# Patient Record
Sex: Female | Born: 1950
Health system: Southern US, Community
[De-identification: ages and names within clinical notes are randomized; demographics above are authoritative.]

## PROBLEM LIST (undated history)

## (undated) DIAGNOSIS — I10 Essential (primary) hypertension: Secondary | ICD-10-CM

## (undated) DIAGNOSIS — E0590 Thyrotoxicosis, unspecified without thyrotoxic crisis or storm: Secondary | ICD-10-CM

## (undated) DIAGNOSIS — E785 Hyperlipidemia, unspecified: Secondary | ICD-10-CM

## (undated) DIAGNOSIS — M1990 Unspecified osteoarthritis, unspecified site: Secondary | ICD-10-CM

## (undated) HISTORY — DX: Essential (primary) hypertension: I10

## (undated) HISTORY — PX: POLYPECTOMY: SHX149

## (undated) HISTORY — DX: Hyperlipidemia, unspecified: E78.5

## (undated) HISTORY — PX: COLONOSCOPY: SHX174

---

## 1998-01-21 ENCOUNTER — Ambulatory Visit: Admission: RE | Admit: 1998-01-21 | Discharge: 1998-01-21 | Payer: Self-pay | Admitting: *Deleted

## 1998-03-02 ENCOUNTER — Encounter: Admission: RE | Admit: 1998-03-02 | Discharge: 1998-03-02 | Payer: Self-pay | Admitting: Hematology and Oncology

## 1998-03-03 ENCOUNTER — Ambulatory Visit: Admission: RE | Admit: 1998-03-03 | Discharge: 1998-03-03 | Payer: Self-pay | Admitting: *Deleted

## 1998-03-03 ENCOUNTER — Encounter: Admission: RE | Admit: 1998-03-03 | Discharge: 1998-03-03 | Payer: Self-pay | Admitting: Hematology and Oncology

## 1998-04-13 ENCOUNTER — Encounter: Admission: RE | Admit: 1998-04-13 | Discharge: 1998-04-13 | Payer: Self-pay | Admitting: Obstetrics & Gynecology

## 1998-04-13 ENCOUNTER — Other Ambulatory Visit: Admission: RE | Admit: 1998-04-13 | Discharge: 1998-04-13 | Payer: Self-pay | Admitting: Obstetrics

## 1999-01-31 ENCOUNTER — Emergency Department (HOSPITAL_COMMUNITY): Admission: EM | Admit: 1999-01-31 | Discharge: 1999-01-31 | Payer: Self-pay | Admitting: Emergency Medicine

## 2000-08-17 ENCOUNTER — Other Ambulatory Visit: Admission: RE | Admit: 2000-08-17 | Discharge: 2000-08-17 | Payer: Self-pay | Admitting: Gynecology

## 2002-01-03 ENCOUNTER — Other Ambulatory Visit: Admission: RE | Admit: 2002-01-03 | Discharge: 2002-01-03 | Payer: Self-pay | Admitting: Gynecology

## 2004-11-09 ENCOUNTER — Other Ambulatory Visit: Admission: RE | Admit: 2004-11-09 | Discharge: 2004-11-09 | Payer: Self-pay | Admitting: Gynecology

## 2004-12-01 ENCOUNTER — Ambulatory Visit: Payer: Self-pay

## 2009-07-26 ENCOUNTER — Ambulatory Visit: Payer: Self-pay | Admitting: Gastroenterology

## 2009-08-06 ENCOUNTER — Telehealth: Payer: Self-pay | Admitting: Gastroenterology

## 2009-08-09 ENCOUNTER — Encounter: Payer: Self-pay | Admitting: Gastroenterology

## 2009-08-09 ENCOUNTER — Ambulatory Visit: Payer: Self-pay | Admitting: Gastroenterology

## 2009-08-10 ENCOUNTER — Encounter: Payer: Self-pay | Admitting: Gastroenterology

## 2010-11-24 NOTE — Procedures (Signed)
Summary: Colonoscopy  Patient: [name] Note: All result statuses are Final unless otherwise noted.  Tests: (1) Colonoscopy (COL)   COL Colonoscopy           DONE     [facility]     [address]     [place], [place]  [address]           COLONOSCOPY PROCEDURE REPORT           PATIENT:  Erica Navarro, Erica Navarro  MR#:  [id number]     BIRTHDATE:  [birth year], 57 yrs. old  GENDER:  female           ENDOSCOPIST:  Vania Rea. Jarold Motto, MD, [facility]     Referred by:  Lesle Chris, M.D.           PROCEDURE DATE:  08/09/2009     PROCEDURE:  Colonoscopy with snare polypectomy     ASA CLASS:  Class II     INDICATIONS:  Routine Risk Screening           MEDICATIONS:   Fentanyl 50 mcg IV, Versed 6 mg IV           DESCRIPTION OF PROCEDURE:   After the risks benefits and     alternatives of the procedure were thoroughly explained, informed     consent was obtained.  Digital rectal exam was performed and     revealed no abnormalities.   The LB CF-[id number] endoscope     was introduced through the anus and advanced to the cecum, which     was identified by both the appendix and ileocecal valve, without     limitations.  The quality of the prep was excellent, using     MoviPrep.  The instrument was then slowly withdrawn as the colon     was fully examined.     <<PROCEDUREIMAGES>>           FINDINGS:  There were multiple polyps identified and removed. in     the rectum and sigmoid colon. They were diminutive. Polyps were     snared, then cauterized with monopolar cautery. Retrieval was     successful. snare polyp  This was otherwise a normal examination     of the colon.   Retroflexed views in the rectum revealed no     abnormalities.    The scope was then withdrawn from the patient     and the procedure completed.           COMPLICATIONS:  None           ENDOSCOPIC IMPRESSION:     1) Polyps, multiple in the rectum and sigmoid colon     2) Otherwise normal examination     probable  hyperplastic polyps.     RECOMMENDATIONS:     1) Follow up colonoscopy in 5 years           REPEAT EXAM:  No           ______________________________     Vania Rea. Jarold Motto, MD, [facility]           CC:           n.     eSIGNED:   Vania Rea. Rasool Rommel at 08/09/2009 09:52 AM           Areta Haber, [id number]  Note: An exclamation mark (!) indicates a result that was not dispersed into the flowsheet. Document Creation Date: 08/09/2009 9:52 AM _______________________________________________________________________  Marland Kitchen  1) Order result status: Final Collection or observation date-time: 08/09/2009 09:42 Requested date-time:  Receipt date-time:  Reported date-time:  Referring Physician:   Ordering Physician: Sheryn Bison [phone]) Specimen Source:  Source: Launa Grill Order Number: [id number] Lab site:   Appended Document: Colonoscopy     Procedures Next Due Date:    Colonoscopy: 07/2012

## 2010-11-24 NOTE — Progress Notes (Signed)
Summary: ? re procedure  Phone Note Call from Patient Call back at [phone]   Caller: Patient Call For: Jarold Motto Reason for Call: Talk to Nurse Summary of Call: Patient has question regarding procedure .... Initial call taken by: Tawni Levy,  August 06, 2009 10:06 AM  Follow-up for Phone Call        Patient asked if she could eat solids the day before her procedure. I explained that she needed to have only clear liquids that day, and that she could have clear liquids til 2 hrs before her procedure. Follow-up by: Clide Cliff RN,  August 06, 2009 12:34 PM

## 2010-11-24 NOTE — Letter (Signed)
Summary: Patient Notice- Polyp Results  [facility]  [address] [place], [place] [address]   Phone: [phone]  Fax: [phone]        August 10, 2009 MRN: [id number]    Erica Navarro [address] [place], [place]  [address]    Dear [name],  I am pleased to inform you that the colon polyp(s) removed during your recent colonoscopy was (were) found to be benign (no cancer detected) upon pathologic examination.  I recommend you have a repeat colonoscopy examination in 3 years to look for recurrent polyps, as having colon polyps increases your risk for having recurrent polyps or even colon cancer in the future. Your polyps were adenomatous polyps and I would suggest followup in 3 years instead of 5.  Should you develop new or worsening symptoms of abdominal pain, bowel habit changes or bleeding from the rectum or bowels, please schedule an evaluation with either your primary care physician or with me.  Additional information/recommendations:  __ No further action with gastroenterology is needed at this time. Please      follow-up with your primary care physician for your other healthcare      needs.  __ Please call [phone] to schedule a return visit to review your      situation.  __ Please keep your follow-up visit as already scheduled.  _xx_ Continue treatment plan as outlined the day of your exam.  Please call us if you are having persistent problems or have questions about your condition that have not been fully answered at this time.  Sincerely,  Mardella Layman MD [facility]  This letter has been electronically signed by your physician.

## 2010-11-24 NOTE — Miscellaneous (Signed)
Summary: [facility]  Clinical Lists Changes  Medications: Added new medication of MOVIPREP 100 GM  SOLR (PEG-KCL-NACL-NASULF-NA ASC-C) As per prep instructions. - Signed Rx of MOVIPREP 100 GM  SOLR (PEG-KCL-NACL-NASULF-NA ASC-C) As per prep instructions.;  #1 x 0;  Signed;  Entered by: Ezra Sites RN;  Authorized by: Mardella Layman MD [facility];  Method used: Electronically to [facility]. [phone]*, [address] [place], [place]  [address], Ph: [phone], Fax: [phone] Allergies: Added new allergy or adverse reaction of * LISINOPRIL Observations: Added new observation of NKA: F (07/26/2009 14:13)    Prescriptions: MOVIPREP 100 GM  SOLR (PEG-KCL-NACL-NASULF-NA ASC-C) As per prep instructions.  #1 x 0   Entered by:   Ezra Sites RN   Authorized by:   Mardella Layman MD [facility]   Signed by:   Ezra Sites RN on 07/26/2009   Method used:   Electronically to        [facility] [address] [phone]* (retail)       [address] [place], [place]  [address]       Ph: [phone]       Fax: [phone]   RxID:   [id number]

## 2012-08-05 ENCOUNTER — Encounter: Payer: Self-pay | Admitting: Gastroenterology

## 2012-09-28 ENCOUNTER — Other Ambulatory Visit: Payer: Self-pay | Admitting: Emergency Medicine

## 2012-09-28 NOTE — Telephone Encounter (Signed)
Please pull paper chart.  

## 2012-09-30 NOTE — Telephone Encounter (Signed)
Chart pulled to PA pool [id number]

## 2012-10-01 ENCOUNTER — Other Ambulatory Visit: Payer: Self-pay | Admitting: *Deleted

## 2012-10-01 MED ORDER — PRAVASTATIN SODIUM 40 MG PO TABS: 40.0000 mg | ORAL_TABLET | Freq: Every day | ORAL | Status: DC

## 2012-11-11 ENCOUNTER — Ambulatory Visit: Payer: Self-pay | Admitting: Family Medicine

## 2012-11-11 VITALS — BP 185/89 | HR 75 | Temp 98.2°F | Resp 17 | Ht 63.0 in | Wt 169.0 lb

## 2012-11-11 DIAGNOSIS — I10 Essential (primary) hypertension: Secondary | ICD-10-CM | POA: Insufficient documentation

## 2012-11-11 DIAGNOSIS — E7800 Pure hypercholesterolemia, unspecified: Secondary | ICD-10-CM | POA: Insufficient documentation

## 2012-11-11 LAB — COMPREHENSIVE METABOLIC PANEL
ALT: 10 U/L (ref 0–35)
AST: 17 U/L (ref 0–37)
Albumin: 4.4 g/dL (ref 3.5–5.2)
Alkaline Phosphatase: 101 U/L (ref 39–117)
BUN: 15 mg/dL (ref 6–23)
CO2: 27 mEq/L (ref 19–32)
Calcium: 10.4 mg/dL (ref 8.4–10.5)
Chloride: 103 mEq/L (ref 96–112)
Creat: 0.63 mg/dL (ref 0.50–1.10)
Glucose, Bld: 109 mg/dL — ABNORMAL HIGH (ref 70–99)
Potassium: 4.5 mEq/L (ref 3.5–5.3)
Sodium: 141 mEq/L (ref 135–145)
Total Bilirubin: 0.6 mg/dL (ref 0.3–1.2)
Total Protein: 7.5 g/dL (ref 6.0–8.3)

## 2012-11-11 MED ORDER — AMLODIPINE BESYLATE 10 MG PO TABS: 10.0000 mg | ORAL_TABLET | Freq: Every day | ORAL | Status: DC

## 2012-11-11 MED ORDER — PRAVASTATIN SODIUM 40 MG PO TABS: 40.0000 mg | ORAL_TABLET | Freq: Every day | ORAL | Status: DC

## 2012-11-11 NOTE — Progress Notes (Signed)
[facility] [address], [place] [address] [phone]  Date:  11/11/2012   Name:  Erica Navarro   DOB:  04/07/1951   MRN:  [id number]  PCP:  Sheryn Bison, MD    Chief Complaint: Medication Refill   History of Present Illness:  Erica Navarro is a 62 y.o. very pleasant female patient who presents with the following:  She has been out of her medications for one week- she takes medications for her BP and for her cholesterol.   She would check her BP when she was on her medication- most recent 130/ 70s.    She has not had any labs in some time- she has financial stressors at this time.    She has not noted any symptoms of HTN- no CP, no HA She has not had a flu shot this year and refused one today There is no problem list on file for this patient.   History reviewed. No pertinent past medical history.  History reviewed. No pertinent past surgical history.  History  Substance Use Topics  . Smoking status: Never Smoker   . Smokeless tobacco: Not on file  . Alcohol Use: No    History reviewed. No pertinent family history.  Allergies  Allergen Reactions  . Lisinopril     REACTION: hands and face swell    Medication list has been reviewed and updated.  Current Outpatient Prescriptions on File Prior to Visit  Medication Sig Dispense Refill  . amLODipine (NORVASC) 10 MG tablet Take 1 tablet (10 mg total) by mouth daily. Needs office visit  30 tablet  0  . pravastatin (PRAVACHOL) 40 MG tablet Take 1 tablet (40 mg total) by mouth daily. Needs office visit  30 tablet  0    Review of Systems:  As per HPI- otherwise negative.   Physical Examination: Filed Vitals:   11/11/12 0908  BP: 185/89  Pulse: 75  Temp: 98.2 F (36.8 C)  Resp: 17   Filed Vitals:   11/11/12 0908  Height: 5\' 3"  (1.6 m)  Weight: 169 lb (76.658 kg)   Body mass index is 29.94 kg/(m^2). Ideal Body Weight: Weight in (lb) to have BMI = 25: 140.8   GEN: WDWN, NAD,  Non-toxic, A & O x 3 HEENT: Atraumatic, Normocephalic. Neck supple. No masses, No LAD. Ears and Nose: No external deformity. CV: RRR, No M/G/R. No JVD. No thrill. No extra heart sounds. PULM: CTA B, no wheezes, crackles, rhonchi. No retractions. No resp. distress. No accessory muscle use. ABD: S, NT, ND EXTR: No c/c/e NEURO Normal gait.  PSYCH: Normally interactive. Conversant. Not depressed or anxious appearing.  Calm demeanor.    Assessment and Plan: 1. HTN (hypertension)  amLODipine (NORVASC) 10 MG tablet, Comprehensive metabolic panel  2. High cholesterol  pravastatin (PRAVACHOL) 40 MG tablet, Comprehensive metabolic panel   Restart her CHL and BP medications today.  Check CMP.  Will defer FLP to another day- will be more accurate when she is back on her CHL medication.  Plan to recheck in a few months, sooner if any concerns.    Abbe Amsterdam, MD

## 2012-11-11 NOTE — Patient Instructions (Addendum)
I will be in touch with your labs- let us know if your blood pressure does not respond to your medication.  Let's try to recheck in about 6 months and check your cholesterol then.

## 2013-02-24 ENCOUNTER — Encounter: Payer: Self-pay | Admitting: Gastroenterology

## 2013-11-10 ENCOUNTER — Telehealth: Payer: Self-pay | Admitting: Emergency Medicine

## 2013-11-10 ENCOUNTER — Ambulatory Visit: Payer: Self-pay | Admitting: Emergency Medicine

## 2013-11-10 VITALS — BP 130/80 | HR 71 | Temp 98.3°F | Resp 16 | Ht 63.0 in | Wt 175.0 lb

## 2013-11-10 DIAGNOSIS — E7800 Pure hypercholesterolemia, unspecified: Secondary | ICD-10-CM

## 2013-11-10 DIAGNOSIS — I10 Essential (primary) hypertension: Secondary | ICD-10-CM

## 2013-11-10 LAB — LIPID PANEL
Cholesterol: 224 mg/dL — ABNORMAL HIGH (ref 0–200)
HDL: 70 mg/dL (ref >39)
LDL Cholesterol: 141 mg/dL — ABNORMAL HIGH (ref 0–99)
Total CHOL/HDL Ratio: 3.2 Ratio
Triglycerides: 63 mg/dL (ref <150)
VLDL: 13 mg/dL (ref 0–40)

## 2013-11-10 LAB — POCT CBC
Granulocyte percent: 58.8 %G (ref 37–80)
HCT, POC: 42.4 % (ref 37.7–47.9)
Hemoglobin: 13.4 g/dL (ref 12.2–16.2)
Lymph, poc: 1.3 (ref 0.6–3.4)
MCH, POC: 28.3 pg (ref 27–31.2)
MCHC: 31.6 g/dL — AB (ref 31.8–35.4)
MCV: 89.5 fL (ref 80–97)
MID (cbc): 0.3 (ref 0–0.9)
MPV: 10.2 fL (ref 0–99.8)
POC Granulocyte: 2.2 (ref 2–6.9)
POC LYMPH PERCENT: 34.6 %L (ref 10–50)
POC MID %: 6.6 %M (ref 0–12)
Platelet Count, POC: 261 10*3/uL (ref 142–424)
RBC: 4.74 M/uL (ref 4.04–5.48)
RDW, POC: 14.8 %
WBC: 3.8 10*3/uL — AB (ref 4.6–10.2)

## 2013-11-10 LAB — COMPREHENSIVE METABOLIC PANEL
ALT: 13 U/L (ref 0–35)
AST: 21 U/L (ref 0–37)
Albumin: 4.3 g/dL (ref 3.5–5.2)
Alkaline Phosphatase: 110 U/L (ref 39–117)
BUN: 16 mg/dL (ref 6–23)
CO2: 26 mEq/L (ref 19–32)
Calcium: 10.5 mg/dL (ref 8.4–10.5)
Chloride: 102 mEq/L (ref 96–112)
Creat: 0.54 mg/dL (ref 0.50–1.10)
Glucose, Bld: 102 mg/dL — ABNORMAL HIGH (ref 70–99)
Potassium: 4 mEq/L (ref 3.5–5.3)
Sodium: 137 mEq/L (ref 135–145)
Total Bilirubin: 0.4 mg/dL (ref 0.3–1.2)
Total Protein: 7.7 g/dL (ref 6.0–8.3)

## 2013-11-10 MED ORDER — AMLODIPINE BESYLATE 10 MG PO TABS: 10.0000 mg | ORAL_TABLET | Freq: Every day | ORAL | Status: DC

## 2013-11-10 MED ORDER — PRAVASTATIN SODIUM 40 MG PO TABS: 40.0000 mg | ORAL_TABLET | Freq: Every day | ORAL | Status: DC

## 2013-11-10 NOTE — Telephone Encounter (Signed)
Please call patient n cholesterol is still high despite being on cholesterol medication. Please be sure she takes her medication every day she works on weight loss and watches her diet regarding cholesterol intake.

## 2013-11-10 NOTE — Progress Notes (Signed)
   Subjective:    Patient ID: Erica Navarro, female    DOB: 1951-10-19, 63 y.o.   MRN: [id number]  HPI  Patient comes into our office today with needs of blood work so she can get a refill on her Amlodipine and Pravastatin She looks well, she states that she feels well , she seems like a friendly lady She has been keeping check on her blood pressure last reading was last week in was 127/80 she has a blood pressure monitor at home.She hasn't ran out of medicine just yet. Was giving a years worth of refills when she saw Dr Lorelei Pont No Hx of past medical history No HX of surgeries Need to get a mammogram this year Didn't get flu shot this year was offered she denied No insurance but will work on getting one lost job back in 06-2012    Review of Systems     Objective:   Physical Exam patient is alert and cooperative she is in no distress. Her neck is supple. Her chest was clear. Heart exam reveals a regular rate with a click noted no murmurs were heard. Extremities are without edema.  Results for orders placed in visit on 11/10/13  POCT CBC      Result Value Range   WBC 3.8 (*) 4.6 - 10.2 K/uL   Lymph, poc 1.3  0.6 - 3.4   POC LYMPH PERCENT 34.6  10 - 50 %L   MID (cbc) 0.3  0 - 0.9   POC MID % 6.6  0 - 12 %M   POC Granulocyte 2.2  2 - 6.9   Granulocyte percent 58.8  37 - 80 %G   RBC 4.74  4.04 - 5.48 M/uL   Hemoglobin 13.4  12.2 - 16.2 g/dL   HCT, POC 42.4  37.7 - 47.9 %   MCV 89.5  80 - 97 fL   MCH, POC 28.3  27 - 31.2 pg   MCHC 31.6 (*) 31.8 - 35.4 g/dL   RDW, POC 14.8     Platelet Count, POC 261  142 - 424 K/uL   MPV 10.2  0 - 99.8 fL        Assessment & Plan:  I advised her to have a mammogram. I advised her that the sooner she is financially able she needs to have a good complete physical exam so she can get up to date on her preventative health care.

## 2013-11-12 NOTE — Telephone Encounter (Signed)
Pt is taking medication each day. Advised her to start an exercise regimen and watch her diet to reduce cholesterol intake. Pt will rtn in 3 months for a recheck.

## 2014-07-24 ENCOUNTER — Encounter: Payer: Self-pay | Admitting: Gastroenterology

## 2014-11-10 ENCOUNTER — Encounter: Payer: Self-pay | Admitting: Family Medicine

## 2014-11-10 ENCOUNTER — Ambulatory Visit (INDEPENDENT_AMBULATORY_CARE_PROVIDER_SITE_OTHER): Payer: Commercial Managed Care - HMO | Admitting: Internal Medicine

## 2014-11-10 VITALS — BP 108/76 | HR 88 | Temp 98.3°F | Resp 16 | Ht 64.0 in | Wt 177.2 lb

## 2014-11-10 DIAGNOSIS — I10 Essential (primary) hypertension: Secondary | ICD-10-CM

## 2014-11-10 DIAGNOSIS — E7800 Pure hypercholesterolemia, unspecified: Secondary | ICD-10-CM

## 2014-11-10 DIAGNOSIS — E785 Hyperlipidemia, unspecified: Secondary | ICD-10-CM

## 2014-11-10 DIAGNOSIS — Z7189 Other specified counseling: Secondary | ICD-10-CM

## 2014-11-10 LAB — COMPREHENSIVE METABOLIC PANEL
ALT: 12 U/L (ref 0–35)
AST: 17 U/L (ref 0–37)
Albumin: 4.5 g/dL (ref 3.5–5.2)
Alkaline Phosphatase: 111 U/L (ref 39–117)
BUN: 14 mg/dL (ref 6–23)
CO2: 28 mEq/L (ref 19–32)
Calcium: 10.8 mg/dL — ABNORMAL HIGH (ref 8.4–10.5)
Chloride: 102 mEq/L (ref 96–112)
Creat: 0.65 mg/dL (ref 0.50–1.10)
Glucose, Bld: 99 mg/dL (ref 70–99)
Potassium: 4.6 mEq/L (ref 3.5–5.3)
Sodium: 139 mEq/L (ref 135–145)
Total Bilirubin: 0.6 mg/dL (ref 0.2–1.2)
Total Protein: 8.2 g/dL (ref 6.0–8.3)

## 2014-11-10 LAB — POCT CBC
Granulocyte percent: 56.9 %G (ref 37–80)
HCT, POC: 42.8 % (ref 37.7–47.9)
Hemoglobin: 14 g/dL (ref 12.2–16.2)
Lymph, poc: 2 (ref 0.6–3.4)
MCH, POC: 28.3 pg (ref 27–31.2)
MCHC: 32.7 g/dL (ref 31.8–35.4)
MCV: 86.5 fL (ref 80–97)
MID (cbc): 0.3 (ref 0–0.9)
MPV: 8.7 fL (ref 0–99.8)
POC Granulocyte: 3.1 (ref 2–6.9)
POC LYMPH PERCENT: 37.5 %L (ref 10–50)
POC MID %: 5.6 %M (ref 0–12)
Platelet Count, POC: 282 10*3/uL (ref 142–424)
RBC: 4.95 M/uL (ref 4.04–5.48)
RDW, POC: 15.3 %
WBC: 5.4 10*3/uL (ref 4.6–10.2)

## 2014-11-10 LAB — LIPID PANEL
Cholesterol: 221 mg/dL — ABNORMAL HIGH (ref 0–200)
HDL: 63 mg/dL (ref >39)
LDL Cholesterol: 140 mg/dL — ABNORMAL HIGH (ref 0–99)
Total CHOL/HDL Ratio: 3.5 Ratio
Triglycerides: 89 mg/dL (ref <150)
VLDL: 18 mg/dL (ref 0–40)

## 2014-11-10 LAB — TSH: TSH: 1.358 u[IU]/mL (ref 0.350–4.500)

## 2014-11-10 LAB — POCT UA - MICROSCOPIC ONLY
Casts, Ur, LPF, POC: NEGATIVE
Crystals, Ur, HPF, POC: NEGATIVE
Epithelial cells, urine per micros: NEGATIVE
Mucus, UA: NEGATIVE
Yeast, UA: NEGATIVE

## 2014-11-10 MED ORDER — PRAVASTATIN SODIUM 40 MG PO TABS: 40.0000 mg | ORAL_TABLET | Freq: Every day | ORAL | Status: DC

## 2014-11-10 MED ORDER — AMLODIPINE BESYLATE 10 MG PO TABS: 10.0000 mg | ORAL_TABLET | Freq: Every day | ORAL | Status: DC

## 2014-11-10 NOTE — Progress Notes (Signed)
   Subjective:    Patient ID: Erica Navarro, female    DOB: Mar 17, 1951, 64 y.o.   MRN: [id number]  HPI Doing well. Has HTN and high lipids, tolerates meds no side affects. Also hx of dry skin with itching. Helped by moisturizers. Has seen derm.   Review of Systems     Objective:   Physical Exam  Constitutional: She is oriented to person, place, and time. She appears well-developed and well-nourished. No distress.  HENT:  Head: Normocephalic.  Right Ear: External ear normal.  Left Ear: External ear normal.  Eyes: Conjunctivae and EOM are normal. Pupils are equal, round, and reactive to light. No scleral icterus.  Neck: Normal range of motion. Neck supple.  Cardiovascular: Normal rate, regular rhythm and normal heart sounds.   Pulmonary/Chest: Effort normal and breath sounds normal.  Musculoskeletal: Normal range of motion.  Neurological: She is alert and oriented to person, place, and time. She exhibits normal muscle tone. Coordination normal.  Psychiatric: She has a normal mood and affect. Her behavior is normal. Judgment and thought content normal.  Vitals reviewed.         Assessment & Plan:  Meds refilled Appt 6 months Dr. Ellison Carwin

## 2014-11-10 NOTE — Patient Instructions (Signed)
Hypertension Hypertension, commonly called high blood pressure, is when the force of blood pumping through your arteries is too strong. Your arteries are the blood vessels that carry blood from your heart throughout your body. A blood pressure reading consists of a higher number over a lower number, such as 110/72. The higher number (systolic) is the pressure inside your arteries when your heart pumps. The lower number (diastolic) is the pressure inside your arteries when your heart relaxes. Ideally you want your blood pressure below 120/80. Hypertension forces your heart to work harder to pump blood. Your arteries may become narrow or stiff. Having hypertension puts you at risk for heart disease, stroke, and other problems.  RISK FACTORS Some risk factors for high blood pressure are controllable. Others are not.  Risk factors you cannot control include:   Race. You may be at higher risk if you are African American.  Age. Risk increases with age.  Gender. Men are at higher risk than women before age 45 years. After age 65, women are at higher risk than men. Risk factors you can control include:  Not getting enough exercise or physical activity.  Being overweight.  Getting too much fat, sugar, calories, or salt in your diet.  Drinking too much alcohol. SIGNS AND SYMPTOMS Hypertension does not usually cause signs or symptoms. Extremely high blood pressure (hypertensive crisis) may cause headache, anxiety, shortness of breath, and nosebleed. DIAGNOSIS  To check if you have hypertension, your health care provider will measure your blood pressure while you are seated, with your arm held at the level of your heart. It should be measured at least twice using the same arm. Certain conditions can cause a difference in blood pressure between your right and left arms. A blood pressure reading that is higher than normal on one occasion does not mean that you need treatment. If one blood pressure reading  is high, ask your health care provider about having it checked again. TREATMENT  Treating high blood pressure includes making lifestyle changes and possibly taking medicine. Living a healthy lifestyle can help lower high blood pressure. You may need to change some of your habits. Lifestyle changes may include:  Following the DASH diet. This diet is high in fruits, vegetables, and whole grains. It is low in salt, red meat, and added sugars.  Getting at least 2 hours of brisk physical activity every week.  Losing weight if necessary.  Not smoking.  Limiting alcoholic beverages.  Learning ways to reduce stress. If lifestyle changes are not enough to get your blood pressure under control, your health care provider may prescribe medicine. You may need to take more than one. Work closely with your health care provider to understand the risks and benefits. HOME CARE INSTRUCTIONS  Have your blood pressure rechecked as directed by your health care provider.   Take medicines only as directed by your health care provider. Follow the directions carefully. Blood pressure medicines must be taken as prescribed. The medicine does not work as well when you skip doses. Skipping doses also puts you at risk for problems.   Do not smoke.   Monitor your blood pressure at home as directed by your health care provider. SEEK MEDICAL CARE IF:   You think you are having a reaction to medicines taken.  You have recurrent headaches or feel dizzy.  You have swelling in your ankles.  You have trouble with your vision. SEEK IMMEDIATE MEDICAL CARE IF:  You develop a severe headache or confusion.    You have unusual weakness, numbness, or feel faint.  You have severe chest or abdominal pain.  You vomit repeatedly.  You have trouble breathing. MAKE SURE YOU:   Understand these instructions.  Will watch your condition.  Will get help right away if you are not doing well or get worse. Document  Released: 10/09/2005 Document Revised: 02/23/2014 Document Reviewed: 08/01/2013 [facility] Patient Information 2015 [employer], [place]. This information is not intended to replace advice given to you by your health care provider. Make sure you discuss any questions you have with your health care provider. Cholesterol Cholesterol is a white, waxy, fat-like substance needed by your body in small amounts. The liver makes all the cholesterol you need. Cholesterol is carried from the liver by the blood through the blood vessels. Deposits of cholesterol (plaque) may build up on blood vessel walls. These make the arteries narrower and stiffer. Cholesterol plaques increase the risk for heart attack and stroke.  You cannot feel your cholesterol level even if it is very high. The only way to know it is high is with a blood test. Once you know your cholesterol levels, you should keep a record of the test results. Work with your health care provider to keep your levels in the desired range.  WHAT DO THE RESULTS MEAN?  Total cholesterol is a rough measure of all the cholesterol in your blood.   LDL is the so-called bad cholesterol. This is the type that deposits cholesterol in the walls of the arteries. You want this level to be low.   HDL is the good cholesterol because it cleans the arteries and carries the LDL away. You want this level to be high.  Triglycerides are fat that the body can either burn for energy or store. High levels are closely linked to heart disease.  WHAT ARE THE DESIRED LEVELS OF CHOLESTEROL?  Total cholesterol below 200.   LDL below 100 for people at risk, below 70 for those at very high risk.   HDL above 50 is good, above 60 is best.   Triglycerides below 150.  HOW CAN I LOWER MY CHOLESTEROL?  Diet. Follow your diet programs as directed by your health care provider.   Choose fish or white meat chicken and [place], roasted or baked. Limit fatty cuts of red meat, fried foods,  and processed meats, such as sausage and lunch meats.   Eat lots of fresh fruits and vegetables.  Choose whole grains, beans, pasta, potatoes, and cereals.   Use only small amounts of olive, corn, or canola oils.   Avoid butter, mayonnaise, shortening, or palm kernel oils.  Avoid foods with trans fats.   Drink skim or nonfat milk and eat low-fat or nonfat yogurt and cheeses. Avoid whole milk, cream, ice cream, egg yolks, and full-fat cheeses.   Healthy desserts include angel food cake, ginger snaps, animal crackers, hard candy, popsicles, and low-fat or nonfat frozen yogurt. Avoid pastries, cakes, pies, and cookies.   Exercise. Follow your exercise programs as directed by your health care provider.   A regular program helps decrease LDL and raise HDL.   A regular program helps with weight control.   Do things that increase your activity level like gardening, walking, or taking the stairs. Ask your health care provider about how you can be more active in your daily life.   Medicine. Take medicine only as directed by your health care provider.   Medicine may be prescribed by your health care provider to help lower  cholesterol and decrease the risk for heart disease.   If you have several risk factors, you may need medicine even if your levels are normal. Document Released: 07/04/2001 Document Revised: 02/23/2014 Document Reviewed: 07/23/2013 [facility] Patient Information 2015 [place], [place]. This information is not intended to replace advice given to you by your health care provider. Make sure you discuss any questions you have with your health care provider.

## 2014-11-24 ENCOUNTER — Ambulatory Visit (INDEPENDENT_AMBULATORY_CARE_PROVIDER_SITE_OTHER): Payer: Commercial Managed Care - HMO | Admitting: Family Medicine

## 2014-11-24 DIAGNOSIS — E213 Hyperparathyroidism, unspecified: Secondary | ICD-10-CM

## 2014-11-24 NOTE — Patient Instructions (Signed)
Your calcium level was a little high at your last visit- this may indicate a problem or may be just a fluke.  We will recheck this today and I will run a parathyroid level to make sure there is no issue with your parathyroid gland.  I will be in touch with you as soon as your labs come in.   As we discussed your LDL cholesterol is a little bit high, but your overall ratio is ok.  Continue your cholesterol medication and we can recheck in one year

## 2014-11-24 NOTE — Progress Notes (Addendum)
[facility] [address] [place] [address] [phone]  Date:  11/24/2014   Name:  Erica Navarro   DOB:  1951/03/05   MRN:  [id number]  PCP:  Verl Blalock, MD    Chief Complaint: Follow-up   History of Present Illness:  Erica Navarro is a 64 y.o. very pleasant female patient who presents with the following:  She was here on 1/19 and seen by Dr. Elder Cyphers for a periodic recheck .  She was noted to have hyperlipidemia and elevated calcium level so she was asked to come in for recheck today.   She has never had this issue in the past, she is not taking any calcium supplementation She is on pravachol for her cholesterol- her ratio is not any worse than in the past is and is under 4.0.  Her LDL is high but OW cholesterol is ok,  She is not diabetic.  She is doing well with her current dose of pravachol  Patient Active Problem List   Diagnosis Date Noted  . HTN (hypertension) 11/11/2012  . High cholesterol 11/11/2012    Past Medical History  Diagnosis Date  . Hyperlipidemia   . Hypertension     History reviewed. No pertinent past surgical history.  History  Substance Use Topics  . Smoking status: Never Smoker   . Smokeless tobacco: Not on file  . Alcohol Use: No    History reviewed. No pertinent family history.  Allergies  Allergen Reactions  . Lisinopril     REACTION: hands and face swell    Medication list has been reviewed and updated.  Current Outpatient Prescriptions on File Prior to Visit  Medication Sig Dispense Refill  . amLODipine (NORVASC) 10 MG tablet Take 1 tablet (10 mg total) by mouth daily. 90 tablet 3  . pravastatin (PRAVACHOL) 40 MG tablet Take 1 tablet (40 mg total) by mouth daily. 90 tablet 3   No current facility-administered medications on file prior to visit.    Review of Systems:  As per HPI- otherwise negative.   Physical Examination: Filed Vitals:   11/24/14 0956  BP: 128/80  Pulse: 71  Temp: 98.9 F (37.2  C)  Resp: 18   Filed Vitals:   11/24/14 0956  Height: 5' 3.75" (1.619 m)  Weight: 178 lb 9.6 oz (81.012 kg)   Body mass index is 30.91 kg/(m^2). Ideal Body Weight: Weight in (lb) to have BMI = 25: 144.2  GEN: WDWN, NAD, Non-toxic, A & O x 3, overweight, looks well HEENT: Atraumatic, Normocephalic. Neck supple. No masses, No LAD. Ears and Nose: No external deformity. CV: RRR, No M/G/R. No JVD. No thrill. No extra heart sounds. PULM: CTA B, no wheezes, crackles, rhonchi. No retractions. No resp. distress. No accessory muscle use. EXTR: No c/c/e NEURO Normal gait.  PSYCH: Normally interactive. Conversant. Not depressed or anxious appearing.  Calm demeanor.    Assessment and Plan: Hypercalcemia - Plan: PTH, Intact and Calcium  Discussed her cholesterol.  She is a relatively low risk person and her level is stable.  Will continue current dose of pravachol, encouraged exercise and recheck in 1 year Will check her calcium and PTH levels- if still abnormal will pursue as needed   Signed Lamar Blinks, MD  Called 2/3- her PTH and calcium levels are up. Will refer to endocrinology for hyperparathyroidism.  She states understanding, is asked to call if she does not hear about this appt   Results for orders placed or  performed in visit on 11/24/14  PTH, Intact and Calcium  Result Value Ref Range   PTH 104 (H) 14 - 64 pg/mL   Calcium 10.7 (H) 8.4 - 10.5 mg/dL   Hypercalcemia - Plan: PTH, Intact and Calcium, Ambulatory referral to Endocrinology  Hyperparathyroidism - Plan: Ambulatory referral to Endocrinology

## 2014-11-25 LAB — PTH, INTACT AND CALCIUM
Calcium: 10.7 mg/dL — ABNORMAL HIGH (ref 8.4–10.5)
PTH: 104 pg/mL — ABNORMAL HIGH (ref 14–64)

## 2014-11-25 NOTE — Addendum Note (Signed)
Addended by: Lamar Blinks C on: 11/25/2014 03:11 PM   Modules accepted: Orders

## 2014-12-04 ENCOUNTER — Ambulatory Visit (INDEPENDENT_AMBULATORY_CARE_PROVIDER_SITE_OTHER): Payer: Commercial Managed Care - HMO | Admitting: Internal Medicine

## 2014-12-04 ENCOUNTER — Encounter: Payer: Self-pay | Admitting: Internal Medicine

## 2014-12-04 LAB — BASIC METABOLIC PANEL
BUN: 16 mg/dL (ref 6–23)
CO2: 31 mEq/L (ref 19–32)
Calcium: 10.9 mg/dL — ABNORMAL HIGH (ref 8.4–10.5)
Chloride: 105 mEq/L (ref 96–112)
Creatinine, Ser: 0.63 mg/dL (ref 0.40–1.20)
GFR: 121.86 mL/min (ref >60.00)
Glucose, Bld: 98 mg/dL (ref 70–99)
Potassium: 4.2 mEq/L (ref 3.5–5.1)
Sodium: 139 mEq/L (ref 135–145)

## 2014-12-04 LAB — PHOSPHORUS: Phosphorus: 2.7 mg/dL (ref 2.3–4.6)

## 2014-12-04 LAB — MAGNESIUM: Magnesium: 1.9 mg/dL (ref 1.5–2.5)

## 2014-12-04 LAB — VITAMIN D 25 HYDROXY (VIT D DEFICIENCY, FRACTURES): VITD: 37.83 ng/mL (ref 30.00–100.00)

## 2014-12-04 NOTE — Progress Notes (Addendum)
Patient ID: Erica Navarro, female   DOB: 05-24-1951, 64 y.o.   MRN: [id number]   HPI  Erica Navarro is a 64 y.o.-year-old female, referred by her PCP, Dr. Lorelei Pont, for evaluation for hypercalcemia/hyperparathyroidism.  Pt was dx with hypercalcemia in 10/2014. I reviewed pt's pertinent labs: Lab Results  Component Value Date   PTH 104* 11/24/2014   CALCIUM 10.7* 11/24/2014   CALCIUM 10.8* 11/10/2014   CALCIUM 10.5 11/10/2013   CALCIUM 10.4 11/11/2012   No DEXA scans recently - none available for review. She tells me she had one long ago...  No fractures or falls.   No h/o kidney stones.  No h/o CKD. Last BUN/Cr: Lab Results  Component Value Date   BUN 14 11/10/2014   CREATININE 0.65 11/10/2014   Pt is not on HCTZ.  No h/o vitamin D deficiency. No vit D level available for review.  Pt is not on calcium, takes vitamin D 1000 IU daily - started >5 years ago; she  does eat dairy and green, leafy, vegetables.  Pt does not have a FH of hypercalcemia, pituitary tumors, thyroid cancer, or osteoporosis.   I reviewed her chart and she also has a history of HTN, HL,   ROS: Constitutional: no weight gain/loss, no fatigue, + subjective hyperthermia (hot flushes), + nocturia Eyes: no blurry vision, no xerophthalmia ENT: no sore throat, no nodules palpated in throat, no dysphagia/odynophagia, no hoarseness Cardiovascular: no CP/SOB/palpitations/leg swelling Respiratory: no cough/SOB Gastrointestinal: no N/V/D/C, + acid reflux Musculoskeletal: +  muscle/+ joint aches Skin: no rashes, + easy bruising, + itching Neurological: no tremors/numbness/tingling/dizziness Psychiatric: no depression/anxiety  Past Medical History  Diagnosis Date  . Hyperlipidemia   . Hypertension    History reviewed. No pertinent past surgical history. History   Social History  . Marital Status: Single    Spouse Name: N/A  . Number of Children: 4   Occupational History  . janitor   Social History Main  Topics  . Smoking status: Never Smoker   . Smokeless tobacco: Not on file  . Alcohol Use: No  . Drug Use: No   Current Outpatient Prescriptions on File Prior to Visit  Medication Sig Dispense Refill  . amLODipine (NORVASC) 10 MG tablet Take 1 tablet (10 mg total) by mouth daily. 90 tablet 3  . pravastatin (PRAVACHOL) 40 MG tablet Take 1 tablet (40 mg total) by mouth daily. 90 tablet 3   No current facility-administered medications on file prior to visit.   Allergies  Allergen Reactions  . Lisinopril     REACTION: hands and face swell   FH: - see HPI + - HTN and heart ds in mother - HL in sister  PE: BP 136/84 mmHg  Pulse 77  Temp(Src) 97.5 F (36.4 C) (Oral)  Resp 16  Ht 5\' 3"  (1.6 m)  Wt 176 lb (79.833 kg)  BMI 31.18 kg/m2  SpO2 96% Wt Readings from Last 3 Encounters:  12/04/14 176 lb (79.833 kg)  11/24/14 178 lb 9.6 oz (81.012 kg)  11/10/14 177 lb 3.2 oz (80.377 kg)   Constitutional: overweight, in NAD. No kyphosis. Eyes: PERRLA, EOMI, no exophthalmos ENT: moist mucous membranes, no thyromegaly, no cervical lymphadenopathy Cardiovascular: RRR, No MRG Respiratory: CTA B Gastrointestinal: abdomen soft, NT, ND, BS+ Musculoskeletal: no deformities, strength intact in all 4 Skin: moist, warm, no rashes Neurological: no tremor with outstretched hands, DTR normal in all 4  Assessment: 1. Hypercalcemia/hyperparathyroidism  Plan: Patient has had several instances of elevated calcium, with the  highest level being at 10.8. An intact PTH level was also high, at 104 for a Ca of 10.7  It is unclear whether she has vitamin D deficiency >> we will need to check. No apparent complications from hypercalcemia: no h/o nephrolithiasis, no osteoporosis, no fractures. No abdominal pain, depression, bone pain. She has joint pain, chronic. - I discussed with the patient about the physiology of calcium and parathyroid hormone, and possible side effects from increased PTH, including  kidney stones, osteoporosis, abdominal pain, etc.  - We discussed that we need to check whether his hyperparathyroidism is primary (Familial hypercalcemic hypocalciuria or parathyroid adenoma) or secondary (to conditions like: vitamin D deficiency, calcium malabsorption, hypercalciuria, renal insufficiency, etc.). - we discussed about criteria for parathyroid surgery:  Increased calcium by more than 1 mg/dL above the upper limit of normal  Kidney ds.  Osteoporosis (or Vb fx) Age <16 years old New (2013): High UCa >400 mg/d and increased stone risk by biochemical stone risk analysis Presence of nephrolithiasis or nephrocalcinosis Pt's preference!  - she does not meet the criteria as of now, so we may need to check a DEXA scan to see if she has osteoporosis. I would add a 33% distal radius for evaluation of cortical bone.  - I will also check: calcium level intact PTH ([facility]) Magnesium Phosphorus vitamin D- 25 HO and 1,25 HO  24h urinary calcium/creatinine ratio - if vit D normal - pt given instructions for urine collection and the jug - If the tests indicate a parathyroid adenoma, I will refer her to surgery. We discussed possible consequences of hyperparathyroidism: ~1/3 pts will develop complications over 15 years (OP, nephrolithiasis). I will wait for the results of the above labs and will discuss with the plan with the patient.  - I will see the patient back in 6 months, I will discuss through my chart or by phone  - time spent with the patient: 1 hour, of which >50% was spent in obtaining information about her high calcium and PTH level, reviewing her previous labs and evaluations, counseling her about her condition (please see the discussed topics above), and developing a plan to further investigate it. She and her daughter had a number of questions which I addressed.  Component     Latest Ref Rng 12/04/2014  Sodium     135 - 145 mEq/L 139  Potassium     3.5 - 5.1 mEq/L 4.2   Chloride     96 - 112 mEq/L 105  CO2     19 - 32 mEq/L 31  Glucose     70 - 99 mg/dL 98  BUN     6 - 23 mg/dL 16  Creatinine     0.40 - 1.20 mg/dL 0.63  Calcium     8.4 - 10.5 mg/dL 10.9 (H)  GFR     >60.00 mL/min 121.86  Vitamin D 1, 25 (OH) Total     18 - 72 pg/mL 96 (H)  Vitamin D3 1, 25 (OH)      96  Vitamin D2 1, 25 (OH)      <8  PTH     15 - 65 pg/mL 82 (H)  Calcium Ionized     1.12 - 1.32 mmol/L 1.50 (H)  VITD     30.00 - 100.00 ng/mL 37.83  Phosphorus     2.3 - 4.6 mg/dL 2.7  Magnesium     1.5 - 2.5 mg/dL 1.9   Labs above consistent with primary  hyperparathyroidism.  Component     Latest Ref Rng 01/11/2015 01/11/2015         3:03 PM  3:07 PM  Calcium, Ur      4   Calcium, 24 hour urine     100 - 250 mg/day 112   Creatinine, Urine      50.4 50.5  Creatinine, 24H Ur     700 - 1800 mg/day 1412 1415   24h calcium not elevated. FeCa= 0.0045 (0.45%). This is lower than expected with primary hyperparathyroidism, however, I have low suspicion for familial hypocalciuric hypercalcemia due to previously normal calcium levels and also normal magnesium levels. I think she would still benefit from a referral to surgery and maybe localization studies. I will discuss with the patient.

## 2014-12-04 NOTE — Patient Instructions (Signed)
Please stop at the lab.  If the vitamin D is normal, we will need a 24h urine collection: Patient information (Up-to-Date): Collection of a 24-hour urine specimen  - You should collect every drop of urine during each 24-hour period. It does not matter how much or little urine is passed each time, as long as every drop is collected. - Begin the urine collection in the morning after you wake up, after you have emptied your bladder for the first time. - Urinate (empty the bladder) for the first time and flush it down the toilet. Note the exact time (eg, 6:15 AM). You will begin the urine collection at this time. - Collect every drop of urine during the day and night in an empty collection bottle. Store the bottle at room temperature or in the refrigerator. - If you need to have a bowel movement, any urine passed with the bowel movement should be collected. Try not to include feces with the urine collection. If feces does get mixed in, do not try to remove the feces from the urine collection bottle. - Finish by collecting the first urine passed the next morning, adding it to the collection bottle. This should be within ten minutes before or after the time of the first morning void on the first day (which was flushed). In this example, you would try to void between 6:05 and 6:25 on the second day. - If you need to urinate one hour before the final collection time, drink a full glass of water so that you can void again at the appropriate time. If you have to urinate 20 minutes before, try to hold the urine until the proper time. - Please note the exact time of the final collection, even if it is not the same time as when collection began on day 1. - The bottle(s) may be kept at room temperature for a day or two, but should be kept cool or refrigerated for longer periods of time.  Please come back for a follow-up appointment in 6 months  Hypercalcemia Hypercalcemia means the calcium in your blood is too  high. Calcium in our blood is important for the control of many things, such as:  Blood clotting.  Conducting of nerve impulses.  Muscle contraction.  Maintaining teeth and bone health.  Other body functions. In the bloodstream, calcium maintains a constant balance with another mineral, phosphate. Calcium is absorbed into the body through the small intestine. This is helped by vitamin D. Calcium levels are maintained mostly by vitamin D and a hormone (parathyroid hormone). But the kidneys also help. Hypercalcemia can happen when the concentration of calcium is too high for the kidneys to maintain balance. The body maintains a balance between the calcium we eat and the calcium already in our body. If calcium intake is increased or we cannot use calcium properly, there may be problems. Some common sources of calcium are:   Dairy products.  Nuts.  Eggs.  Whole grains.  Legumes.  Green leafy vegetables. CAUSES There are many causes of this condition, but some common ones are:  Hyperparathyroidism. This is an overactivity of the parathyroid gland.  Cancers of the breast, kidney, lung, head, and neck are common causes of calcium increases.  Medications that cause you to urinate more often (diuretics), nausea, vomiting, and diarrhea also increase the calcium in the blood.  Overuse of calcium-containing antacids. SYMPTOMS  Many patients with mild hypercalcemia have no symptoms. For those with symptoms, common problems include:  Loss  of appetite.  Constipation.  Increased thirst.  Heart rhythm changes.  Abnormal thinking.  Nausea.  Abdominal pain.  Kidney stones.  Mood swings.  Coma and death when severe.  Vomiting.  Increased urination.  High blood pressure.  Confusion. DIAGNOSIS   Your caregiver will do a medical history and perform a physical exam on you.  Calcium and parathyroid hormone (PTH) may be measured with a blood test. TREATMENT   The  treatment depends on the calcium level and what is causing the higher level. Hypercalcemia can be life threatening. Fast lowering of the calcium level may be necessary.  With normal kidney function, fluids can be given by vein to clear the excess calcium. Hemodialysis works well to reduce dangerous calcium levels if there is poor kidney function. This is a procedure in which a machine is used to filter out unwanted substances. The blood is then returned to the body.  Drugs, such as diuretics, can be given after adequate fluid intake is established. These medications help the kidneys get rid of extra calcium. Drugs that lessen (inhibit) bone loss are helpful in gaining long-term control. Phosphate pills help lower high calcium levels caused by a low supply of phosphate. Anti-inflammatory agents such as steroids are helpful with some cancers and toxic levels of vitamin D.  Treatment of the underlying cause of the hypercalcemia will also correct the imbalance. Hyperparathyroidism is usually treated by surgical removal of one or more of the parathyroid glands and any tissue, other than the glands themselves, that is producing too much hormone.  The hypercalcemia caused by cancer is difficult to treat without controlling the cancer. Symptoms can be improved with fluids and drug therapy as outlined above. PROGNOSIS   Surgery to remove the parathyroid glands is usually successful. This also depends on the amount of damage to the kidneys and whether or not it can be treated.  Mild hypercalcemia can be controlled with good fluid intake and the use of effective medications.  Hypercalcemia often develops as a late complication of cancer. The expected outlook is poor without effective anticancer therapy. PREVENTION   If you are at risk for developing hypercalcemia, be familiar with early symptoms. Report these to your caregiver.  Good fluid intake (up to four quarts of liquid a day if possible) is  helpful.  Try to control nausea and vomiting, and treat fevers to avoid dehydration.  Lowering the amount of calcium in your diet is not necessary. High blood calcium reduces absorption of calcium in the intestine.  Stay as active as possible. SEEK IMMEDIATE MEDICAL CARE IF:   You develop chest pain, sweating, or shortness of breath.  You get confused, feel faint or pass out.  You develop severe nausea and vomiting. MAKE SURE YOU:   Understand these instructions.  Will watch your condition.  Will get help right away if you are not doing well or get worse. Document Released: 12/23/2004 Document Revised: 02/23/2014 Document Reviewed: 10/04/2010 [facility] Patient Information 2015 [place], [place]. This information is not intended to replace advice given to you by your health care provider. Make sure you discuss any questions you have with your health care provider.

## 2014-12-05 LAB — CALCIUM, IONIZED: Calcium, Ion: 1.5 mmol/L — ABNORMAL HIGH (ref 1.12–1.32)

## 2014-12-05 LAB — PARATHYROID HORMONE, INTACT (NO CA): PTH: 82 pg/mL — ABNORMAL HIGH (ref 15–65)

## 2014-12-09 LAB — VITAMIN D 1,25 DIHYDROXY
Vitamin D 1, 25 (OH)2 Total: 96 pg/mL — ABNORMAL HIGH (ref 18–72)
Vitamin D2 1, 25 (OH)2: <8 pg/mL
Vitamin D3 1, 25 (OH)2: 96 pg/mL

## 2014-12-17 ENCOUNTER — Encounter: Payer: Self-pay | Admitting: Internal Medicine

## 2014-12-28 ENCOUNTER — Other Ambulatory Visit: Payer: Commercial Managed Care - HMO

## 2015-01-05 ENCOUNTER — Ambulatory Visit (INDEPENDENT_AMBULATORY_CARE_PROVIDER_SITE_OTHER): Payer: Commercial Managed Care - HMO

## 2015-01-05 ENCOUNTER — Ambulatory Visit (INDEPENDENT_AMBULATORY_CARE_PROVIDER_SITE_OTHER): Payer: Commercial Managed Care - HMO | Admitting: Family Medicine

## 2015-01-05 ENCOUNTER — Telehealth: Payer: Self-pay | Admitting: *Deleted

## 2015-01-05 VITALS — BP 144/80 | HR 77 | Temp 97.5°F | Resp 16 | Ht 63.75 in | Wt 177.0 lb

## 2015-01-05 DIAGNOSIS — M25561 Pain in right knee: Secondary | ICD-10-CM | POA: Diagnosis not present

## 2015-01-05 DIAGNOSIS — S8391XA Sprain of unspecified site of right knee, initial encounter: Secondary | ICD-10-CM | POA: Diagnosis not present

## 2015-01-05 MED ORDER — MELOXICAM 15 MG PO TABS: 15.0000 mg | ORAL_TABLET | Freq: Every day | ORAL | Status: DC

## 2015-01-05 NOTE — Telephone Encounter (Signed)
Called pt and lvm advising her to return for another container to do another 24 hr urine collection. There was a leak in the container and the lab was unable to get the correct amount to perform the test.

## 2015-01-05 NOTE — Progress Notes (Addendum)
Subjective:    Patient ID: Erica Navarro, female    DOB: 05-Mar-1951, 64 y.o.   MRN: [id number] This chart was scribed for Delman Cheadle, MD by Zola Button, Medical Scribe. This patient was seen in Room 10 and the patient's care was started at 11:24 AM.   Chief Complaint  Patient presents with  . Leg Injury    HPI HPI Comments: Erica Navarro is a 65 y.o. female who [facility] complaining of right knee pain with some swelling above her knee cap that started 5 days ago. The pain does go to her upper calf. She did trip while cleaning the house, but she is unsure if the pain is due to tripping or overuse. The pain is worse when going down stairs; she has been leading with her left leg due to the pain. Patient has used epsom salt soaks and got a neoprene knee brace last night. She denies heartburn symptoms and hx of knee problems or knee injuries. She is still taking Glucosamine.  Seeing Dr. Cruzita Lederer in workup for her hypercalcemia, concern for hyperparathyroidism. Patient is currently needing to collect a 24 hour urine collection for futher evaluation of primary vs. secondary hyperparathyroidism. Labs last month were otherwise normal with normal vitamin D, magnesium and phosphate. She is to follow up with Dr. Cruzita Lederer in 6 months.   Past Medical History  Diagnosis Date  . Hyperlipidemia   . Hypertension    Current Outpatient Prescriptions on File Prior to Visit  Medication Sig Dispense Refill  . amLODipine (NORVASC) 10 MG tablet Take 1 tablet (10 mg total) by mouth daily. 90 tablet 3  . aspirin 81 MG tablet Take 81 mg by mouth daily.    . Cholecalciferol 1000 UNITS capsule Take 1,000 Units by mouth daily.    [name] (GLUCOSAMINE MSM COMPLEX) TABS Take 1 tablet by mouth 2 (two) times daily.    . Omega-3 Fatty Acids (FISH OIL) 1000 MG CAPS Take 1,000 mg by mouth 2 (two) times daily.    . pravastatin (PRAVACHOL) 40 MG tablet Take 1 tablet (40  mg total) by mouth daily. 90 tablet 3   No current facility-administered medications on file prior to visit.   Allergies  Allergen Reactions  . Lisinopril     REACTION: hands and face swell    Review of Systems  Constitutional: Positive for activity change.  Cardiovascular: Positive for leg swelling. Negative for chest pain.  Gastrointestinal: Negative for abdominal pain.  Musculoskeletal: Positive for myalgias, joint swelling, arthralgias and gait problem.  Skin: Negative for rash and wound.       Objective:  BP 144/80 mmHg  Pulse 77  Temp(Src) 97.5 F (36.4 C) (Oral)  Resp 16  Ht 5' 3.75" (1.619 m)  Wt 177 lb (80.287 kg)  BMI 30.63 kg/m2  SpO2 97%  Physical Exam  Constitutional: She is oriented to person, place, and time. She appears well-developed and well-nourished. No distress.  HENT:  Head: Normocephalic and atraumatic.  Mouth/Throat: Oropharynx is clear and moist. No oropharyngeal exudate.  Eyes: Pupils are equal, round, and reactive to light.  Neck: Neck supple.  Cardiovascular: Normal rate.   Pulmonary/Chest: Effort normal.  Musculoskeletal: She exhibits tenderness. She exhibits no edema.  Moderate crepitus in left knee, none in the right. Right knee: Pain in posterior calf and suprapatellar area. No appreciable edema, but possible mild effusion suprapatellar. No tenderness along medial and lateral joint line. Full ROM. No pain with varus  or valgus stress. Pain at [facility] and LCL upon palpation. Positive McMurray's. Negative posterior and anterior drawer.   Neurological: She is alert and oriented to person, place, and time. No cranial nerve deficit.  Skin: Skin is warm and dry. No rash noted.  Psychiatric: She has a normal mood and affect. Her behavior is normal.  Nursing note and vitals reviewed.   [facility] (PRIMARY) x-ray report read by Dr. Brigitte Pulse: Right knee - Mild tricompartmental arthritis bilaterally with an isolated small bony os in right popliteal area, but  suspect benign and chronic. Small amount of soft tissue edema and joint effusion.  Dg Knee 1-2 Views Right  01/05/2015   CLINICAL DATA:  Acute right knee pain.  Initial encounter.  EXAM: RIGHT KNEE - 1-2 VIEW  COMPARISON:  None.  FINDINGS: There is no evidence of fracture, dislocation, or joint effusion. Joint spaces are intact. Minimal spurring of tibial spine is noted. Possible loose body is seen posteriorly in the joint. Soft tissues are unremarkable.  IMPRESSION: Minimal degenerative changes are noted. Possible loose body seen posteriorly in the joint. No fracture or dislocation is noted.   Electronically Signed   By: Marijo Conception, M.D.   On: 01/05/2015 12:43       Assessment & Plan:   Knee pain, acute, right - Plan: DG Knee 1-2 Views Right  Knee sprain and strain, right, initial encounter - RICE, try brace, if sxs cont would rec trial of intra-articular cortisone and/or referral to ortho since pt has poss loose bony body in the joint.   Try mobic and warned to not use any other nsaids. If pain is not controlled please call so we can call in tylenol #3 vs tramadol Meds ordered this encounter  Medications  . meloxicam (MOBIC) 15 MG tablet    Sig: Take 1 tablet (15 mg total) by mouth daily.    Dispense:  30 tablet    Refill:  1    I personally performed the services described in this documentation, which was scribed in my presence. The recorded information has been reviewed and considered, and addended by me as needed.  Delman Cheadle, MD MPH

## 2015-01-05 NOTE — Patient Instructions (Signed)
Do not use with any other otc pain medication other than tylenol/acetaminophen - so no aleve, ibuprofen, motrin, advil, etc.  If you need something additional for pain, please call and we can call you in some Tramadol or tylenol #3. Ok to continue with the topical creams,  Wear the knee brace while working or active - take it off when at home and relaxing. Ice the knee for 20 minutes three times a day for the next 3 days then ice every evening. Continue taking the meloxicam every morning until knee pain has resolved. If still having pain in 1 mo - come back to clinic so we can take the next steps but come sooner if any of your pain gets worse.  Knee Bracing Knee braces are supports to help stabilize and protect an injured or painful knee. They come in many different styles. They should support and protect the knee without increasing the chance of other injuries to yourself or others. It is important not to have a false sense of security when using a brace. Knee braces that help you to keep using your knee:  Do not restore normal knee stability under high stress forces.  May decrease some aspects of athletic performance. Some of the different types of knee braces are:  Prophylactic knee braces are designed to prevent or reduce the severity of knee injuries during sports that make injury to the knee more likely.  Rehabilitative knee braces are designed to allow protected motion of:  Injured knees.  Knees that have been treated with or without surgery. There is no evidence that the use of a supportive knee brace protects the graft following a successful anterior cruciate ligament (ACL) reconstruction. However, braces are sometimes used to:   Protect injured ligaments.  Control knee movement during the initial healing period. They may be used as part of the treatment program for the various injured ligaments or cartilage of the knee including the:  Anterior cruciate ligament.  Medial  collateral ligament.  Medial or lateral cartilage (meniscus).  Posterior cruciate ligament.  Lateral collateral ligament. Rehabilitative knee braces are most commonly used:  During crutch-assisted walking right after injury.  During crutch-assisted walking right after surgery to repair the cartilage and/or cruciate ligament injury.  For a short period of time, 2-8 weeks, after the injury or surgery. The value of a rehabilitative brace as opposed to a cast or splint includes the:  Ability to adjust the brace for swelling.  Ability to remove the brace for examinations, icing, or showering.  Ability to allow for movement in a controlled range of motion. Functional knee braces give support to knees that have already been injured. They are designed to provide stability for the injured knee and provide protection after repair. Functional knee braces may not affect performance much. Lower extremity muscle strengthening, flexibility, and improvement in technique are more important than bracing in treating ligamentous knee injuries. Functional braces are not a substitute for rehabilitation or surgical procedures. Unloader/off-loader braces are designed to provide pain relief in arthritic knees. Patients with wear and tear arthritis from growing old or from an old cartilage injury (osteoarthritis) of the knee, and bowlegged (varus) or knock-knee (valgus) deformities, often develop increased pain in the arthritic side due to increased loading. Unloader/off-loader braces are made to reduce uneven loading in such knees. There is reduction in bowing out movement in bowlegged knees when the correct unloader brace is used. Patients with advanced osteoarthritis or severe varus or valgus alignment problems would not  likely benefit from bracing. Patellofemoral braces help the kneecap to move smoothly and well centered over the end of the femur in the knee.  Most people who wear knee braces feel that they help.  However, there is a lack of scientific evidence that knee braces are helpful at the level needed for athletic participation to prevent injury. In spite of this, athletes report an increase in knee stability, pain relief, performance improvement, and confidence during athletics when using a brace.  Different knee problems require different knee braces:  Your caregiver may suggest one kind of knee brace after knee surgery.  A caregiver may choose another kind of knee brace for support instead of surgery for some types of torn ligaments.  You may also need one for pain in the front of your knee that is not getting better with strengthening and flexibility exercises. Get your caregiver's advice if you want to try a knee brace. The caregiver will advise you on where to get them and provide a prescription when it is needed to fashion and/or fit the brace. Knee braces are the least important part of preventing knee injuries or getting better following injury. Stretching, strengthening and technique improvement are far more important in caring for and preventing knee injuries. When strengthening your knee, increase your activities a little at a time so as not to develop injuries from overuse. Work out an exercise plan with your caregiver and/or physical therapist to get the best program for you. Do not let a knee brace become a crutch. Always remember, there are no braces which support the knee as well as your original ligaments and cartilage you were born with. Conditioning, proper warm-up, and stretching remain the most important parts of keeping your knees healthy. HOW TO USE A KNEE BRACE  During sports, knee braces should be used as directed by your caregiver.  Make sure that the hinges are where the knee bends.  Straps, tapes, or hook-and-loop tapes should be fastened around your leg as instructed.  You should check the placement of the brace during activities to make sure that it has not moved.  Poorly positioned braces can hurt rather than help you.  To work well, a knee brace should be worn during all activities that put you at risk of knee injury.  Warm up properly before beginning athletic activities. HOME CARE INSTRUCTIONS  Knee braces often get damaged during normal use. Replace worn-out braces for maximum benefit.  Clean regularly with soap and water.  Inspect your brace often for wear and tear.  Cover exposed metal to protect others from injury.  Durable materials may cost more, but last longer. SEEK IMMEDIATE MEDICAL CARE IF:   Your knee seems to be getting worse rather than better.  You have increasing pain or swelling in the knee.  You have problems caused by the knee brace.  You have increased swelling or inflammation (redness or soreness) in your knee.  Your knee becomes warm and more painful and you develop an unexplained temperature over 101F (38.3C). MAKE SURE YOU:   Understand these instructions.  Will watch your condition.  Will get help right away if you are not doing well or get worse. See your caregiver, physical therapist, or orthopedic surgeon for additional information. Document Released: 12/30/2003 Document Revised: 02/23/2014 Document Reviewed: 04/07/2009 [facility] Patient Information 2015 [place], [place]. This information is not intended to replace advice given to you by your health care provider. Make sure you discuss any questions you have with your  health care provider. Knee Sprain A knee sprain is a tear in one of the strong, fibrous tissues that connect the bones (ligaments) in your knee. The severity of the sprain depends on how much of the ligament is torn. The tear can be either partial or complete. CAUSES  Often, sprains are a result of a fall or injury. The force of the impact causes the fibers of your ligament to stretch too much. This excess tension causes the fibers of your ligament to tear. SIGNS AND SYMPTOMS  You may have  some loss of motion in your knee. Other symptoms include:  Bruising.  Pain in the knee area.  Tenderness of the knee to the touch.  Swelling. DIAGNOSIS  To diagnose a knee sprain, your health care provider will physically examine your knee. Your health care provider may also suggest an X-ray exam of your knee to make sure no bones are broken. TREATMENT  If your ligament is only partially torn, treatment usually involves keeping the knee in a fixed position (immobilization) or bracing your knee for activities that require movement for several weeks. To do this, your health care provider will apply a bandage, cast, or splint to keep your knee from moving and to support your knee during movement until it heals. For a partially torn ligament, the healing process usually takes 4-6 weeks. If your ligament is completely torn, depending on which ligament it is, you may need surgery to reconnect the ligament to the bone or reconstruct it. After surgery, a cast or splint may be applied and will need to stay on your knee for 4-6 weeks while your ligament heals. HOME CARE INSTRUCTIONS  Keep your injured knee elevated to decrease swelling.  To ease pain and swelling, apply ice to the injured area:  Put ice in a plastic bag.  Place a towel between your skin and the bag.  Leave the ice on for 20 minutes, 2-3 times a day.  Only take medicine for pain as directed by your health care provider.  Do not leave your knee unprotected until pain and stiffness go away (usually 4-6 weeks).  If you have a cast or splint, do not allow it to get wet. If you have been instructed not to remove it, cover it with a plastic bag when you shower or bathe. Do not swim.  Your health care provider may suggest exercises for you to do during your recovery to prevent or limit permanent weakness and stiffness. SEEK IMMEDIATE MEDICAL CARE IF:  Your cast or splint becomes damaged.  Your pain becomes worse.  You have  significant pain, swelling, or numbness below the cast or splint. MAKE SURE YOU:  Understand these instructions.  Will watch your condition.  Will get help right away if you are not doing well or get worse. Document Released: 10/09/2005 Document Revised: 07/30/2013 Document Reviewed: 05/21/2013 [facility] Patient Information 2015 [place], [place]. This information is not intended to replace advice given to you by your health care provider. Make sure you discuss any questions you have with your health care provider.

## 2015-01-11 ENCOUNTER — Other Ambulatory Visit: Payer: Self-pay

## 2015-01-11 ENCOUNTER — Other Ambulatory Visit: Payer: Commercial Managed Care - HMO

## 2015-01-12 LAB — CREATININE, URINE, 24 HOUR
Creatinine, 24H Ur: 1415 mg/d (ref 700–1800)
Creatinine, 24H Ur: 1412 mg/d (ref 700–1800)
Creatinine, Urine: 50.5 mg/dL
Creatinine, Urine: 50.4 mg/dL

## 2015-01-12 LAB — CALCIUM, URINE, 24 HOUR
Calcium, 24 hour urine: 112 mg/d (ref 100–250)
Calcium, Ur: 4 mg/dL

## 2015-01-25 ENCOUNTER — Encounter: Payer: Self-pay | Admitting: *Deleted

## 2015-02-01 ENCOUNTER — Telehealth: Payer: Self-pay | Admitting: Internal Medicine

## 2015-02-01 NOTE — Telephone Encounter (Signed)
Patient called stating she received a letter regarding her lab results. Does she need to speak with anyone pertaining to these results?   Thank you

## 2015-02-03 NOTE — Telephone Encounter (Signed)
Called pt a few times unable to reach. Will try again today.

## 2015-03-11 ENCOUNTER — Telehealth: Payer: Self-pay | Admitting: *Deleted

## 2015-03-11 NOTE — Telephone Encounter (Signed)
Called pt and left another voicemail advising her that we were calling again to answer any questions that she had concerning her lab results. Advised pt to return call so that we could answer those questions. sbc

## 2015-03-18 ENCOUNTER — Telehealth: Payer: Self-pay | Admitting: Internal Medicine

## 2015-03-18 NOTE — Telephone Encounter (Signed)
This is not an emergency >> we can also discuss at next visit.

## 2015-03-18 NOTE — Telephone Encounter (Signed)
Patient is calling about her lab results, she wants to know where she go from here.  Please advise

## 2015-03-18 NOTE — Telephone Encounter (Signed)
Returned pt's call. She received her letter. Pt said that she is feeling ok. She did not know what she should do right off hand. Would like to think about it, but would like to know if this is something that she needs to move quickly on. Please advise.

## 2015-03-18 NOTE — Telephone Encounter (Signed)
Called pt and advised her per Dr Gherghe's message below. Pt voiced understanding.  

## 2015-05-17 ENCOUNTER — Ambulatory Visit (INDEPENDENT_AMBULATORY_CARE_PROVIDER_SITE_OTHER): Payer: Commercial Managed Care - HMO | Admitting: Family Medicine

## 2015-05-17 ENCOUNTER — Encounter: Payer: Self-pay | Admitting: Family Medicine

## 2015-05-17 VITALS — BP 126/86 | HR 80 | Temp 98.4°F | Resp 16 | Ht 63.0 in | Wt 177.4 lb

## 2015-05-17 DIAGNOSIS — Z23 Encounter for immunization: Secondary | ICD-10-CM | POA: Diagnosis not present

## 2015-05-17 DIAGNOSIS — I10 Essential (primary) hypertension: Secondary | ICD-10-CM

## 2015-05-17 DIAGNOSIS — E7800 Pure hypercholesterolemia, unspecified: Secondary | ICD-10-CM

## 2015-05-17 NOTE — Patient Instructions (Signed)
Good to see you today- we gave you your first (of two) pneumonia vaccines today.   We can do your tetanus shot next time we see you if you have any sort of wound at all Please do contact Dr. Cruzita Lederer and schedule a follow-up for your high calcium level- it is important to make sure and follow-up this up! If you remember, come fasting to your next appointment and request a cholesterol panel Phone: [phone]  Please call and schedule a mammogram.  There are several options in town, one good choice is:  [facility] ?  Address: [address], [place], [place] [address]  [phone]

## 2015-05-17 NOTE — Progress Notes (Signed)
[facility] [address] [place] [address] [phone]  Date:  05/17/2015   Name:  Erica Navarro   DOB:  11-13-1950   MRN:  [id number]  PCP:  Verl Blalock, MD    Chief Complaint: Follow-up; Hypercalcemia; Hypertension; Hyperlipidemia; and right knee is "better"   History of Present Illness:  Erica Navarro is a 64 y.o. very pleasant female patient who presents with the following:  She did see endocrinology for her hyperparathyroidism - Dr. Cruzita Lederer.  She will follow-up and they are deciding about if she will have surgery to remove her parathyroids.  Follow-up appt next month with her She is not fasting today Notes that her knee is doing better- she used some meloxicam No recent mammogram- but she does plan to get one soon She does not recall a tetanus shot and has not yet had a pneumonia vaccine.    Patient Active Problem List   Diagnosis Date Noted  . Hypercalcemia 12/04/2014  . HTN (hypertension) 11/11/2012  . High cholesterol 11/11/2012    Past Medical History  Diagnosis Date  . Hyperlipidemia   . Hypertension     No past surgical history on file.  History  Substance Use Topics  . Smoking status: Never Smoker   . Smokeless tobacco: Not on file  . Alcohol Use: No    No family history on file.  Allergies  Allergen Reactions  . Lisinopril     REACTION: hands and face swell    Medication list has been reviewed and updated.  Current Outpatient Prescriptions on File Prior to Visit  Medication Sig Dispense Refill  . amLODipine (NORVASC) 10 MG tablet Take 1 tablet (10 mg total) by mouth daily. 90 tablet 3  . aspirin 81 MG tablet Take 81 mg by mouth daily.    . Cholecalciferol 1000 UNITS capsule Take 1,000 Units by mouth daily.    [name] (GLUCOSAMINE MSM COMPLEX) TABS Take 1 tablet by mouth 2 (two) times daily.    . meloxicam (MOBIC) 15 MG tablet Take 1 tablet (15 mg total) by mouth daily. 30 tablet 1  . Omega-3  Fatty Acids (FISH OIL) 1000 MG CAPS Take 1,000 mg by mouth 2 (two) times daily.    . pravastatin (PRAVACHOL) 40 MG tablet Take 1 tablet (40 mg total) by mouth daily. 90 tablet 3   No current facility-administered medications on file prior to visit.    Review of Systems:  As per HPI- otherwise negative.   Physical Examination: Filed Vitals:   05/17/15 0831  BP: 126/86  Pulse: 80  Temp: 98.4 F (36.9 C)  Resp: 16   Filed Vitals:   05/17/15 0831  Height: 5\' 3"  (1.6 m)  Weight: 177 lb 6.4 oz (80.468 kg)   Body mass index is 31.43 kg/(m^2). Ideal Body Weight: Weight in (lb) to have BMI = 25: 140.8  GEN: WDWN, NAD, Non-toxic, A & O x 3,overweight, looks well HEENT: Atraumatic, Normocephalic. Neck supple. No masses, No LAD.  Bilateral TM wnl, oropharynx normal.  PEERL,EOMI.   Ears and Nose: No external deformity. CV: RRR, No M/G/R. No JVD. No thrill. No extra heart sounds. PULM: CTA B, no wheezes, crackles, rhonchi. No retractions. No resp. distress. No accessory muscle use. EXTR: No c/c/e NEURO Normal gait.  PSYCH: Normally interactive. Conversant. Not depressed or anxious appearing.  Calm demeanor.    Assessment and Plan: Essential hypertension  Hypercalcemia  High cholesterol  Immunization due - Plan: Pneumococcal conjugate vaccine  13-valent IM  BP is controlled with current medication Not yet time to recheck labs, esp as she is not fasting Medicare will not cover tetanus unless there is some wound See Dr. Cruzita Lederer as planned next month Encouraged mammogram Follow-up 6 months   Signed Lamar Blinks, MD

## 2015-06-04 ENCOUNTER — Other Ambulatory Visit: Payer: Self-pay | Admitting: *Deleted

## 2015-06-04 ENCOUNTER — Encounter: Payer: Self-pay | Admitting: Internal Medicine

## 2015-06-04 ENCOUNTER — Ambulatory Visit (INDEPENDENT_AMBULATORY_CARE_PROVIDER_SITE_OTHER): Payer: Commercial Managed Care - HMO | Admitting: Internal Medicine

## 2015-06-04 ENCOUNTER — Other Ambulatory Visit (INDEPENDENT_AMBULATORY_CARE_PROVIDER_SITE_OTHER): Payer: Commercial Managed Care - HMO

## 2015-06-04 VITALS — BP 122/70 | HR 86 | Temp 98.3°F | Resp 12 | Wt 177.6 lb

## 2015-06-04 DIAGNOSIS — E210 Primary hyperparathyroidism: Secondary | ICD-10-CM | POA: Diagnosis not present

## 2015-06-04 LAB — VITAMIN D 25 HYDROXY (VIT D DEFICIENCY, FRACTURES): VITD: 23.45 ng/mL — ABNORMAL LOW (ref 30.00–100.00)

## 2015-06-04 NOTE — Progress Notes (Signed)
Patient ID: Erica Navarro, female   DOB: September 22, 1951, 64 y.o.   MRN: [id number]   HPI  Erica Navarro is a 64 y.o.-year-old female, returning for f/u for primary hyperparathyroidism. Last visit 6 mo ago. She is here with her daughter who offers part of the hx.  Reviewed and addended hx: Pt was dx with hypercalcemia in 10/2014. I reviewed pt's pertinent labs: Lab Results  Component Value Date   PTH 82* 12/04/2014   PTH 104* 11/24/2014   CALCIUM 10.9* 12/04/2014   CALCIUM 10.7* 11/24/2014   CALCIUM 10.8* 11/10/2014   CALCIUM 10.5 11/10/2013   CALCIUM 10.4 11/11/2012   No DEXA scans recently - none available for review. She had one long time ago...  No fractures or falls.   No h/o kidney stones.  No h/o CKD. Last BUN/Cr: Lab Results  Component Value Date   BUN 16 12/04/2014   CREATININE 0.63 12/04/2014   Pt is not on HCTZ.  No h/o vitamin D deficiency.  Pt is not on calcium, takes vitamin D 1000 IU daily - started >5 years ago; she  does eat dairy and green, leafy, vegetables.  Pt does not have a FH of hypercalcemia, pituitary tumors, thyroid cancer, or osteoporosis.   At last visit we performed further investigation for her hypercalcemia and the results were c/w primary hyperparathyroidism. The 24 h urine calcium was in the normal range, however, the rest of the labs were c/w the above dx:  Component     Latest Ref Rng 12/04/2014  Sodium     135 - 145 mEq/L 139  Potassium     3.5 - 5.1 mEq/L 4.2  Chloride     96 - 112 mEq/L 105  CO2     19 - 32 mEq/L 31  Glucose     70 - 99 mg/dL 98  BUN     6 - 23 mg/dL 16  Creatinine     0.40 - 1.20 mg/dL 0.63  Calcium     8.4 - 10.5 mg/dL 10.9 (H)  GFR     >60.00 mL/min 121.86  Vitamin D 1, 25 (OH) Total     18 - 72 pg/mL 96 (H)  Vitamin D3 1, 25 (OH)      96  Vitamin D2 1, 25 (OH)      <8  PTH     15 - 65 pg/mL 82 (H)  Calcium Ionized     1.12 - 1.32 mmol/L 1.50 (H)  VITD     30.00 - 100.00 ng/mL 37.83  Phosphorus     2.3 - 4.6 mg/dL 2.7  Magnesium     1.5 - 2.5 mg/dL 1.9   Component     Latest Ref Rng 01/11/2015 01/11/2015         3:03 PM  3:07 PM  Calcium, Ur      4   Calcium, 24 hour urine     100 - 250 mg/day 112   Creatinine, Urine      50.4 50.5  Creatinine, 24H Ur     700 - 1800 mg/day 1412 1415   24h calcium not elevated. FeCa= 0.0045 (0.45%). This is lower than expected with primary hyperparathyroidism, however, there is low suspicion for familial hypocalciuric hypercalcemia due to previously normal calcium levels and also normal magnesium levels.   I suggested a referral to surgery and maybe localization studies, but she did not make up her mind to proceed with this.  I reviewed her chart and she  also has a history of HTN, HL.  ROS: Constitutional: no weight gain/loss, no fatigue, no subjective hyperthermia/hypothermia Eyes: no blurry vision, no xerophthalmia ENT: no sore throat, no nodules palpated in throat, no dysphagia/odynophagia, no hoarseness Cardiovascular: no CP/SOB/palpitations/leg swelling Respiratory: no cough/SOB Gastrointestinal: no N/V/D/C Musculoskeletal: no muscle/joint aches Skin: no rashes Neurological: no tremors/numbness/tingling/dizziness  I reviewed pt's medications, allergies, PMH, social hx, family hx, and changes were documented in the history of present illness. Otherwise, unchanged from my initial visit note:  Past Medical History  Diagnosis Date  . Hyperlipidemia   . Hypertension    No past surgical history on file. History   Social History  . Marital Status: Single    Spouse Name: N/A  . Number of Children: 4   Occupational History  . janitor   Social History Main Topics  . Smoking status: Never Smoker   . Smokeless tobacco: Not on file  . Alcohol Use: No  . Drug Use: No   Current Outpatient Prescriptions on File Prior to Visit  Medication Sig Dispense Refill  . amLODipine (NORVASC) 10 MG tablet Take 1 tablet (10 mg total) by  mouth daily. 90 tablet 3  . aspirin 81 MG tablet Take 81 mg by mouth daily.    . Cholecalciferol 1000 UNITS capsule Take 1,000 Units by mouth daily.    [name] (GLUCOSAMINE MSM COMPLEX) TABS Take 1 tablet by mouth 2 (two) times daily.    . meloxicam (MOBIC) 15 MG tablet Take 1 tablet (15 mg total) by mouth daily. 30 tablet 1  . Omega-3 Fatty Acids (FISH OIL) 1000 MG CAPS Take 1,000 mg by mouth 2 (two) times daily.    . pravastatin (PRAVACHOL) 40 MG tablet Take 1 tablet (40 mg total) by mouth daily. 90 tablet 3   No current facility-administered medications on file prior to visit.   Allergies  Allergen Reactions  . Lisinopril     REACTION: hands and face swell   FH: - see HPI + - HTN and heart ds in mother - HL in sister  PE: BP 122/70 mmHg  Pulse 86  Temp(Src) 98.3 F (36.8 C) (Oral)  Resp 12  Wt 177 lb 9.6 oz (80.559 kg)  SpO2 96% Body mass index is 31.47 kg/(m^2).  Wt Readings from Last 3 Encounters:  06/04/15 177 lb 9.6 oz (80.559 kg)  05/17/15 177 lb 6.4 oz (80.468 kg)  01/05/15 177 lb (80.287 kg)   Constitutional: overweight, in NAD. No kyphosis. Eyes: PERRLA, EOMI, no exophthalmos ENT: moist mucous membranes, no thyromegaly, no cervical lymphadenopathy Cardiovascular: RRR, No MRG Respiratory: CTA B Gastrointestinal: abdomen soft, NT, ND, BS+ Musculoskeletal: no deformities, strength intact in all 4 Skin: moist, warm, no rashes Neurological: no tremor with outstretched hands, DTR normal in all 4  Assessment: 1. Primary hyperparathyroidism  Plan: Patient has had several instances of elevated calcium, with the highest level being at 10.8. An intact PTH level was also high, at 104 for a Ca of 10.7. No vitamin D deficiency. No apparent complications from hypercalcemia: no h/o nephrolithiasis, no osteoporosis, no fractures. No abdominal pain, depression, bone pain. She has joint pain, chronic. At last visit, we checked more labs and this pointed  towards a dx of Primary HPTH >> I suggested a referral to Sx but she did not decide yet. We reviewed the labs at this visit and I explained what the dx means. - I again discussed with the patient about the physiology of calcium and parathyroid hormone, and  possible side effects from increased PTH, including kidney stones, osteoporosis, abdominal pain, etc.  - we discussed about criteria for parathyroid surgery - she does not quite meet the main ones:  Increased calcium by more than 1 mg/dL above the upper limit of normal  Kidney ds.  Osteoporosis (or Vb fx) Age <22 years old New (2013): High UCa >400 mg/d and increased stone risk by biochemical stone risk analysis Presence of nephrolithiasis or nephrocalcinosis Pt's preference!  - we will need to check a DEXA scan to see if she has osteoporosis. I will add a 33% distal radius for evaluation of cortical bone >> ordered - today I will check: calcium level intact PTH ([facility]) vitamin D- 25 HO  - We discussed possible consequences of hyperparathyroidism: ~1/3 pts will develop complications over 15 years (OP, nephrolithiasis).   - after detailed discussion with pt and daughter, she would like to not have the surgery now, but only do it in the future if absolutely needed.  - I will see the patient back in 1 year but she will have calcium level checked in 10/2015 by PCP  Orders Placed This Encounter  Procedures  . DG Bone Density    Standing Status: Future     Number of Occurrences:      Standing Expiration Date: 08/03/2016    Order Specific Question:  Reason for Exam (SYMPTOM  OR DIAGNOSIS REQUIRED)    Answer:  hyperparathyroidism - please add 33% distal radius BMD    Order Specific Question:  Preferred imaging location?    Answer:  [facility]  . PTH, Intact and Calcium  . Vitamin D (25 hydroxy)   Component     Latest Ref Rng 06/04/2015 06/04/2015         8:47 AM  8:47 AM  Calcium     8.7 - 10.3 mg/dL  10.9 (H)  PTH     15 - 65  pg/mL  73 (H)  VITD     30.00 - 100.00 ng/mL 23.45 (L)    Vitamin D still low. Calcium and PTH not tremendously high, so we can continue to follow this expectantly, as desired by the patient. Vitamin D is low, I will advise her to increase the dose to 3000 units daily. DEXA scan pending.

## 2015-06-04 NOTE — Patient Instructions (Signed)
Please stop at the lab.  We will call you with the schedule for the bone density scan.  Please return in 1 year.

## 2015-06-05 LAB — PTH, INTACT AND CALCIUM
Calcium: 10.9 mg/dL — ABNORMAL HIGH (ref 8.7–10.3)
PTH: 73 pg/mL — ABNORMAL HIGH (ref 15–65)

## 2015-11-05 ENCOUNTER — Other Ambulatory Visit: Payer: Self-pay | Admitting: Internal Medicine

## 2015-11-15 ENCOUNTER — Ambulatory Visit: Payer: Commercial Managed Care - HMO | Admitting: Family Medicine

## 2015-11-17 ENCOUNTER — Encounter: Payer: Self-pay | Admitting: Family Medicine

## 2015-11-17 ENCOUNTER — Other Ambulatory Visit: Payer: Self-pay

## 2015-11-17 ENCOUNTER — Ambulatory Visit (INDEPENDENT_AMBULATORY_CARE_PROVIDER_SITE_OTHER): Payer: Commercial Managed Care - HMO | Admitting: Family Medicine

## 2015-11-17 VITALS — BP 132/82 | HR 75 | Temp 98.4°F | Resp 16 | Ht 63.0 in | Wt 179.2 lb

## 2015-11-17 DIAGNOSIS — Z1231 Encounter for screening mammogram for malignant neoplasm of breast: Secondary | ICD-10-CM

## 2015-11-17 DIAGNOSIS — N951 Menopausal and female climacteric states: Secondary | ICD-10-CM | POA: Diagnosis not present

## 2015-11-17 DIAGNOSIS — Z119 Encounter for screening for infectious and parasitic diseases, unspecified: Secondary | ICD-10-CM | POA: Diagnosis not present

## 2015-11-17 DIAGNOSIS — E210 Primary hyperparathyroidism: Secondary | ICD-10-CM

## 2015-11-17 DIAGNOSIS — E663 Overweight: Secondary | ICD-10-CM | POA: Diagnosis not present

## 2015-11-17 DIAGNOSIS — I10 Essential (primary) hypertension: Secondary | ICD-10-CM | POA: Diagnosis not present

## 2015-11-17 DIAGNOSIS — E785 Hyperlipidemia, unspecified: Secondary | ICD-10-CM

## 2015-11-17 DIAGNOSIS — Z1239 Encounter for other screening for malignant neoplasm of breast: Secondary | ICD-10-CM | POA: Diagnosis not present

## 2015-11-17 DIAGNOSIS — E2839 Other primary ovarian failure: Secondary | ICD-10-CM

## 2015-11-17 LAB — COMPREHENSIVE METABOLIC PANEL
ALT: 11 U/L (ref 6–29)
AST: 17 U/L (ref 10–35)
Albumin: 4.1 g/dL (ref 3.6–5.1)
Alkaline Phosphatase: 109 U/L (ref 33–130)
BUN: 10 mg/dL (ref 7–25)
CO2: 25 mmol/L (ref 20–31)
Calcium: 10.3 mg/dL (ref 8.6–10.4)
Chloride: 105 mmol/L (ref 98–110)
Creat: 0.55 mg/dL (ref 0.50–0.99)
Glucose, Bld: 86 mg/dL (ref 65–99)
Potassium: 4.1 mmol/L (ref 3.5–5.3)
Sodium: 138 mmol/L (ref 135–146)
Total Bilirubin: 0.6 mg/dL (ref 0.2–1.2)
Total Protein: 7.7 g/dL (ref 6.1–8.1)

## 2015-11-17 LAB — LIPID PANEL
Cholesterol: 234 mg/dL — ABNORMAL HIGH (ref 125–200)
HDL: 72 mg/dL (ref >=46)
LDL Cholesterol: 146 mg/dL — ABNORMAL HIGH (ref <130)
Total CHOL/HDL Ratio: 3.3 Ratio (ref <=5.0)
Triglycerides: 82 mg/dL (ref <150)
VLDL: 16 mg/dL (ref <30)

## 2015-11-17 LAB — HEPATITIS C ANTIBODY: HCV Ab: NEGATIVE

## 2015-11-17 LAB — CBC
HCT: 39.2 % (ref 36.0–46.0)
Hemoglobin: 13.1 g/dL (ref 12.0–15.0)
MCH: 28.3 pg (ref 26.0–34.0)
MCHC: 33.4 g/dL (ref 30.0–36.0)
MCV: 84.7 fL (ref 78.0–100.0)
MPV: 11.1 fL (ref 8.6–12.4)
Platelets: 309 10*3/uL (ref 150–400)
RBC: 4.63 MIL/uL (ref 3.87–5.11)
RDW: 14.6 % (ref 11.5–15.5)
WBC: 4.5 10*3/uL (ref 4.0–10.5)

## 2015-11-17 MED ORDER — AMLODIPINE BESYLATE 10 MG PO TABS: 10.0000 mg | ORAL_TABLET | Freq: Every day | ORAL | Status: DC

## 2015-11-17 MED ORDER — PRAVASTATIN SODIUM 40 MG PO TABS: 40.0000 mg | ORAL_TABLET | Freq: Every day | ORAL | Status: DC

## 2015-11-17 NOTE — Patient Instructions (Signed)
It was great to see you today- I will be in touch with your labs and we will set you up for a bone density and mammogram I will send your calcium level to Dr. Cruzita Lederer when it comes in Please get a shingles vaccine at your convenience This summer it will be time for your final pneumonia booster!  Take care   It would be my pleasure to continue to see you as a patient at my new office, starting the last week of February.  If you prefer to remain at [facility] one of my partners will be happy to see you here.   [facility] at [facility] [address] [place], [place] [address] Phone: [phone]

## 2015-11-17 NOTE — Progress Notes (Signed)
[facility] [address], [place] [address] [phone]  Date:  11/17/2015   Name:  Erica Navarro   DOB:  08-Apr-1951   MRN:  [id number]  PCP:  Verl Blalock, MD    Chief Complaint: Follow-up; Hypertension; and Pure hypercholesterolemia   History of Present Illness:  Erica Navarro is a 65 y.o. very pleasant female patient who presents with the following:  History of HTN, high cholesterol, primary hyperparathyridism. She is followed by Dr. Cruzita Lederer for this.  Needs a Ca check today- she has decided to defer parathyroidectomy as she does not have sx Here today for a follow-up visit Last cholesterol panel a year ago  She is feeling well.  She had some crackers so far today- nothing else.   Her family is doing well.   prevnar last summer- not yet time for pneumovax.  UTD on her colonoscopy She needs a mammogram and a bone density- NOT on a Wednesday She declines a flu shot today Never had shingles shot as of yet.    BP Readings from Last 3 Encounters:  11/17/15 132/82  06/04/15 122/70  05/17/15 126/86   Wt Readings from Last 3 Encounters:  11/17/15 179 lb 3.2 oz (81.285 kg)  06/04/15 177 lb 9.6 oz (80.559 kg)  05/17/15 177 lb 6.4 oz (80.468 kg)   She is maintaining her weight and not gaining.  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  She has 13 grandchildren and they all live nearby  Patient Active Problem List   Diagnosis Date Noted  . Primary hyperparathyroidism ([place]) 06/04/2015  . HTN (hypertension) 11/11/2012  . High cholesterol 11/11/2012    Past Medical History  Diagnosis Date  . Hyperlipidemia   . Hypertension     No past surgical history on file.  Social History  Substance Use Topics  . Smoking status: Never Smoker   . Smokeless tobacco: None  . Alcohol Use: No    No family history on file.  Allergies  Allergen Reactions  . Lisinopril     REACTION: hands and face swell    Medication list has been reviewed and  updated.  Current Outpatient Prescriptions on File Prior to Visit  Medication Sig Dispense Refill  . amLODipine (NORVASC) 10 MG tablet TAKE 1 TABLET BY MOUTH EVERY DAY 90 tablet 0  . aspirin 81 MG tablet Take 81 mg by mouth daily.    . Cholecalciferol 1000 UNITS capsule Take 1,000 Units by mouth daily.    [name] (GLUCOSAMINE MSM COMPLEX) TABS Take 1 tablet by mouth 2 (two) times daily.    . Omega-3 Fatty Acids (FISH OIL) 1000 MG CAPS Take 1,000 mg by mouth 2 (two) times daily.    . pravastatin (PRAVACHOL) 40 MG tablet TAKE 1 TABLET BY MOUTH DAILY 90 tablet 0  . meloxicam (MOBIC) 15 MG tablet Take 1 tablet (15 mg total) by mouth daily. (Patient not taking: Reported on 11/17/2015) 30 tablet 1   No current facility-administered medications on file prior to visit.    Review of Systems:  As per HPI- otherwise negative.   Physical Examination: Filed Vitals:   11/17/15 0806 11/17/15 0808  BP: 150/90 132/82  Pulse: 75   Temp: 98.4 F (36.9 C)   Resp: 16    Filed Vitals:   11/17/15 0806  Height: 5\' 3"  (1.6 m)  Weight: 179 lb 3.2 oz (81.285 kg)   Body mass index is 31.75 kg/(m^2). Ideal Body Weight: Weight  in (lb) to have BMI = 25: 140.8  GEN: WDWN, NAD, Non-toxic, A & O x 3, overweight, looks well HEENT: Atraumatic, Normocephalic. Neck supple. No masses, No LAD.  Bilateral TM wnl, oropharynx normal.  PEERL,EOMI.   Ears and Nose: No external deformity. CV: RRR, No M/G/R. No JVD. No thrill. No extra heart sounds. PULM: CTA B, no wheezes, crackles, rhonchi. No retractions. No resp. distress. No accessory muscle use. EXTR: No c/c/e NEURO Normal gait.  PSYCH: Normally interactive. Conversant. Not depressed or anxious appearing.  Calm demeanor.    Assessment and Plan: Essential hypertension - Plan: Comprehensive metabolic panel, amLODipine (NORVASC) 10 MG tablet  Hyperlipidemia - Plan: Lipid panel, pravastatin (PRAVACHOL) 40 MG tablet  Hyperparathyroidism,  primary ([place]) - Plan: CBC, DG Bone Density  Overweight  Screening for breast cancer - Plan: MM Digital Screening  Menopausal state - Plan: DG Bone Density  Screening examination for infectious disease - Plan: Hepatitis C antibody  Labs today- she is not totally fasting, had some crackers Referral for dexa and mammo Send calcium to DR. Gherghe Asked her to follow-up in 6 months  Overweight but stable, she is active  Signed Lamar Blinks, MD

## 2015-11-22 ENCOUNTER — Telehealth: Payer: Self-pay

## 2015-11-22 NOTE — Telephone Encounter (Signed)
The patient called to request that her bone density and mmg orders are sent to [facility].  They were originally sent to [place].  These orders have been faxed over to [facility] per patient request.

## 2015-12-03 LAB — HM MAMMOGRAPHY

## 2015-12-14 ENCOUNTER — Encounter: Payer: Self-pay | Admitting: Family Medicine

## 2015-12-15 ENCOUNTER — Telehealth: Payer: Self-pay | Admitting: Family Medicine

## 2015-12-15 NOTE — Telephone Encounter (Signed)
I received her dexa which does show osteoporosis email to her endocrinologist Dr. Cruzita Lederer Dear [name],  Yes, that would be great. I would first recheck her vitamin D to make sure this is normal before starting medicine. Would likely use Reclast or Prolia. She can have this done in our office if more convenient.  Please let me know,  Erica Navarro       Previous Messages     ----- Message -----   From: Erica Mclean, MD   Sent: 12/12/2015  8:33 AM    To: Philemon Kingdom, MD   [facility] Erica Navarro- I did a dexa scan for this pt- through [facility] so you cannot see it on Epic. She does have osteoporosis. No FRAX reported because her T score is under 2.5 at L spine (-3.6)  I know that you are treating her for hyperparathyroid. Should I go ahead and put her on a bisphosphonate?  Thanks!       Called pt to discuss- we did check her vitamin D already, it is on the high side.  Will have her follow-up with endocrine for infusion treatment as we do not do this at my office. She understands and would like to go ahead.   Sent email back to [place] asking her to arrange infusion treatment

## 2016-02-07 IMAGING — CR DG KNEE 1-2V*R*
2 series · 2 of 2 positions shown · non-contrast
Comparison: None.

CLINICAL DATA: Acute right knee pain.  Initial encounter.

EXAM:
RIGHT KNEE - 1-2 VIEW

[AP]
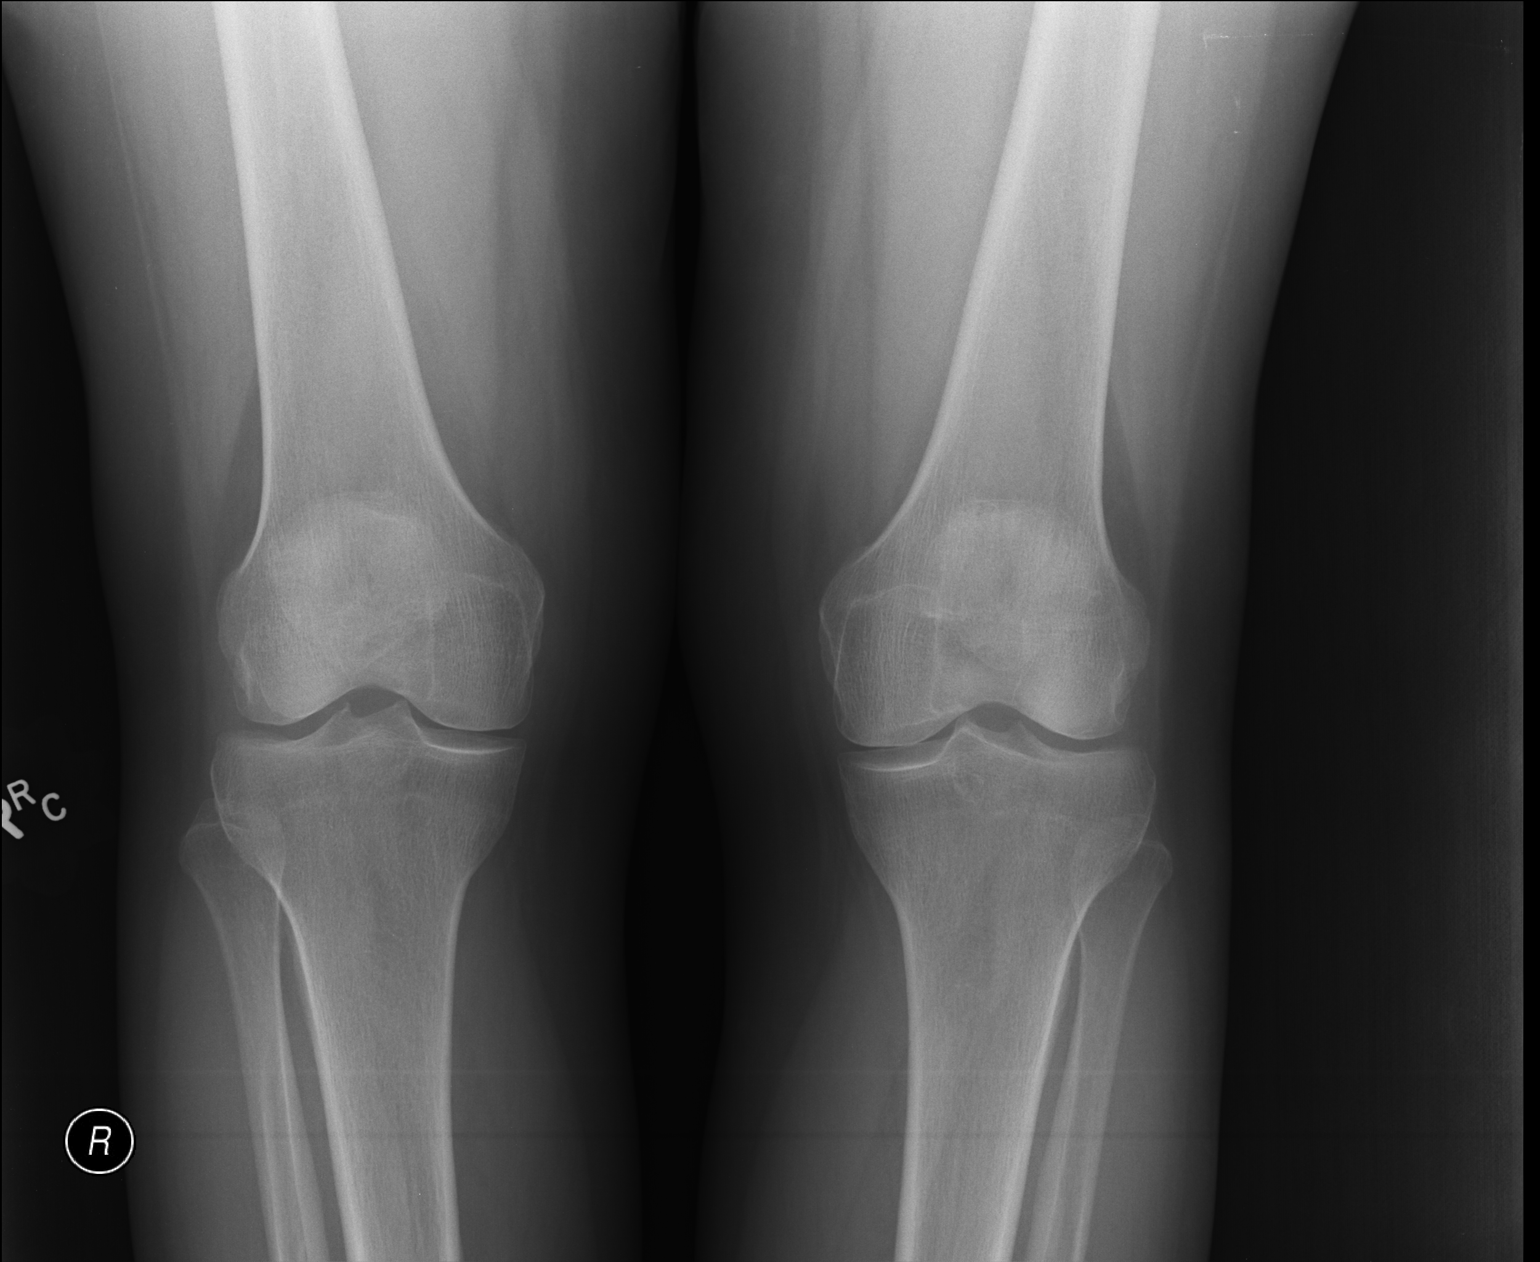

[lateral]
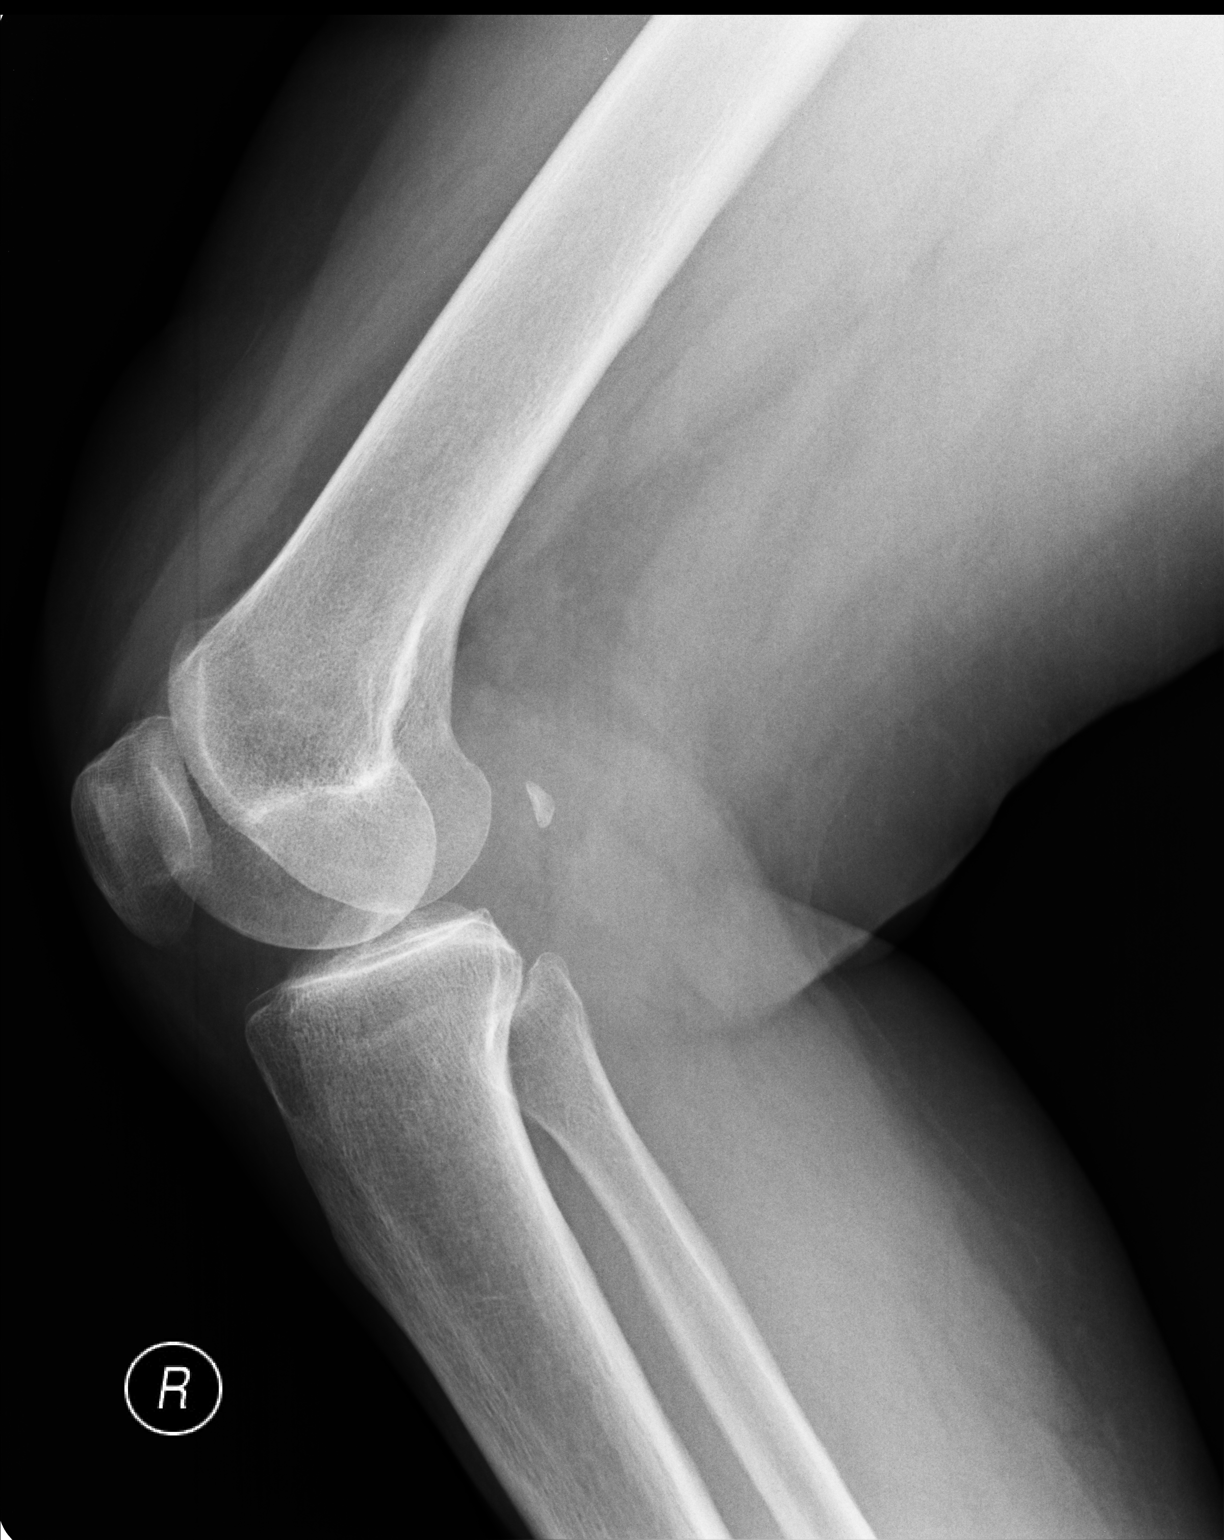

[2 of 2 positions shown; findings below may reference images not displayed]

FINDINGS: There is no evidence of fracture, dislocation, or joint effusion.
Joint spaces are intact. Minimal spurring of tibial spine is noted.
Possible loose body is seen posteriorly in the joint. Soft tissues
are unremarkable.
IMPRESSION: Minimal degenerative changes are noted. Possible loose body seen
posteriorly in the joint. No fracture or dislocation is noted.

## 2016-06-02 ENCOUNTER — Encounter: Payer: Self-pay | Admitting: Internal Medicine

## 2016-06-02 ENCOUNTER — Ambulatory Visit (INDEPENDENT_AMBULATORY_CARE_PROVIDER_SITE_OTHER): Payer: Commercial Managed Care - HMO | Admitting: Internal Medicine

## 2016-06-02 VITALS — BP 152/90 | HR 75 | Wt 179.0 lb

## 2016-06-02 DIAGNOSIS — M810 Age-related osteoporosis without current pathological fracture: Secondary | ICD-10-CM | POA: Diagnosis not present

## 2016-06-02 DIAGNOSIS — E559 Vitamin D deficiency, unspecified: Secondary | ICD-10-CM

## 2016-06-02 DIAGNOSIS — E210 Primary hyperparathyroidism: Secondary | ICD-10-CM

## 2016-06-02 LAB — VITAMIN D 25 HYDROXY (VIT D DEFICIENCY, FRACTURES): VITD: 36.55 ng/mL (ref 30.00–100.00)

## 2016-06-02 MED ORDER — ALENDRONATE SODIUM 70 MG PO TABS: 70.0000 mg | ORAL_TABLET | ORAL | 3 refills | Status: DC

## 2016-06-02 NOTE — Patient Instructions (Addendum)
Please stop at the lab.  Start Fosamax 70 mg weekly. Take it fasting, with a  Full glass of water. Do not lay down for 30 min afterwards.  Please come back for a follow-up appointment in one year.

## 2016-06-02 NOTE — Progress Notes (Signed)
Patient ID: Erica Navarro, female   DOB: 03-05-1951, 65 y.o.   MRN: [id number]   HPI  Erica Navarro is a 65 y.o.-year-old female, returning for f/u for primary hyperparathyroidism, vit D insufficiency, and OP. Last visit 1 year ago.  Reviewed and addended hx: Pt was dx with hypercalcemia in 10/2014. I reviewed pt's pertinent labs: Lab Results  Component Value Date   PTH 73 (H) 06/04/2015   PTH Comment 06/04/2015   PTH 82 (H) 12/04/2014   PTH 104 (H) 11/24/2014   CALCIUM 10.3 11/17/2015   CALCIUM 10.9 (H) 06/04/2015   CALCIUM 10.9 (H) 12/04/2014   CALCIUM 10.7 (H) 11/24/2014   CALCIUM 10.8 (H) 11/10/2014   CALCIUM 10.5 11/10/2013   CALCIUM 10.4 11/11/2012   I reviewed her most recent DEXA scan. This was consistent with osteoporosis:  12/03/2015 [facility])  Lumbar spine (L1-L4) Femoral neck (FN)  T-score - 3.6 RFN: - 2.1 LFN: - 2.4  She was on Fosamax before - cannot remember when or for how long.  No fractures or falls.   No h/o kidney stones.  No h/o CKD. Last BUN/Cr: Lab Results  Component Value Date   BUN 10 11/17/2015   CREATININE 0.55 11/17/2015   Pt is not on HCTZ.  + h/o vitamin D insufficiency: Lab Results  Component Value Date   VD25OH 23.45 (L) 06/04/2015   VD25OH 37.83 12/04/2014   Pt is not on calcium, she was taking vitamin D 1000 IU daily - We increased this to 3000 units daily at last visit   Pt does not have a FH of hypercalcemia, pituitary tumors, thyroid cancer, or osteoporosis.   At last visits we performed further investigation for her hypercalcemia and the results were c/w primary hyperparathyroidism. The 24 h urine calcium was in the normal range, however, the rest of the labs were c/w the above dx. I suggested a referral to surgery for possible parathyroidectomy, but she refused, preferring only follow-up for now.  Component     Latest Ref Rng 12/04/2014  Sodium     135 - 145 mEq/L 139  Potassium     3.5 - 5.1 mEq/L 4.2  Chloride     96 - 112  mEq/L 105  CO2     19 - 32 mEq/L 31  Glucose     70 - 99 mg/dL 98  BUN     6 - 23 mg/dL 16  Creatinine     0.40 - 1.20 mg/dL 0.63  Calcium     8.4 - 10.5 mg/dL 10.9 (H)  GFR     >60.00 mL/min 121.86  Vitamin D 1, 25 (OH) Total     18 - 72 pg/mL 96 (H)  Vitamin D3 1, 25 (OH)      96  Vitamin D2 1, 25 (OH)      <8  PTH     15 - 65 pg/mL 82 (H)  Calcium Ionized     1.12 - 1.32 mmol/L 1.50 (H)  VITD     30.00 - 100.00 ng/mL 37.83  Phosphorus     2.3 - 4.6 mg/dL 2.7  Magnesium     1.5 - 2.5 mg/dL 1.9   Component     Latest Ref Rng 01/11/2015 01/11/2015         3:03 PM  3:07 PM  Calcium, Ur      4   Calcium, 24 hour urine     100 - 250 mg/day 112   Creatinine, Urine  50.4 50.5  Creatinine, 24H Ur     700 - 1800 mg/day 1412 1415   24h calcium not elevated. FeCa= 0.0045 (0.45%). This is lower than expected with primary hyperparathyroidism, however, there is low suspicion for familial hypocalciuric hypercalcemia due to previously normal calcium levels and also normal magnesium levels.   She also has a history of HTN, HL.  ROS: Constitutional: no weight gain/loss, no fatigue, no subjective hyperthermia/hypothermia Eyes: no blurry vision, no xerophthalmia ENT: no sore throat, no nodules palpated in throat, no dysphagia/odynophagia, no hoarseness Cardiovascular: no CP/SOB/palpitations/leg swelling Respiratory: no cough/SOB Gastrointestinal: no N/V/D/C Musculoskeletal: no muscle/joint aches Skin: no rashes Neurological: no tremors/numbness/tingling/dizziness  I reviewed pt's medications, allergies, PMH, social hx, family hx, and changes were documented in the history of present illness. Otherwise, unchanged from my initial visit note:  Past Medical History:  Diagnosis Date  . Hyperlipidemia   . Hypertension    No past surgical history on file. History   Social History  . Marital Status: Single    Spouse Name: N/A  . Number of Children: 4   Occupational  History  . janitor   Social History Main Topics  . Smoking status: Never Smoker   . Smokeless tobacco: Not on file  . Alcohol Use: No  . Drug Use: No   Current Outpatient Prescriptions on File Prior to Visit  Medication Sig Dispense Refill  . amLODipine (NORVASC) 10 MG tablet Take 1 tablet (10 mg total) by mouth daily. 90 tablet 3  . aspirin 81 MG tablet Take 81 mg by mouth daily.    . Cholecalciferol 1000 UNITS capsule Take 1,000 Units by mouth daily.    [name] (GLUCOSAMINE MSM COMPLEX) TABS Take 1 tablet by mouth 2 (two) times daily.    . meloxicam (MOBIC) 15 MG tablet Take 1 tablet (15 mg total) by mouth daily. (Patient not taking: Reported on 11/17/2015) 30 tablet 1  . Omega-3 Fatty Acids (FISH OIL) 1000 MG CAPS Take 1,000 mg by mouth 2 (two) times daily.    . pravastatin (PRAVACHOL) 40 MG tablet Take 1 tablet (40 mg total) by mouth daily. 90 tablet 3   No current facility-administered medications on file prior to visit.    Allergies  Allergen Reactions  . Lisinopril     REACTION: hands and face swell   FH: - see HPI + - HTN and heart ds in mother - HL in sister  PE: BP (!) 152/90 (BP Location: Left Arm, Patient Position: Sitting)   Pulse 75   Wt 179 lb (81.2 kg)   SpO2 96%   BMI 31.71 kg/m  Body mass index is 31.71 kg/m.  Wt Readings from Last 3 Encounters:  06/02/16 179 lb (81.2 kg)  11/17/15 179 lb 3.2 oz (81.3 kg)  06/04/15 177 lb 9.6 oz (80.6 kg)   Constitutional: overweight, in NAD. No kyphosis. Eyes: PERRLA, EOMI, no exophthalmos ENT: moist mucous membranes, no thyromegaly, no cervical lymphadenopathy Cardiovascular: RRR, No MRG Respiratory: CTA B Gastrointestinal: abdomen soft, NT, ND, BS+ Musculoskeletal: no deformities, strength intact in all 4 Skin: moist, warm, no rashes Neurological: no tremor with outstretched hands, DTR normal in all 4  Assessment: 1. Primary hyperparathyroidism  2. Vitamin D insufficiency  3.  OP  Plan: Patient has had several instances of elevated calcium, with the highest level being at 10.9. An intact PTH level was also high, at 104 for a Ca of 10.7. She also has a history of vitamin D insufficiency.  -  No h/o nephrolithiasis, no fractures, but she has osteoporosis. No abdominal pain, depression, bone pain.  - At last visits, we checked more labs and this pointed towards a dx of Primary HPTH >> I suggested a referral to Sx but she refused, preferring only to continue monitoring her calcium and PTH for now.  - I again discussed with the patient about the physiology of calcium and parathyroid hormone, and possible side effects from increased PTH, including kidney stones, osteoporosis, abdominal pain, etc. We also discussed possible consequences of hyperparathyroidism: ~1/3 pts will develop complications over 15 years (OP, nephrolithiasis).   - we reviewed her most recent DEXA scan and I explained that has osteoporosis, which may get worse if her hyperparathyroid state persists. We also discussed about the possibility of adding bisphosphonates once a week. She agrees. - today I will check: calcium level intact PTH ([facility]) vitamin D- 25 HO  - I will see the patient back in 1 year   2. Vitamin D insufficiency - Patient had a slightly low vitamin D level at last visit and we increased her supplementation to 3000 units daily of over the counter vitamin D - We will check a new level today  3. OP - will start Fosamax 70 mg weekly - Discussed benefits and possible SEs: ONJ (no dental w/u planned) and atypical fractures - no GERD   Orders Placed This Encounter  Procedures  . PTH, intact and calcium    Please send to [facility], do not send to [facility]!  Marland Kitchen VITAMIN D 25 Hydroxy (Vit-D Deficiency, Fractures)   Component     Latest Ref Rng & Units 06/02/2016 06/02/2016         8:18 AM  8:18 AM  Calcium     8.7 - 10.3 mg/dL  10.6 (H)  PTH     15 - 65 pg/mL  43  VITD     30.00 - 100.00  ng/mL 36.55    Vitamin D is normal. Continue current supplementation. Her calcium is slightly high, with a normal, but unsuppressed PTH. Starting Fosamax will help improving the calcium level.  Philemon Kingdom, MD PhD [facility] Endocrinology

## 2016-06-03 LAB — PTH, INTACT AND CALCIUM
Calcium: 10.6 mg/dL — ABNORMAL HIGH (ref 8.7–10.3)
PTH: 43 pg/mL (ref 15–65)

## 2016-06-06 ENCOUNTER — Telehealth: Payer: Self-pay

## 2016-06-06 NOTE — Telephone Encounter (Signed)
Called and spoke with patient about lab results, advised to continue same dosage of vitamin D, advised patient to begin fosamax. Patient states she is starting it this Sunday.

## 2016-10-30 ENCOUNTER — Ambulatory Visit (INDEPENDENT_AMBULATORY_CARE_PROVIDER_SITE_OTHER): Payer: Medicare HMO | Admitting: Family Medicine

## 2016-10-30 VITALS — BP 126/84 | HR 68 | Temp 97.9°F | Ht 63.0 in | Wt 176.6 lb

## 2016-10-30 DIAGNOSIS — M818 Other osteoporosis without current pathological fracture: Secondary | ICD-10-CM

## 2016-10-30 DIAGNOSIS — E782 Mixed hyperlipidemia: Secondary | ICD-10-CM

## 2016-10-30 DIAGNOSIS — K439 Ventral hernia without obstruction or gangrene: Secondary | ICD-10-CM

## 2016-10-30 DIAGNOSIS — Z23 Encounter for immunization: Secondary | ICD-10-CM | POA: Diagnosis not present

## 2016-10-30 DIAGNOSIS — Z5181 Encounter for therapeutic drug level monitoring: Secondary | ICD-10-CM | POA: Diagnosis not present

## 2016-10-30 DIAGNOSIS — Z130 Encounter for screening for diseases of the blood and blood-forming organs and certain disorders involving the immune mechanism: Secondary | ICD-10-CM

## 2016-10-30 DIAGNOSIS — I10 Essential (primary) hypertension: Secondary | ICD-10-CM

## 2016-10-30 DIAGNOSIS — Z2911 Encounter for prophylactic immunotherapy for respiratory syncytial virus (RSV): Secondary | ICD-10-CM

## 2016-10-30 DIAGNOSIS — E210 Primary hyperparathyroidism: Secondary | ICD-10-CM

## 2016-10-30 DIAGNOSIS — Z9189 Other specified personal risk factors, not elsewhere classified: Secondary | ICD-10-CM

## 2016-10-30 DIAGNOSIS — Z8639 Personal history of other endocrine, nutritional and metabolic disease: Secondary | ICD-10-CM | POA: Diagnosis not present

## 2016-10-30 LAB — COMPREHENSIVE METABOLIC PANEL
ALT: 12 U/L (ref 0–35)
AST: 16 U/L (ref 0–37)
Albumin: 4.2 g/dL (ref 3.5–5.2)
Alkaline Phosphatase: 91 U/L (ref 39–117)
BUN: 14 mg/dL (ref 6–23)
CO2: 27 mEq/L (ref 19–32)
Calcium: 10.8 mg/dL — ABNORMAL HIGH (ref 8.4–10.5)
Chloride: 103 mEq/L (ref 96–112)
Creatinine, Ser: 0.56 mg/dL (ref 0.40–1.20)
GFR: 138.79 mL/min (ref >60.00)
Glucose, Bld: 85 mg/dL (ref 70–99)
Potassium: 3.9 mEq/L (ref 3.5–5.1)
Sodium: 138 mEq/L (ref 135–145)
Total Bilirubin: 0.5 mg/dL (ref 0.2–1.2)
Total Protein: 8.3 g/dL (ref 6.0–8.3)

## 2016-10-30 LAB — CBC
HCT: 39.6 % (ref 36.0–46.0)
Hemoglobin: 13.3 g/dL (ref 12.0–15.0)
MCHC: 33.5 g/dL (ref 30.0–36.0)
MCV: 85 fl (ref 78.0–100.0)
Platelets: 306 10*3/uL (ref 150.0–400.0)
RBC: 4.66 Mil/uL (ref 3.87–5.11)
RDW: 14.8 % (ref 11.5–15.5)
WBC: 6 10*3/uL (ref 4.0–10.5)

## 2016-10-30 LAB — LIPID PANEL
Cholesterol: 250 mg/dL — ABNORMAL HIGH (ref 0–200)
HDL: 66 mg/dL (ref >39.00)
LDL Cholesterol: 170 mg/dL — ABNORMAL HIGH (ref 0–99)
NonHDL: 184.38
Total CHOL/HDL Ratio: 4
Triglycerides: 71 mg/dL (ref 0.0–149.0)
VLDL: 14.2 mg/dL (ref 0.0–40.0)

## 2016-10-30 LAB — VITAMIN D 25 HYDROXY (VIT D DEFICIENCY, FRACTURES): VITD: 42.29 ng/mL (ref 30.00–100.00)

## 2016-10-30 LAB — TSH: TSH: 1.04 u[IU]/mL (ref 0.35–4.50)

## 2016-10-30 MED ORDER — PRAVASTATIN SODIUM 40 MG PO TABS: 40.0000 mg | ORAL_TABLET | Freq: Every day | ORAL | 3 refills | Status: DC

## 2016-10-30 MED ORDER — AMLODIPINE BESYLATE 10 MG PO TABS: 10.0000 mg | ORAL_TABLET | Freq: Every day | ORAL | 3 refills | Status: DC

## 2016-10-30 NOTE — Progress Notes (Signed)
[place] at [facility] [address], [place], [place] [address] [phone]  Date:  10/30/2016   Name:  Erica Navarro   DOB:  [birth year]   MRN:  [id number]  PCP:  Erica Blinks, MD    Chief Complaint: Follow-up (Pt here for 6 month follow up. Declined flu vaccine. )   History of Present Illness:  Erica Navarro is a 66 y.o. very pleasant female patient who presents with the following:  Here today for a 1 year follow-up visit for her HTN Last seen by myself about one year ago She also has history of hyperlipidemia, primary hyperparathyroidism and osteoporosis, low vitamin D She is a pt of Dr. Cruzita Navarro for endocrinology - so far she has not had a parathyroidectomy.   She has been feeling well.   She is due for a vitamin D check- she is on the OTC supplement She is taking fosamax for her osteoporosis- she has to lay it out so she won't forget to take it.    She had a few crackers so far today- will do labs for her today She is due for a TSH, vitamin D, cholesterol, CBC, CMP  She thinks her last tetanus was done at [facility]- we will call them and find out for her She has not yet had her zostvax- would like today  She did have her prenar 13 in 2016- would like to have pneumovax  She had used meloxicam for knee pain but no longer needs this    BP Readings from Last 3 Encounters:  10/30/16 126/84  06/02/16 (!) 152/90  11/17/15 132/82     Patient Active Problem List   Diagnosis Date Noted  . Osteoporosis 06/02/2016  . Vitamin D insufficiency 06/02/2016  . Primary hyperparathyroidism ([place]) 06/04/2015  . HTN (hypertension) 11/11/2012  . High cholesterol 11/11/2012    Past Medical History:  Diagnosis Date  . Hyperlipidemia   . Hypertension     No past surgical history on file.  Social History  Substance Use Topics  . Smoking status: Never Smoker  . Smokeless tobacco: Not on file  . Alcohol use No    No family history  on file.  Allergies  Allergen Reactions  . Lisinopril     REACTION: hands and face swell    Medication list has been reviewed and updated.  Current Outpatient Prescriptions on File Prior to Visit  Medication Sig Dispense Refill  . alendronate (FOSAMAX) 70 MG tablet Take 1 tablet (70 mg total) by mouth once a week. Take with a full glass of water on an empty stomach. 15 tablet 3  . amLODipine (NORVASC) 10 MG tablet Take 1 tablet (10 mg total) by mouth daily. 90 tablet 3  . aspirin 81 MG tablet Take 81 mg by mouth daily.    . Cholecalciferol 1000 UNITS capsule Take 1,000 Units by mouth daily.    Erica Navarro (GLUCOSAMINE MSM COMPLEX) TABS Take 1 tablet by mouth 2 (two) times daily.    . meloxicam (MOBIC) 15 MG tablet Take 1 tablet (15 mg total) by mouth daily. 30 tablet 1  . Omega-3 Fatty Acids (FISH OIL) 1000 MG CAPS Take 1,000 mg by mouth 2 (two) times daily.    . pravastatin (PRAVACHOL) 40 MG tablet Take 1 tablet (40 mg total) by mouth daily. 90 tablet 3   No current facility-administered medications on file prior to visit.     Review of Systems:  As per  HPI- otherwise negative.   Physical Examination: Vitals:   10/30/16 0903  BP: 126/84  Pulse: 68  Temp: 97.9 F (36.6 C)   Vitals:   10/30/16 0903  Weight: 176 lb 9.6 oz (80.1 kg)  Height: 5\' 3"  (1.6 m)   Body mass index is 31.28 kg/m. Ideal Body Weight: Weight in (lb) to have BMI = 25: 140.8  GEN: WDWN, NAD, Non-toxic, A & O x 3 HEENT: Atraumatic, Normocephalic. Neck supple. No masses, No LAD. Ears and Nose: No external deformity. CV: RRR, No M/G/R. No JVD. No thrill. No extra heart sounds. PULM: CTA B, no wheezes, crackles, rhonchi. No retractions. No resp. distress. No accessory muscle use. ABD: S, NT, ND, +BS. No rebound. No HSM.  She has a ventral hernia but this does not give her any pain  EXTR: No c/c/e NEURO Normal gait.  PSYCH: Normally interactive. Conversant. Not depressed or anxious  appearing.  Calm demeanor.    Assessment and Plan: Mixed hyperlipidemia - Plan: pravastatin (PRAVACHOL) 40 MG tablet, Lipid panel  Essential hypertension - Plan: amLODipine (NORVASC) 10 MG tablet  Immunization due - Plan: Pneumococcal polysaccharide vaccine 23-valent greater than or equal to 2yo subcutaneous/IM, Varicella-zoster vaccine subcutaneous  Hyperparathyroidism, primary (HCC)  Other osteoporosis without current pathological fracture - Plan: Comprehensive metabolic panel, TSH  Screening for deficiency anemia - Plan: CBC  Medication monitoring encounter - Plan: CBC  History of vitamin D deficiency - Plan: Vitamin D (25 hydroxy)  Ventral hernia without obstruction or gangrene  Sedentary lifestyle   Here today for a periodic recheck BP is controlled- refilled medication Lipids- check levels today, refilled med Screening for anemia as above She has a known history of vitamin D def- check level for her today Also maintained on fosamax for osteoporosis likely related to her primary hyperparathyroidism.  Check calcium level for her today. She is seeing Dr. Cruzita Navarro in about 6 months  She declines a flu shot Encouraged exercise Continue to monitor ventral hernia- would not encourage operative repair as long as asymptomatic   Signed Erica Blinks, MD

## 2016-10-30 NOTE — Patient Instructions (Signed)
It was a pleasure to see you today! You got your one time shingles vaccine and your pneumonia booster today- you are now up to date on both of these series. We will call [facility] and try to find out about your most recent tetanus vaccine Your blood pressure looks good- continue current medication I will check your cholesterol today as well as your thyroid, vitamin D, blood count

## 2016-10-30 NOTE — Progress Notes (Signed)
Pre visit review using our clinic review tool, if applicable. No additional management support is needed unless otherwise documented below in the visit note. 

## 2016-10-31 ENCOUNTER — Encounter: Payer: Self-pay | Admitting: Family Medicine

## 2016-11-26 ENCOUNTER — Emergency Department (HOSPITAL_COMMUNITY)
Admission: EM | Admit: 2016-11-26 | Discharge: 2016-11-26 | Disposition: A | Discharge status: Home / Self Care | Payer: Medicare HMO | Attending: Emergency Medicine | Admitting: Emergency Medicine

## 2016-11-26 ENCOUNTER — Encounter (HOSPITAL_COMMUNITY): Payer: Self-pay | Admitting: Emergency Medicine

## 2016-11-26 DIAGNOSIS — R55 Syncope and collapse: Secondary | ICD-10-CM | POA: Diagnosis not present

## 2016-11-26 DIAGNOSIS — R404 Transient alteration of awareness: Secondary | ICD-10-CM | POA: Diagnosis not present

## 2016-11-26 DIAGNOSIS — Z79899 Other long term (current) drug therapy: Secondary | ICD-10-CM | POA: Insufficient documentation

## 2016-11-26 DIAGNOSIS — Z7982 Long term (current) use of aspirin: Secondary | ICD-10-CM | POA: Insufficient documentation

## 2016-11-26 DIAGNOSIS — R531 Weakness: Secondary | ICD-10-CM | POA: Diagnosis not present

## 2016-11-26 DIAGNOSIS — I10 Essential (primary) hypertension: Secondary | ICD-10-CM | POA: Insufficient documentation

## 2016-11-26 LAB — URINALYSIS, ROUTINE W REFLEX MICROSCOPIC
Bacteria, UA: NONE SEEN
Bilirubin Urine: NEGATIVE
Glucose, UA: NEGATIVE mg/dL
Hgb urine dipstick: NEGATIVE
Ketones, ur: NEGATIVE mg/dL
Leukocytes, UA: NEGATIVE
Nitrite: NEGATIVE
Protein, ur: NEGATIVE mg/dL
Specific Gravity, Urine: 1.019 (ref 1.005–1.030)
pH: 6 (ref 5.0–8.0)

## 2016-11-26 LAB — CBC
HCT: 39.6 % (ref 36.0–46.0)
Hemoglobin: 13.1 g/dL (ref 12.0–15.0)
MCH: 27.9 pg (ref 26.0–34.0)
MCHC: 33.1 g/dL (ref 30.0–36.0)
MCV: 84.3 fL (ref 78.0–100.0)
Platelets: 284 10*3/uL (ref 150–400)
RBC: 4.7 MIL/uL (ref 3.87–5.11)
RDW: 14 % (ref 11.5–15.5)
WBC: 6.4 10*3/uL (ref 4.0–10.5)

## 2016-11-26 LAB — BASIC METABOLIC PANEL
Anion gap: 7 (ref 5–15)
BUN: 16 mg/dL (ref 6–20)
CO2: 26 mmol/L (ref 22–32)
Calcium: 10.3 mg/dL (ref 8.9–10.3)
Chloride: 106 mmol/L (ref 101–111)
Creatinine, Ser: 0.76 mg/dL (ref 0.44–1.00)
GFR calc Af Amer: >60 mL/min (ref >60)
GFR calc non Af Amer: >60 mL/min (ref >60)
Glucose, Bld: 155 mg/dL — ABNORMAL HIGH (ref 65–99)
Potassium: 3.3 mmol/L — ABNORMAL LOW (ref 3.5–5.1)
Sodium: 139 mmol/L (ref 135–145)

## 2016-11-26 LAB — CBG MONITORING, ED: Glucose-Capillary: 123 mg/dL — ABNORMAL HIGH (ref 65–99)

## 2016-11-26 MED ORDER — SODIUM CHLORIDE 0.9 % IV BOLUS (SEPSIS)
1000.0000 mL | Freq: Once | INTRAVENOUS | Status: AC
  Administered 2016-11-26: 1000 mL via INTRAVENOUS

## 2016-11-26 NOTE — ED Provider Notes (Signed)
[facility] DEPT Provider Note   CSN: [id number] Arrival date & time: 11/26/16  1124     History   Chief Complaint Chief Complaint  Patient presents with  . Near Syncope    HPI Ashira Barsoum is a 66 y.o. female.  HPI Pt felt well today.  She had walked out to take out the trash.   She went back into the kitchen and bent down, when she tried to stand up she felt lightheaded as if she was going to pass out.   She was not able to stand up.  She sat down in another room but her symptoms continued.  She wanted to go lie down but she felt like she might fall.  EMS was called.   Her symptoms have no resolved but the symptoms earlier lasted about 20 minutes.   Past Medical History:  Diagnosis Date  . Hyperlipidemia   . Hypertension     Patient Active Problem List   Diagnosis Date Noted  . Ventral hernia without obstruction or gangrene 10/30/2016  . Sedentary lifestyle 10/30/2016  . Osteoporosis 06/02/2016  . Vitamin D insufficiency 06/02/2016  . Primary hyperparathyroidism ([place]) 06/04/2015  . HTN (hypertension) 11/11/2012  . High cholesterol 11/11/2012    History reviewed. No pertinent surgical history.  OB History    No data available       Home Medications    Prior to Admission medications   Medication Sig Start Date End Date Taking? Authorizing Provider  alendronate (FOSAMAX) 70 MG tablet Take 1 tablet (70 mg total) by mouth once a week. Take with a full glass of water on an empty stomach. 06/02/16   Philemon Kingdom, MD  amLODipine (NORVASC) 10 MG tablet Take 1 tablet (10 mg total) by mouth daily. 10/30/16   Darreld Mclean, MD  aspirin 81 MG tablet Take 81 mg by mouth daily.    Historical Provider, MD  Cholecalciferol 1000 UNITS capsule Take 1,000 Units by mouth daily.    Historical Provider, MD  Glucos-MSM-C-Mn-Ginger-Willow (GLUCOSAMINE MSM COMPLEX) TABS Take 1 tablet by mouth 2 (two) times daily.    Historical Provider, MD  Omega-3 Fatty Acids (FISH OIL) 1000 MG  CAPS Take 1,000 mg by mouth 2 (two) times daily.    Historical Provider, MD  pravastatin (PRAVACHOL) 40 MG tablet Take 1 tablet (40 mg total) by mouth daily. 10/30/16   Darreld Mclean, MD    Family History No family history on file.  Social History Social History  Substance Use Topics  . Smoking status: Never Smoker  . Smokeless tobacco: Not on file  . Alcohol use No     Allergies   Lisinopril   Review of Systems Review of Systems  Constitutional: Positive for unexpected weight change. Negative for fever.  Respiratory: Negative for shortness of breath.   Cardiovascular: Negative for chest pain and palpitations.  Gastrointestinal: Negative for blood in stool.       No dark stools   Musculoskeletal:       She had some pain in her arm for the last couple of weeks after a shot but today it is not bothering her  Neurological: Positive for weakness. Negative for speech difficulty, numbness and headaches.     Physical Exam Updated Vital Signs BP 127/72   Pulse 76   Temp 98.1 F (36.7 C) (Oral)   Resp 18   Ht 5\' 3"  (1.6 m)   Wt 80.7 kg   SpO2 98%   BMI 31.53 kg/m  Physical Exam  Constitutional: She appears well-developed and well-nourished. No distress.  HENT:  Head: Normocephalic and atraumatic.  Right Ear: External ear normal.  Left Ear: External ear normal.  Eyes: Conjunctivae are normal. Right eye exhibits no discharge. Left eye exhibits no discharge. No scleral icterus.  Neck: Neck supple. No tracheal deviation present.  Cardiovascular: Normal rate, regular rhythm and intact distal pulses.   Pulmonary/Chest: Effort normal and breath sounds normal. No stridor. No respiratory distress. She has no wheezes. She has no rales.  Abdominal: Soft. Bowel sounds are normal. She exhibits no distension. There is no tenderness. There is no rebound and no guarding.  Musculoskeletal: She exhibits no edema or tenderness.  Neurological: She is alert. She has normal strength.  No cranial nerve deficit (no facial droop, extraocular movements intact, no slurred speech) or sensory deficit. She exhibits normal muscle tone. She displays no seizure activity. Coordination normal.  Skin: Skin is warm and dry. No rash noted.  Psychiatric: She has a normal mood and affect.  Nursing note and vitals reviewed.    ED Treatments / Results  Labs (all labs ordered are listed, but only abnormal results are displayed) Labs Reviewed  BASIC METABOLIC PANEL - Abnormal; Notable for the following:       Result Value   Potassium 3.3 (*)    Glucose, Bld 155 (*)    All other components within normal limits  URINALYSIS, ROUTINE W REFLEX MICROSCOPIC - Abnormal; Notable for the following:    Squamous Epithelial / LPF 0-5 (*)    All other components within normal limits  CBG MONITORING, ED - Abnormal; Notable for the following:    Glucose-Capillary 123 (*)    All other components within normal limits  CBC    EKG  EKG Interpretation  Date/Time:  Sunday November 26 2016 11:41:40 EST Ventricular Rate:  82 PR Interval:    QRS Duration: 90 QT Interval:  362 QTC Calculation: 423 R Axis:   76 Text Interpretation:  Sinus rhythm Right atrial enlargement No old tracing to compare Confirmed by Kailea Dannemiller  MD-J, Faiza Bansal [id number]) on 11/26/2016 12:41:23 PM      Procedures Procedures (including critical care time)  Medications Ordered in ED Medications  sodium chloride 0.9 % bolus 1,000 mL (0 mLs Intravenous Stopped 11/26/16 1411)     Initial Impression / Assessment and Plan / ED Course  I have reviewed the triage vital signs and the nursing notes.  Pertinent labs & imaging results that were available during my care of the patient were reviewed by me and considered in my medical decision making (see chart for details).   patient had a near syncopal episode at home today.  No cardiac history.  Exam is reassuring.  Labs and ed workup normal.  No recurrent episodes in the ED.  ?vasovagal  episode?  Stable for discharge.  Discussed follow up with pcp.  Warning signs and precautions discussed  Final Clinical Impressions(s) / ED Diagnoses   Final diagnoses:  Near syncope    New Prescriptions New Prescriptions   No medications on file     Dorie Rank, MD 11/26/16 1500

## 2016-11-26 NOTE — Discharge Instructions (Signed)
Follow-up with your primary care doctor later this week to be rechecked, return to the emergency room for any recurrent symptoms.

## 2016-11-26 NOTE — ED Triage Notes (Signed)
[facility] EMS patient comes from home for generalized weakness that started couple hours ago when she was baking got really weak and felt like she was going to pass out and had to sit down.  Patient hasnt had flu shot this year.  Patient had headache yestereday for little while then went away. CBG 105 vitals: 155/87, HR 94, 96% 12R

## 2016-11-29 ENCOUNTER — Ambulatory Visit (INDEPENDENT_AMBULATORY_CARE_PROVIDER_SITE_OTHER): Payer: Medicare HMO | Admitting: Family Medicine

## 2016-11-29 VITALS — BP 143/84 | HR 78 | Temp 98.5°F | Ht 63.0 in | Wt 176.2 lb

## 2016-11-29 DIAGNOSIS — Z09 Encounter for follow-up examination after completed treatment for conditions other than malignant neoplasm: Secondary | ICD-10-CM

## 2016-11-29 DIAGNOSIS — R55 Syncope and collapse: Secondary | ICD-10-CM

## 2016-11-29 DIAGNOSIS — E210 Primary hyperparathyroidism: Secondary | ICD-10-CM

## 2016-11-29 DIAGNOSIS — I10 Essential (primary) hypertension: Secondary | ICD-10-CM

## 2016-11-29 NOTE — Progress Notes (Signed)
Pre visit review using our clinic review tool, if applicable. No additional management support is needed unless otherwise documented below in the visit note. 

## 2016-11-29 NOTE — Patient Instructions (Signed)
It was very nice to see you today Please let me know if you have any further episodes like what happened last week, or if you have any other concerns at all  Take care!

## 2016-11-29 NOTE — Progress Notes (Signed)
[place] at [facility] [address], [place], [place] [address] [phone]  Date:  11/29/2016   Name:  Erica Navarro   DOB:  1951-09-02   MRN:  [id number]  PCP:  Lamar Blinks, MD    Chief Complaint: Hospitalization Follow-up (Seen in ER on 11/26/16 for near syncope. )   History of Present Illness:  Erica Navarro is a 66 y.o. very pleasant female patient who presents with the following:  Last seen here about a month ago for a periodic recheck and hyperlipidemia.  HPI from that visit-  Here today for a 1 year follow-up visit for her HTN Last seen by myself about one year ago She also has history of hyperlipidemia, primary hyperparathyroidism and osteoporosis, low vitamin D She is a pt of Dr. Cruzita Lederer for endocrinology - so far she has not had a parathyroidectomy.   She has been feeling well.   She is due for a vitamin D check- she is on the OTC supplement She is taking fosamax for her osteoporosis- she has to lay it out so she won't forget to take it.    Reviewed ED notes from 2/4- she had a pre-syncopal episode at home that sounds like a vagal episode.  Her ER eval was reassuring. Labs showed no anemia Mild hypokalemia at 3.3 (was 3.9 at our office OV) She has a history of hyperparathyroidism no operation as of yet.  Recent calcium was in normal range  EKG showed no acute findings, no ST change   She has not had any more of these episodes since she was in the ER She is now feeling "better," but not quite normal because she is worried about having another episode She has been eating normally, no diets or other changes She has noted some left ear pain- coming and going. No change in her hearing or vision No vomiting or diarrhea She has not noted any usual headache  No CP ,SOB or palpitations BP Readings from Last 3 Encounters:  11/29/16 (!) 143/84  11/26/16 123/72  10/30/16 126/84     Patient Active Problem List    Diagnosis Date Noted  . Ventral hernia without obstruction or gangrene 10/30/2016  . Sedentary lifestyle 10/30/2016  . Osteoporosis 06/02/2016  . Vitamin D insufficiency 06/02/2016  . Primary hyperparathyroidism ([place]) 06/04/2015  . HTN (hypertension) 11/11/2012  . High cholesterol 11/11/2012    Past Medical History:  Diagnosis Date  . Hyperlipidemia   . Hypertension     No past surgical history on file.  Social History  Substance Use Topics  . Smoking status: Never Smoker  . Smokeless tobacco: Not on file  . Alcohol use No    No family history on file.  Allergies  Allergen Reactions  . Lisinopril     REACTION: hands and face swell    Medication list has been reviewed and updated.  Current Outpatient Prescriptions on File Prior to Visit  Medication Sig Dispense Refill  . alendronate (FOSAMAX) 70 MG tablet Take 1 tablet (70 mg total) by mouth once a week. Take with a full glass of water on an empty stomach. 15 tablet 3  . amLODipine (NORVASC) 10 MG tablet Take 1 tablet (10 mg total) by mouth daily. 90 tablet 3  . aspirin 81 MG tablet Take 81 mg by mouth daily.    . Cholecalciferol 1000 UNITS capsule Take 1,000 Units by mouth daily.    [name] (GLUCOSAMINE MSM COMPLEX) TABS  Take 1 tablet by mouth 2 (two) times daily.    . Omega-3 Fatty Acids (FISH OIL) 1000 MG CAPS Take 1,000 mg by mouth 2 (two) times daily.    . pravastatin (PRAVACHOL) 40 MG tablet Take 1 tablet (40 mg total) by mouth daily. 90 tablet 3   No current facility-administered medications on file prior to visit.     Review of Systems:  As per HPI- otherwise negative.   Physical Examination: Vitals:   11/29/16 1019 11/29/16 1022  BP: (!) 149/90 (!) 143/84  Pulse: 78   Temp: 98.5 F (36.9 C)    Vitals:   11/29/16 1019  Weight: 176 lb 3.2 oz (79.9 kg)  Height: 5\' 3"  (1.6 m)   Body mass index is 31.21 kg/m. Ideal Body Weight: Weight in (lb) to have BMI = 25:  140.8  GEN: WDWN, NAD, Non-toxic, A & O x 3, overweight, looks well HEENT: Atraumatic, Normocephalic. Neck supple. No masses, No LAD.  Bilateral TM wnl, oropharynx normal.  PEERL,EOMI.   Ears and Nose: No external deformity. CV: RRR, No M/G/R. No JVD. No thrill. No extra heart sounds. PULM: CTA B, no wheezes, crackles, rhonchi. No retractions. No resp. distress. No accessory muscle use. EXTR: No c/c/e NEURO Normal gait.  Normal strength of all extremities, normal DTR of all extremities, normal romberg testing  PSYCH: Normally interactive. Conversant. Not depressed or anxious appearing.  Calm demeanor.    Assessment and Plan: Pre-syncope  Essential hypertension  Hyperparathyroidism, primary [facility])  Hospital discharge follow-up  Here today to follow-up a single pre-syncopal episode that occurred on 2/4. She is now feeling well again, but admits she is a bit nervous that her sx could return. She will plan to return to regular activities and will let me know if she has any recurrence of her symptoms   Signed Lamar Blinks, MD

## 2017-02-02 DIAGNOSIS — M159 Polyosteoarthritis, unspecified: Secondary | ICD-10-CM | POA: Diagnosis not present

## 2017-02-02 DIAGNOSIS — Z79899 Other long term (current) drug therapy: Secondary | ICD-10-CM | POA: Diagnosis not present

## 2017-02-02 DIAGNOSIS — E669 Obesity, unspecified: Secondary | ICD-10-CM | POA: Diagnosis not present

## 2017-02-02 DIAGNOSIS — Z6831 Body mass index (BMI) 31.0-31.9, adult: Secondary | ICD-10-CM | POA: Diagnosis not present

## 2017-02-02 DIAGNOSIS — M810 Age-related osteoporosis without current pathological fracture: Secondary | ICD-10-CM | POA: Diagnosis not present

## 2017-02-02 DIAGNOSIS — I10 Essential (primary) hypertension: Secondary | ICD-10-CM | POA: Diagnosis not present

## 2017-02-02 DIAGNOSIS — Z0000 Encounter for general adult medical examination without abnormal findings: Secondary | ICD-10-CM | POA: Diagnosis not present

## 2017-02-02 DIAGNOSIS — E785 Hyperlipidemia, unspecified: Secondary | ICD-10-CM | POA: Diagnosis not present

## 2017-02-02 DIAGNOSIS — Z7982 Long term (current) use of aspirin: Secondary | ICD-10-CM | POA: Diagnosis not present

## 2017-02-05 ENCOUNTER — Encounter: Payer: Self-pay | Admitting: Family Medicine

## 2017-02-05 DIAGNOSIS — Z1231 Encounter for screening mammogram for malignant neoplasm of breast: Secondary | ICD-10-CM | POA: Diagnosis not present

## 2017-03-20 ENCOUNTER — Telehealth: Payer: Self-pay

## 2017-03-20 NOTE — Telephone Encounter (Signed)
Patient is on the list for [employer] 2018 and may be a good candidate for an AWV. Please let me know if/when appt is scheduled.   

## 2017-03-21 NOTE — Telephone Encounter (Signed)
Left pt message asking to call Ebony Hail back directly at [phone] to schedule AWV. Thanks!  *can add on to 04/30/17 appt is pt is okay with that*

## 2017-03-30 NOTE — Telephone Encounter (Signed)
Scheduled 04/30/17 °

## 2017-04-26 NOTE — Progress Notes (Signed)
Subjective:   Erica Navarro is a 66 y.o. female who presents for an Initial Medicare Annual Wellness Visit.  The Patient was informed that the wellness visit is to identify future health risk and educate and initiate measures that can reduce risk for increased disease through the lifespan.   Describes health as fair, good or great? Good.   Review of Systems    No ROS.  Medicare Wellness Visit. Additional risk factors are reflected in the social history.  Cardiac Risk Factors include: advanced age (>65men, >45 women);hypertension;sedentary lifestyle Sleep patterns:  Naps everyday. Sleeps from [facility]. Wakes1-2x to urinate. Home Safety/Smoke Alarms: Feels safe in home. Smoke alarms in place.  Living environment; residence and Firearm Safety: Lives with disabled son. No stairs. No guns.  Seat Belt Safety/Bike Helmet: Wears seat belt.   Counseling:   Eye Exam- Pt declines.  Dental- Dental resource list provided.  Female:   Pap- Pt declines. Mammo-   Last 01/26/17:   BI-RADS Category 1-negative Dexa scan-   Last 12/03/15: osteoporosis    CCS- Last 08/09/09: multiple diminutive polyps removed. No pathology or recall on file.    Objective:    Today's Vitals   04/30/17 0814 04/30/17 0834  BP: (!) 150/80 (!) 142/84  Pulse: 68   SpO2: 98%   Weight: 183 lb 9.6 oz (83.3 kg)   Height: 5\' 3"  (1.6 m)    Body mass index is 32.52 kg/m.   Current Medications (verified) Outpatient Encounter Prescriptions as of 04/30/2017  Medication Sig  . alendronate (FOSAMAX) 70 MG tablet Take 1 tablet (70 mg total) by mouth once a week. Take with a full glass of water on an empty stomach.  Marland Kitchen amLODipine (NORVASC) 10 MG tablet Take 1 tablet (10 mg total) by mouth daily.  Marland Kitchen aspirin 81 MG tablet Take 81 mg by mouth daily.  . Cholecalciferol 1000 UNITS capsule Take 1,000 Units by mouth daily.  [name] (GLUCOSAMINE MSM COMPLEX) TABS Take 1 tablet by mouth 2 (two) times daily.  .  Omega-3 Fatty Acids (FISH OIL) 1000 MG CAPS Take 1,000 mg by mouth 2 (two) times daily.  . pravastatin (PRAVACHOL) 40 MG tablet Take 1 tablet (40 mg total) by mouth daily.   No facility-administered encounter medications on file as of 04/30/2017.     Allergies (verified) Lisinopril   History: Past Medical History:  Diagnosis Date  . Hyperlipidemia   . Hypertension    History reviewed. No pertinent surgical history. History reviewed. No pertinent family history. Social History   Occupational History  . Not on file.   Social History Main Topics  . Smoking status: Never Smoker  . Smokeless tobacco: Never Used  . Alcohol use No  . Drug use: No  . Sexual activity: No    Tobacco Counseling Counseling given: No   Activities of Daily Living In your present state of health, do you have any difficulty performing the following activities: 04/30/2017  Hearing? N  Vision? N  Difficulty concentrating or making decisions? N  Walking or climbing stairs? N  Dressing or bathing? N  Doing errands, shopping? N  Preparing Food and eating ? N  Using the Toilet? N  In the past six months, have you accidently leaked urine? N  Do you have problems with loss of bowel control? N  Managing your Medications? N  Managing your Finances? N  Housekeeping or managing your Housekeeping? N  Some recent data might be hidden    Immunizations and Health  Maintenance Immunization History  Administered Date(s) Administered  . Pneumococcal Conjugate-13 05/17/2015  . Pneumococcal Polysaccharide-23 10/30/2016  . Zoster 10/30/2016   Health Maintenance Due  Topic Date Due  . Samul Dada  08/23/1968    Patient Care Team: Copland, Gay Filler, MD as PCP - General (Family Medicine) Philemon Kingdom, MD as Consulting Physician (Internal Medicine)  Indicate any recent Medical Services you may have received from other than [facility] providers in the past year (date may be approximate).     Assessment:    This is a routine wellness examination for Erica Navarro. Physical assessment deferred to PCP.   Hearing/Vision screen  Visual Acuity Screening   Right eye Left eye Both eyes  Without correction: 20/20 20/25 20/25   With correction:     Hearing Screening Comments: Able to hear conversational tones w/o difficulty. No issues reported.  Passed whisper test.   Dietary issues and exercise activities discussed: Current Exercise Habits: The patient does not participate in regular exercise at present, Exercise limited by: None identified Diet (meal preparation, eat out, water intake, caffeinated beverages, dairy products, fruits and vegetables): well balanced Breakfast: Steak biscuit and tea. Lunch: Baked spaghetti and salad and bread. Pound cake slice. Dinner: left overs.      Goals      Patient Stated   . Weight (lb) < 175 lb (79.4 kg) (pt-stated)          Begin walking at 3-4x/ week.        Depression Screen PHQ 2/9 Scores 04/30/2017 11/29/2016 11/17/2015 05/17/2015  PHQ - 2 Score 0 0 0 0    Fall Risk Fall Risk  04/30/2017 11/29/2016 11/17/2015 05/17/2015  Falls in the past year? Yes No No No  Number falls in past yr: 1 - - -  Injury with Fall? No - - -  Follow up Education provided;Falls prevention discussed - - -    Cognitive Function: MMSE - Mini Mental State Exam 04/30/2017  Orientation to time 5  Orientation to Place 5  Registration 3  Attention/ Calculation 5  Recall 3  Language- name 2 objects 2  Language- repeat 1  Language- follow 3 step command 3  Language- read & follow direction 1  Write a sentence 1  Copy design 1  Total score 30        Screening Tests Health Maintenance  Topic Date Due  . TETANUS/TDAP  08/23/1968  . INFLUENZA VACCINE  05/23/2017  . MAMMOGRAM  02/06/2019  . COLONOSCOPY  08/10/2019  . DEXA SCAN  Completed  . Hepatitis C Screening  Completed  . PNA vac Low Risk Adult  Completed      Plan:   Follow up with PCP today as scheduled.  Continue to  eat heart healthy diet (full of fruits, vegetables, whole grains, lean protein, water--limit salt, fat, and sugar intake) and increase physical activity as tolerated.  Continue doing brain stimulating activities (puzzles, reading, adult coloring books, staying active) to keep memory sharp.     I have personally reviewed and noted the following in the patient's chart:   . Medical and social history . Use of alcohol, tobacco or illicit drugs  . Current medications and supplements . Functional ability and status . Nutritional status . Physical activity . Advanced directives . List of other physicians . Hospitalizations, surgeries, and ER visits in previous 12 months . Vitals . Screenings to include cognitive, depression, and falls . Referrals and appointments  In addition, I have reviewed and discussed with patient certain  preventive protocols, quality metrics, and best practice recommendations. A written personalized care plan for preventive services as well as general preventive health recommendations were provided to patient.     Shela Nevin, [place]   04/30/2017

## 2017-04-29 NOTE — Progress Notes (Signed)
[place] at [facility] [address] [place], [place] [address] [phone]  Date:  04/30/2017   Name:  Erica Navarro   DOB:  [birth year]   MRN:  [id number]  PCP:  Erica Mclean, MD    Chief Complaint: Medicare Wellness (with RN)   History of Present Illness:  Erica Navarro is a 66 y.o. very pleasant female patient who presents with the following:  History of HTN, high cholesterol, osteoporosis, vitamin D insuf and primary hyperparathyroidism.   She is due to see endocrinology for her annual visit soon Erica Navarro) Last tetanus: she is not quite sure, we think done at [facility].  Will fax a request for immunization records today  She needs some refills today She just ate a few crackers so far today  However she is UTD on labs so will not draw blood today  She will start checking her BP at home   BP Readings from Last 3 Encounters:  04/30/17 (!) 142/84  11/29/16 (!) 143/84  11/26/16 123/72    Patient Active Problem List   Diagnosis Date Noted  . Ventral hernia without obstruction or gangrene 10/30/2016  . Sedentary lifestyle 10/30/2016  . Osteoporosis 06/02/2016  . Vitamin D insufficiency 06/02/2016  . Primary hyperparathyroidism ([place]) 06/04/2015  . HTN (hypertension) 11/11/2012  . High cholesterol 11/11/2012    Past Medical History:  Diagnosis Date  . Hyperlipidemia   . Hypertension     History reviewed. No pertinent surgical history.  Social History  Substance Use Topics  . Smoking status: Never Smoker  . Smokeless tobacco: Never Used  . Alcohol use No    History reviewed. No pertinent family history.  Allergies  Allergen Reactions  . Lisinopril     REACTION: hands and face swell    Medication list has been reviewed and updated.  Current Outpatient Prescriptions on File Prior to Visit  Medication Sig Dispense Refill  . alendronate (FOSAMAX) 70 MG tablet Take 1 tablet (70 mg total) by mouth once a  week. Take with a full glass of water on an empty stomach. 15 tablet 3  . amLODipine (NORVASC) 10 MG tablet Take 1 tablet (10 mg total) by mouth daily. 90 tablet 3  . aspirin 81 MG tablet Take 81 mg by mouth daily.    . Cholecalciferol 1000 UNITS capsule Take 1,000 Units by mouth daily.    Erica Navarro (GLUCOSAMINE MSM COMPLEX) TABS Take 1 tablet by mouth 2 (two) times daily.    . Omega-3 Fatty Acids (FISH OIL) 1000 MG CAPS Take 1,000 mg by mouth 2 (two) times daily.    . pravastatin (PRAVACHOL) 40 MG tablet Take 1 tablet (40 mg total) by mouth daily. 90 tablet 3   No current facility-administered medications on file prior to visit.     Review of Systems:  As per HPI- otherwise negative.   Physical Examination: Vitals:   04/30/17 0814 04/30/17 0834  BP: (!) 150/80 (!) 142/84  Pulse: 68    Vitals:   04/30/17 0814  Weight: 183 lb 9.6 oz (83.3 kg)  Height: 5\' 3"  (1.6 m)   Body mass index is 32.52 kg/m. Ideal Body Weight: Weight in (lb) to have BMI = 25: 140.8  GEN: WDWN, NAD, Non-toxic, A & O x 3, overweight, otherwise looks well HEENT: Atraumatic, Normocephalic. Neck supple. No masses, No LAD. Ears and Nose: No external deformity. CV: RRR, No M/G/R. No JVD. No thrill. No extra heart  sounds. PULM: CTA B, no wheezes, crackles, rhonchi. No retractions. No resp. distress. No accessory muscle use. ABD: S, NT, ND, +BS. No rebound. No HSM. EXTR: No c/c/e NEURO Normal gait.  PSYCH: Normally interactive. Conversant. Not depressed or anxious appearing.  Calm demeanor.    Assessment and Plan: Essential hypertension  Hyperparathyroidism, primary ([place])  Mixed hyperlipidemia  History of vitamin D deficiency  Medication monitoring encounter  Encounter for Medicare annual wellness exam  Other osteoporosis without current pathological fracture - Plan: alendronate (FOSAMAX) 70 MG tablet  Here today for her MWE exam and also a follow-up with me Her BP is a bit  high- she will start monitoring at home with a goal of less than 130/85.  Refilled her fosamax today She will see me in the early spring for a visit and repeat labs  Signed Erica Blinks, MD  I have reviewed MWE note by Ms. Britt and agree with her documentation

## 2017-04-30 ENCOUNTER — Encounter: Payer: Self-pay | Admitting: Family Medicine

## 2017-04-30 ENCOUNTER — Ambulatory Visit (INDEPENDENT_AMBULATORY_CARE_PROVIDER_SITE_OTHER): Payer: Medicare HMO | Admitting: Family Medicine

## 2017-04-30 VITALS — BP 142/84 | HR 68 | Ht 63.0 in | Wt 183.6 lb

## 2017-04-30 DIAGNOSIS — Z0000 Encounter for general adult medical examination without abnormal findings: Secondary | ICD-10-CM

## 2017-04-30 DIAGNOSIS — E782 Mixed hyperlipidemia: Secondary | ICD-10-CM | POA: Diagnosis not present

## 2017-04-30 DIAGNOSIS — Z8639 Personal history of other endocrine, nutritional and metabolic disease: Secondary | ICD-10-CM

## 2017-04-30 DIAGNOSIS — I10 Essential (primary) hypertension: Secondary | ICD-10-CM | POA: Diagnosis not present

## 2017-04-30 DIAGNOSIS — E210 Primary hyperparathyroidism: Secondary | ICD-10-CM

## 2017-04-30 DIAGNOSIS — Z5181 Encounter for therapeutic drug level monitoring: Secondary | ICD-10-CM | POA: Diagnosis not present

## 2017-04-30 DIAGNOSIS — M818 Other osteoporosis without current pathological fracture: Secondary | ICD-10-CM | POA: Diagnosis not present

## 2017-04-30 MED ORDER — ALENDRONATE SODIUM 70 MG PO TABS: 70.0000 mg | ORAL_TABLET | ORAL | 3 refills | Status: DC

## 2017-04-30 NOTE — Patient Instructions (Addendum)
  It was a pleasure to see you today! Take care, we refilled your fosamax today.  Please see me in January/ February for fasting labs/ recheck   I am going to ask [facility] for your past shot records.  We will try to find out if you had a tetanus shot recently  Ms. Erica Navarro , Thank you for taking time to come for your Medicare Wellness Visit. I appreciate your ongoing commitment to your health goals. Please review the following plan we discussed and let me know if I can assist you in the future.   These are the goals we discussed: Goals      Patient Stated   . Weight (lb) < 175 lb (79.4 kg) (pt-stated)          Begin walking at 3-4x/ week.         This is a list of the screening recommended for you and due dates:  Health Maintenance  Topic Date Due  . Tetanus Vaccine  08/23/1968  . Flu Shot  05/23/2017  . Mammogram  02/06/2019  . Colon Cancer Screening  08/10/2019  . DEXA scan (bone density measurement)  Completed  .  Hepatitis C: One time screening is recommended by Center for Disease Control  (CDC) for  adults born from [birth year] through 1967.   Completed  . Pneumonia vaccines  Completed   Continue to eat heart healthy diet (full of fruits, vegetables, whole grains, lean protein, water--limit salt, fat, and sugar intake) and increase physical activity as tolerated.  Continue doing brain stimulating activities (puzzles, reading, adult coloring books, staying active) to keep memory sharp.

## 2017-06-01 ENCOUNTER — Ambulatory Visit (INDEPENDENT_AMBULATORY_CARE_PROVIDER_SITE_OTHER): Payer: Medicare HMO | Admitting: Internal Medicine

## 2017-06-01 ENCOUNTER — Encounter: Payer: Self-pay | Admitting: Internal Medicine

## 2017-06-01 VITALS — BP 126/88 | HR 80 | Wt 186.0 lb

## 2017-06-01 DIAGNOSIS — E210 Primary hyperparathyroidism: Secondary | ICD-10-CM | POA: Diagnosis not present

## 2017-06-01 DIAGNOSIS — E559 Vitamin D deficiency, unspecified: Secondary | ICD-10-CM

## 2017-06-01 DIAGNOSIS — M818 Other osteoporosis without current pathological fracture: Secondary | ICD-10-CM | POA: Diagnosis not present

## 2017-06-01 LAB — BASIC METABOLIC PANEL WITH GFR
BUN: 15 mg/dL (ref 7–25)
CO2: 27 mmol/L (ref 20–32)
Calcium: 10.7 mg/dL — ABNORMAL HIGH (ref 8.6–10.4)
Chloride: 105 mmol/L (ref 98–110)
Creat: 0.6 mg/dL (ref 0.50–0.99)
GFR, Est African American: >89 mL/min (ref >=60)
GFR, Est Non African American: >89 mL/min (ref >=60)
Glucose, Bld: 80 mg/dL (ref 65–99)
Potassium: 4.6 mmol/L (ref 3.5–5.3)
Sodium: 139 mmol/L (ref 135–146)

## 2017-06-01 LAB — VITAMIN D 25 HYDROXY (VIT D DEFICIENCY, FRACTURES): VITD: 47.02 ng/mL (ref 30.00–100.00)

## 2017-06-01 NOTE — Patient Instructions (Signed)
Please continue Fosamax 70 mg daily.   Continue vitamin D 3000 units daily.  Please stop at the lab.  Please return in 1 year.

## 2017-06-01 NOTE — Progress Notes (Signed)
Patient ID: Erica Navarro, female   DOB: [birth year], 66 y.o.   MRN: [id number]   HPI  Erica Navarro is a 66 y.o.-year-old female, returning for f/u for primary hyperparathyroidism, vit D insufficiency, and OP. Last visit 1 year ago.  Reviewed and addended history: Pt was dx with hypercalcemia in 10/2014. I reviewed pt's pertinent labs: Lab Results  Component Value Date   PTH 43 06/02/2016   PTH Comment 06/02/2016   PTH 73 (H) 06/04/2015   PTH Comment 06/04/2015   PTH 82 (H) 12/04/2014   PTH 104 (H) 11/24/2014   CALCIUM 10.3 11/26/2016   CALCIUM 10.8 (H) 10/30/2016   CALCIUM 10.6 (H) 06/02/2016   CALCIUM 10.3 11/17/2015   CALCIUM 10.9 (H) 06/04/2015   CALCIUM 10.9 (H) 12/04/2014   CALCIUM 10.7 (H) 11/24/2014   CALCIUM 10.8 (H) 11/10/2014   CALCIUM 10.5 11/10/2013   CALCIUM 10.4 11/11/2012   I reviewed Her most recent DEXA scan. She does have osteoporosis:  12/03/2015 [facility])  Lumbar spine (L1-L4) Femoral neck (FN)  T-score - 3.6 RFN: - 2.1 LFN: - 2.4  She was on Fosamax before, And we restarted this a year ago. She is currently taking 70 mg weekly. No side effects.  No fractures or falls.  No kidney stones  No CKD. Last BUN/Cr: Lab Results  Component Value Date   BUN 16 11/26/2016   CREATININE 0.76 11/26/2016   Pt is not on HCTZ.  + h/o vitamin D insufficiency: Lab Results  Component Value Date   VD25OH 42.29 10/30/2016   VD25OH 36.55 06/02/2016   VD25OH 23.45 (L) 06/04/2015   VD25OH 37.83 12/04/2014   Patient is currently taking 3000 units of vitamin D daily, dose increased in 2016  Pt does not have a FH of hypercalcemia, pituitary tumors, thyroid cancer, or osteoporosis.   Patient has mild primary hyperparathyroidism. Her 24-hour urine calcium level was normal, however, the rest of the labs were consistent with the above diagnosis. I did suggest a referral to surgery for possible parathyroidectomy, but she refused, preferring only follow-up.  Reviewed  pertinent initial labs:  Component     Latest Ref Rng 12/04/2014  BUN     6 - 23 mg/dL 16  Creatinine     0.40 - 1.20 mg/dL 0.63  Calcium     8.4 - 10.5 mg/dL 10.9 (H)  GFR     >60.00 mL/min 121.86  Vitamin D 1, 25 (OH) Total     18 - 72 pg/mL 96 (H)  Vitamin D3 1, 25 (OH)      96  Vitamin D2 1, 25 (OH)      <8  PTH     15 - 65 pg/mL 82 (H)  Calcium Ionized     1.12 - 1.32 mmol/L 1.50 (H)  VITD     30.00 - 100.00 ng/mL 37.83  Phosphorus     2.3 - 4.6 mg/dL 2.7  Magnesium     1.5 - 2.5 mg/dL 1.9   Component     Latest Ref Rng 01/11/2015 01/11/2015         3:03 PM  3:07 PM  Calcium, Ur      4   Calcium, 24 hour urine     100 - 250 mg/day 112   Creatinine, Urine      50.4 50.5  Creatinine, 24H Ur     700 - 1800 mg/day 1412 1415   24h calcium not elevated. FeCa= 0.0045 (0.45%). This is lower than expected with  primary hyperparathyroidism, however, there is low suspicion for familial hypocalciuric hypercalcemia due to previously normal calcium levels and also normal magnesium levels.   She also has a history of HTN, HL.  ROS: Constitutional: + weight gain (icecream every day)/no weight loss, no fatigue, + hot flushes, no subjective hypothermia Eyes: no blurry vision, no xerophthalmia ENT: no sore throat, no nodules palpated in throat, no dysphagia, no odynophagia, no hoarseness Cardiovascular: no CP/no SOB/no palpitations/no leg swelling Respiratory: no cough/no SOB/no wheezing Gastrointestinal: no N/no V/no D/no C/no acid reflux Musculoskeletal: no muscle aches/no joint aches Skin: no rashes, no hair loss Neurological: no tremors/no numbness/no tingling/no dizziness  I reviewed pt's medications, allergies, PMH, social hx, family hx, and changes were documented in the history of present illness. Otherwise, unchanged from my initial visit note.  Past Medical History:  Diagnosis Date  . Hyperlipidemia   . Hypertension    No past surgical history on file. History    Social History  . Marital Status: Single    Spouse Name: N/A  . Number of Children: 4   Occupational History  . janitor   Social History Main Topics  . Smoking status: Never Smoker   . Smokeless tobacco: Not on file  . Alcohol Use: No  . Drug Use: No   Current Outpatient Prescriptions on File Prior to Visit  Medication Sig Dispense Refill  . alendronate (FOSAMAX) 70 MG tablet Take 1 tablet (70 mg total) by mouth once a week. Take with a full glass of water on an empty stomach. 15 tablet 3  . amLODipine (NORVASC) 10 MG tablet Take 1 tablet (10 mg total) by mouth daily. 90 tablet 3  . aspirin 81 MG tablet Take 81 mg by mouth daily.    . Cholecalciferol 1000 UNITS capsule Take 1,000 Units by mouth daily.    [name] (GLUCOSAMINE MSM COMPLEX) TABS Take 1 tablet by mouth 2 (two) times daily.    . Omega-3 Fatty Acids (FISH OIL) 1000 MG CAPS Take 1,000 mg by mouth 2 (two) times daily.    . pravastatin (PRAVACHOL) 40 MG tablet Take 1 tablet (40 mg total) by mouth daily. 90 tablet 3   No current facility-administered medications on file prior to visit.    Allergies  Allergen Reactions  . Lisinopril     REACTION: hands and face swell   FH: - see HPI + - HTN and heart ds in mother - HL in sister  PE: BP 126/88   Pulse 80   Wt 186 lb (84.4 kg)   SpO2 97%   BMI 32.95 kg/m  Body mass index is 32.95 kg/m.  Wt Readings from Last 3 Encounters:  06/01/17 186 lb (84.4 kg)  04/30/17 183 lb 9.6 oz (83.3 kg)  11/29/16 176 lb 3.2 oz (79.9 kg)   Constitutional: overweight, in NAD Eyes: PERRLA, EOMI, no exophthalmos ENT: moist mucous membranes, no thyromegaly, no cervical lymphadenopathy Cardiovascular: RRR, No MRG Respiratory: CTA B Gastrointestinal: abdomen soft, NT, ND, BS+ Musculoskeletal: no deformities, strength intact in all 4 Skin: moist, warm, no rashes Neurological: no tremor with outstretched hands, DTR normal in all 4  Assessment: 1. Primary  hyperparathyroidism  2. Vitamin D insufficiency  3. OP  Plan: Patient has had Several instances of elevated calcium, with the highest level being 10.9. An intact PTH level was also high, at 104 for a calcium of 10.7. However, her PTH has decreased since then, with a normal level, 43 at last visit. Her latest calcium  level was normal, at 10.3 - She has a history of vitamin D insufficiency, corrected at last check with over-the-counter vitamin D - She has no history of nephrolithiasis, no fractures, but she does have osteoporosis. - We again discussed that she has mild primary hyperparathyroidism. She is refusing surgery, preferring only to continue monitoring her calcium and PTH for now. - She is aware of possible consequences of hyperparathyroidism: Nephrolithiasis and worsening of osteoporosis - We reviewed together the reports of her most recent DEXA  scan, from 11/2015, which showed osteoporosis. We started a bisphosphonate once a week since then. We discussed that the Fosamax will also help decreasing her calcium level. - Today we will check: Calcium + GFR Intact PTH ([facility]) 25 hydroxy vitamin D  - I will see the patient back in 1 year and she will need a new DEXA scan then (or after 11/2016)  2. Vitamin D insufficiency - Patient had a slightly low vitamin D level, after which we increased her vitamin D supplementation to 3000 units daily. She continues this dose. Subsequent vitamin D levels normalized. - We will check annual level today  3. OP - We started Fosamax 70 mg weekly at last visit. No side effects. No dental work coming up. No GERD. - She is aware of possible side effects: ONJ and atypical fractures - no GERD   Component     Latest Ref Rng & Units 06/01/2017  Sodium     135 - 146 mmol/L 139  Potassium     3.5 - 5.3 mmol/L 4.6  Chloride     98 - 110 mmol/L 105  CO2     20 - 32 mmol/L 27  Glucose     65 - 99 mg/dL 80  BUN     7 - 25 mg/dL 15  Creatinine      0.50 - 0.99 mg/dL 0.60  Calcium     8.6 - 10.4 mg/dL 10.7 (H)  GFR, Est Non African American     >=60 mL/min >89  GFR, Est African American     >=60 mL/min >89  PTH, Intact     15 - 65 pg/mL 46  VITD     30.00 - 100.00 ng/mL 47.02   Slightly high Ca with normal vit D, nonsuppressed PTH and normal GFR. We can continue to follow her for now per her preference.  Philemon Kingdom, MD PhD [facility]

## 2017-06-02 LAB — PARATHYROID HORMONE, INTACT (NO CA): PTH: 46 pg/mL (ref 15–65)

## 2017-06-04 ENCOUNTER — Telehealth: Payer: Self-pay

## 2017-06-04 NOTE — Telephone Encounter (Signed)
-----   Message from Philemon Kingdom, MD sent at 06/04/2017  1:20 PM EDT ----- Almyra Free, can you please call pt: Slightly high Ca with normal vit D, normal PTH and normal GFR. We can continue to follow her for now per her preference.

## 2017-06-04 NOTE — Telephone Encounter (Signed)
Called patient and gave lab results. Patient had no questions or concerns.  

## 2017-09-14 ENCOUNTER — Encounter: Payer: Self-pay | Admitting: Family Medicine

## 2017-09-14 ENCOUNTER — Ambulatory Visit (INDEPENDENT_AMBULATORY_CARE_PROVIDER_SITE_OTHER): Payer: Medicare HMO | Admitting: Family Medicine

## 2017-09-14 VITALS — BP 120/90 | Temp 98.7°F | Ht 63.0 in | Wt 184.0 lb

## 2017-09-14 DIAGNOSIS — H6502 Acute serous otitis media, left ear: Secondary | ICD-10-CM | POA: Diagnosis not present

## 2017-09-14 DIAGNOSIS — J0140 Acute pansinusitis, unspecified: Secondary | ICD-10-CM

## 2017-09-14 DIAGNOSIS — H6692 Otitis media, unspecified, left ear: Secondary | ICD-10-CM | POA: Insufficient documentation

## 2017-09-14 MED ORDER — AMOXICILLIN-POT CLAVULANATE 875-125 MG PO TABS: 1.0000 | ORAL_TABLET | Freq: Two times a day (BID) | ORAL | 0 refills | Status: DC

## 2017-09-14 MED ORDER — FLUTICASONE PROPIONATE 50 MCG/ACT NA SUSP: 2.0000 | Freq: Every day | NASAL | 6 refills | Status: DC

## 2017-09-14 MED ORDER — LORATADINE 10 MG PO TABS: 10.0000 mg | ORAL_TABLET | Freq: Every day | ORAL | 11 refills | Status: DC

## 2017-09-14 NOTE — Progress Notes (Signed)
Patient ID: Erica Navarro, female    DOB: [birth year]  Age: 66 y.o. MRN: [id number]    Subjective:  Subjective  HPI Erica Navarro presents for cold symptoms and L ear drainage and pain since Tuesday.  Pt tried coricidan , and lemon juice and green tea with little relief.  + sinus pressure and L ear pressure.    Review of Systems  Constitutional: Negative for chills and fever.  HENT: Positive for congestion, ear discharge, ear pain, postnasal drip, rhinorrhea and sinus pressure. Negative for sore throat.   Respiratory: Positive for cough. Negative for chest tightness, shortness of breath and wheezing.   Cardiovascular: Negative for chest pain, palpitations and leg swelling.  Allergic/Immunologic: Negative for environmental allergies.    History Past Medical History:  Diagnosis Date  . Hyperlipidemia   . Hypertension     She has no past surgical history on file.   Her family history is not on file.She reports that  has never smoked. she has never used smokeless tobacco. She reports that she does not drink alcohol or use drugs.  Current Outpatient Medications on File Prior to Visit  Medication Sig Dispense Refill  . alendronate (FOSAMAX) 70 MG tablet Take 1 tablet (70 mg total) by mouth once a week. Take with a full glass of water on an empty stomach. 15 tablet 3  . amLODipine (NORVASC) 10 MG tablet Take 1 tablet (10 mg total) by mouth daily. 90 tablet 3  . aspirin 81 MG tablet Take 81 mg by mouth daily.    . Cholecalciferol 1000 UNITS capsule Take 1,000 Units by mouth daily.    [name] (GLUCOSAMINE MSM COMPLEX) TABS Take 1 tablet by mouth 2 (two) times daily.    . Omega-3 Fatty Acids (FISH OIL) 1000 MG CAPS Take 1,000 mg by mouth 2 (two) times daily.    . pravastatin (PRAVACHOL) 40 MG tablet Take 1 tablet (40 mg total) by mouth daily. 90 tablet 3   No current facility-administered medications on file prior to visit.      Objective:  Objective  Physical Exam   Constitutional: She is oriented to person, place, and time. She appears well-developed and well-nourished.  HENT:  Right Ear: Tympanic membrane, external ear and ear canal normal.  Left Ear: There is swelling. Tympanic membrane is injected and erythematous.  Nose: Right sinus exhibits maxillary sinus tenderness and frontal sinus tenderness. Left sinus exhibits maxillary sinus tenderness and frontal sinus tenderness.  Mouth/Throat: Posterior oropharyngeal erythema present.  + PND + errythema  Eyes: Conjunctivae are normal. Right eye exhibits no discharge. Left eye exhibits no discharge.  Cardiovascular: Normal rate, regular rhythm and normal heart sounds.  No murmur heard. Pulmonary/Chest: Effort normal and breath sounds normal. No respiratory distress. She has no wheezes. She has no rales. She exhibits no tenderness.  Musculoskeletal: She exhibits no edema.  Lymphadenopathy:    She has cervical adenopathy.  Neurological: She is alert and oriented to person, place, and time.  Nursing note and vitals reviewed.  BP 120/90   Temp 98.7 F (37.1 C) (Oral)   Ht 5\' 3"  (1.6 m)   Wt 184 lb (83.5 kg)   BMI 32.59 kg/m  Wt Readings from Last 3 Encounters:  09/14/17 184 lb (83.5 kg)  06/01/17 186 lb (84.4 kg)  04/30/17 183 lb 9.6 oz (83.3 kg)     Lab Results  Component Value Date   WBC 6.4 11/26/2016   HGB 13.1 11/26/2016   HCT 39.6 11/26/2016  PLT 284 11/26/2016   GLUCOSE 80 06/01/2017   CHOL 250 (H) 10/30/2016   TRIG 71.0 10/30/2016   HDL 66.00 10/30/2016   LDLCALC 170 (H) 10/30/2016   ALT 12 10/30/2016   AST 16 10/30/2016   NA 139 06/01/2017   K 4.6 06/01/2017   CL 105 06/01/2017   CREATININE 0.60 06/01/2017   BUN 15 06/01/2017   CO2 27 06/01/2017   TSH 1.04 10/30/2016    No results found.   Assessment & Plan:  Plan  I am having Erica Navarro start on amoxicillin-clavulanate, fluticasone, and loratadine. I am also having her maintain her Fish Oil, aspirin, GLUCOSAMINE  MSM COMPLEX, Cholecalciferol, pravastatin, amLODipine, and alendronate.  Meds ordered this encounter  Medications  . amoxicillin-clavulanate (AUGMENTIN) 875-125 MG tablet    Sig: Take 1 tablet by mouth 2 (two) times daily.    Dispense:  20 tablet    Refill:  0  . fluticasone (FLONASE) 50 MCG/ACT nasal spray    Sig: Place 2 sprays into both nostrils daily.    Dispense:  16 g    Refill:  6  . loratadine (CLARITIN) 10 MG tablet    Sig: Take 1 tablet (10 mg total) by mouth daily.    Dispense:  30 tablet    Refill:  11    Problem List Items Addressed This Visit      Unprioritized   Left otitis media    abx per orders F/u pcp if no improvement      Relevant Medications   amoxicillin-clavulanate (AUGMENTIN) 875-125 MG tablet    Other Visit Diagnoses    Acute pansinusitis, recurrence not specified    -  Primary   Relevant Medications   amoxicillin-clavulanate (AUGMENTIN) 875-125 MG tablet   fluticasone (FLONASE) 50 MCG/ACT nasal spray   loratadine (CLARITIN) 10 MG tablet      Follow-up: Return if symptoms worsen or fail to improve. ((

## 2017-09-14 NOTE — Assessment & Plan Note (Signed)
abx per orders F/u pcp if no improvement

## 2017-09-14 NOTE — Patient Instructions (Signed)

## 2017-10-27 NOTE — Progress Notes (Addendum)
[facility] [address] [place], [place] [address] [phone]  Date:  10/31/2017   Name:  Erica Navarro   DOB:  Mar 16, 1951   MRN:  [id number]  PCP:  Darreld Mclean, MD    Chief Complaint: Follow-up (Pt here for f/u visit. )   History of Present Illness:  Erica Navarro is a 67 y.o. very pleasant female patient who presents with the following:  Last seen by myself in July-  Here today for her MWE exam and also a follow-up with me Her BP is a bit high- she will start monitoring at home with a goal of less than 130/85.  Refilled her fosamax today She will see me in the early spring for a visit and repeat labs  Need to repeat her calcium today as it was slightly elevated in August  She did have a head cold over Thanksgiving and was seen at the [place] office She had some crackers so far today, nothing else   She does have mild primary hyperparathyroidism, managed by Dr. Cruzita Lederer. For the time being they are following and she has not had a parathyyroidectomy  BP Readings from Last 3 Encounters:  10/31/17 138/82  09/14/17 120/90  06/01/17 126/88   She is using amlodipine 10 mg for her BP and pravachol for her lipids  She is not sure of the date of her last tetanus - however no recent wound so we will defer due to medicare non- payment She notes that her BP has been generally under good control She is feeling well, her family is well No CP or SOB No GI complaints, no skin complaints   Patient Active Problem List   Diagnosis Date Noted  . Left otitis media 09/14/2017  . Ventral hernia without obstruction or gangrene 10/30/2016  . Sedentary lifestyle 10/30/2016  . Osteoporosis 06/02/2016  . Vitamin D insufficiency 06/02/2016  . Primary hyperparathyroidism ([place]) 06/04/2015  . HTN (hypertension) 11/11/2012  . High cholesterol 11/11/2012    Past Medical History:  Diagnosis Date  . Hyperlipidemia   . Hypertension      No past surgical history on file.  Social History   Tobacco Use  . Smoking status: Never Smoker  . Smokeless tobacco: Never Used  Substance Use Topics  . Alcohol use: No  . Drug use: No    No family history on file.  Allergies  Allergen Reactions  . Lisinopril     REACTION: hands and face swell    Medication list has been reviewed and updated.  Current Outpatient Medications on File Prior to Visit  Medication Sig Dispense Refill  . alendronate (FOSAMAX) 70 MG tablet Take 1 tablet (70 mg total) by mouth once a week. Take with a full glass of water on an empty stomach. 15 tablet 3  . amLODipine (NORVASC) 10 MG tablet Take 1 tablet (10 mg total) by mouth daily. 90 tablet 3  . aspirin 81 MG tablet Take 81 mg by mouth daily.    . Cholecalciferol 1000 UNITS capsule Take 1,000 Units by mouth daily.    . fluticasone (FLONASE) 50 MCG/ACT nasal spray Place 2 sprays into both nostrils daily. (Patient taking differently: Place 2 sprays into both nostrils as needed. ) 16 g 6  . Glucos-MSM-C-Mn-Ginger-Willow (GLUCOSAMINE MSM COMPLEX) TABS Take 1 tablet by mouth 2 (two) times daily.    . Omega-3 Fatty Acids (FISH OIL) 1000 MG CAPS Take 1,000 mg by  mouth 2 (two) times daily.    . pravastatin (PRAVACHOL) 40 MG tablet Take 1 tablet (40 mg total) by mouth daily. 90 tablet 3   No current facility-administered medications on file prior to visit.     Review of Systems:  As per HPI- otherwise negative.   Physical Examination: Vitals:   10/31/17 0858  BP: 138/82  Pulse: 74  Temp: 98.1 F (36.7 C)  SpO2: 97%   Vitals:   10/31/17 0858  Weight: 181 lb 3.2 oz (82.2 kg)  Height: 5\' 3"  (1.6 m)   Body mass index is 32.1 kg/m. Ideal Body Weight: Weight in (lb) to have BMI = 25: 140.8  GEN: WDWN, NAD, Non-toxic, A & O x 3 HEENT: Atraumatic, Normocephalic. Neck supple. No masses, No LAD.  Bilateral TM wnl, oropharynx normal.  PEERL,EOMI.   Ears and Nose: No external deformity. CV:  RRR, No M/G/R. No JVD. No thrill. No extra heart sounds. PULM: CTA B, no wheezes, crackles, rhonchi. No retractions. No resp. distress. No accessory muscle use. ABD: S, NT, ND. No rebound. No HSM. Ventral hernia  EXTR: No c/c/e NEURO Normal gait.  PSYCH: Normally interactive. Conversant. Not depressed or anxious appearing.  Calm demeanor.  Looks well and younger than age.  Overweight   Assessment and Plan: Essential hypertension - Plan: CBC  Primary hyperparathyroidism ([place]) - Plan: Comprehensive metabolic panel, Vitamin D (25 hydroxy)  Mixed hyperlipidemia - Plan: Lipid panel  History of vitamin D deficiency - Plan: Vitamin D (25 hydroxy)  Medication monitoring encounter - Plan: Comprehensive metabolic panel, CBC  Elevated glucose level - Plan: Hemoglobin A1c  Here today for a follow-up visit Labs pending as above BP under ok control today She sees Gherghe for her hyperparathyroidism will check on her vit D and calcium for her today Noted elevated glucose on chart- will screen for pre-diabetes with an A1c today   Signed Lamar Blinks, MD  Received her labs    Metabolic profile is normal Cholesterol is overall ok Vitamin D level is normal A1c does not show any diabetes or pre-diabetes Overall good news!  Take care and please see me in about 6 months  Results for orders placed or performed in visit on 10/31/17  Comprehensive metabolic panel  Result Value Ref Range   Sodium 138 135 - 145 mEq/L   Potassium 4.6 3.5 - 5.1 mEq/L   Chloride 103 96 - 112 mEq/L   CO2 29 19 - 32 mEq/L   Glucose, Bld 95 70 - 99 mg/dL   BUN 15 6 - 23 mg/dL   Creatinine, Ser 0.57 0.40 - 1.20 mg/dL   Total Bilirubin 0.6 0.2 - 1.2 mg/dL   Alkaline Phosphatase 70 39 - 117 U/L   AST 16 0 - 37 U/L   ALT 12 0 - 35 U/L   Total Protein 7.8 6.0 - 8.3 g/dL   Albumin 4.3 3.5 - 5.2 g/dL   Calcium 10.5 8.4 - 10.5 mg/dL   GFR 135.57 >60.00 mL/min  CBC  Result Value Ref Range   WBC 5.0 4.0 - 10.5  K/uL   RBC 4.43 3.87 - 5.11 Mil/uL   Platelets 306.0 150.0 - 400.0 K/uL   Hemoglobin 12.7 12.0 - 15.0 g/dL   HCT 38.5 36.0 - 46.0 %   MCV 87.0 78.0 - 100.0 fl   MCHC 33.1 30.0 - 36.0 g/dL   RDW 14.6 11.5 - 15.5 %  Lipid panel  Result Value Ref Range   Cholesterol 214 (H)  0 - 200 mg/dL   Triglycerides 55.0 0.0 - 149.0 mg/dL   HDL 58.60 >39.00 mg/dL   VLDL 11.0 0.0 - 40.0 mg/dL   LDL Cholesterol 144 (H) 0 - 99 mg/dL   Total CHOL/HDL Ratio 4    NonHDL 155.28   Vitamin D (25 hydroxy)  Result Value Ref Range   VITD 48.93 30.00 - 100.00 ng/mL  Hemoglobin A1c  Result Value Ref Range   Hgb A1c MFr Bld 5.6 4.6 - 6.5 %   Letter to pt

## 2017-10-31 ENCOUNTER — Encounter: Payer: Self-pay | Admitting: Family Medicine

## 2017-10-31 ENCOUNTER — Ambulatory Visit (INDEPENDENT_AMBULATORY_CARE_PROVIDER_SITE_OTHER): Payer: Medicare HMO | Admitting: Family Medicine

## 2017-10-31 VITALS — BP 132/82 | HR 74 | Temp 98.1°F | Ht 63.0 in | Wt 181.2 lb

## 2017-10-31 DIAGNOSIS — E782 Mixed hyperlipidemia: Secondary | ICD-10-CM | POA: Diagnosis not present

## 2017-10-31 DIAGNOSIS — Z8639 Personal history of other endocrine, nutritional and metabolic disease: Secondary | ICD-10-CM | POA: Diagnosis not present

## 2017-10-31 DIAGNOSIS — E210 Primary hyperparathyroidism: Secondary | ICD-10-CM

## 2017-10-31 DIAGNOSIS — I10 Essential (primary) hypertension: Secondary | ICD-10-CM

## 2017-10-31 DIAGNOSIS — Z5181 Encounter for therapeutic drug level monitoring: Secondary | ICD-10-CM | POA: Diagnosis not present

## 2017-10-31 DIAGNOSIS — R7309 Other abnormal glucose: Secondary | ICD-10-CM | POA: Diagnosis not present

## 2017-10-31 LAB — LIPID PANEL
Cholesterol: 214 mg/dL — ABNORMAL HIGH (ref 0–200)
HDL: 58.6 mg/dL (ref >39.00)
LDL Cholesterol: 144 mg/dL — ABNORMAL HIGH (ref 0–99)
NonHDL: 155.28
Total CHOL/HDL Ratio: 4
Triglycerides: 55 mg/dL (ref 0.0–149.0)
VLDL: 11 mg/dL (ref 0.0–40.0)

## 2017-10-31 LAB — COMPREHENSIVE METABOLIC PANEL
ALT: 12 U/L (ref 0–35)
AST: 16 U/L (ref 0–37)
Albumin: 4.3 g/dL (ref 3.5–5.2)
Alkaline Phosphatase: 70 U/L (ref 39–117)
BUN: 15 mg/dL (ref 6–23)
CO2: 29 mEq/L (ref 19–32)
Calcium: 10.5 mg/dL (ref 8.4–10.5)
Chloride: 103 mEq/L (ref 96–112)
Creatinine, Ser: 0.57 mg/dL (ref 0.40–1.20)
GFR: 135.57 mL/min (ref >60.00)
Glucose, Bld: 95 mg/dL (ref 70–99)
Potassium: 4.6 mEq/L (ref 3.5–5.1)
Sodium: 138 mEq/L (ref 135–145)
Total Bilirubin: 0.6 mg/dL (ref 0.2–1.2)
Total Protein: 7.8 g/dL (ref 6.0–8.3)

## 2017-10-31 LAB — HEMOGLOBIN A1C: Hgb A1c MFr Bld: 5.6 % (ref 4.6–6.5)

## 2017-10-31 LAB — CBC
HCT: 38.5 % (ref 36.0–46.0)
Hemoglobin: 12.7 g/dL (ref 12.0–15.0)
MCHC: 33.1 g/dL (ref 30.0–36.0)
MCV: 87 fl (ref 78.0–100.0)
Platelets: 306 10*3/uL (ref 150.0–400.0)
RBC: 4.43 Mil/uL (ref 3.87–5.11)
RDW: 14.6 % (ref 11.5–15.5)
WBC: 5 10*3/uL (ref 4.0–10.5)

## 2017-10-31 LAB — VITAMIN D 25 HYDROXY (VIT D DEFICIENCY, FRACTURES): VITD: 48.93 ng/mL (ref 30.00–100.00)

## 2017-10-31 NOTE — Patient Instructions (Addendum)
It was a pleasure to see you as always!  I will be in touch with your labs asap  Your BP looks good today- continue current regimen  Let's plan to visit in about 6 months

## 2018-01-21 ENCOUNTER — Telehealth: Payer: Self-pay | Admitting: Family Medicine

## 2018-01-21 DIAGNOSIS — I10 Essential (primary) hypertension: Secondary | ICD-10-CM

## 2018-01-21 DIAGNOSIS — E782 Mixed hyperlipidemia: Secondary | ICD-10-CM

## 2018-01-21 DIAGNOSIS — M818 Other osteoporosis without current pathological fracture: Secondary | ICD-10-CM

## 2018-01-21 MED ORDER — AMLODIPINE BESYLATE 10 MG PO TABS: 10.0000 mg | ORAL_TABLET | Freq: Every day | ORAL | 3 refills | Status: DC

## 2018-01-21 MED ORDER — PRAVASTATIN SODIUM 40 MG PO TABS: 40.0000 mg | ORAL_TABLET | Freq: Every day | ORAL | 3 refills | Status: DC

## 2018-01-21 NOTE — Telephone Encounter (Signed)
Copied from [place]. Topic: Referral - Request >> Jan 21, 2018 10:13 AM Neva Seat wrote:  Referral needed for a bone density test.

## 2018-01-21 NOTE — Telephone Encounter (Signed)
Copied from [place] [id number]. Topic: Quick Communication - Rx Refill/Question >> Jan 21, 2018 10:10 AM Neva Seat wrote: amLODipine (NORVASC) 10 MG tablet pravastatin (PRAVACHOL) 40 MG tablet  Pt almost out - needing refills  [id number] [address], [place] - [address] [place] [address] Phone: [phone] Fax: [phone]

## 2018-01-26 ENCOUNTER — Other Ambulatory Visit: Payer: Self-pay | Admitting: Family Medicine

## 2018-01-26 DIAGNOSIS — I10 Essential (primary) hypertension: Secondary | ICD-10-CM

## 2018-01-28 ENCOUNTER — Other Ambulatory Visit: Payer: Self-pay | Admitting: *Deleted

## 2018-01-28 DIAGNOSIS — E782 Mixed hyperlipidemia: Secondary | ICD-10-CM

## 2018-01-28 DIAGNOSIS — I10 Essential (primary) hypertension: Secondary | ICD-10-CM

## 2018-01-28 MED ORDER — PRAVASTATIN SODIUM 40 MG PO TABS: 40.0000 mg | ORAL_TABLET | Freq: Every day | ORAL | 3 refills | Status: DC

## 2018-01-28 MED ORDER — AMLODIPINE BESYLATE 10 MG PO TABS: 10.0000 mg | ORAL_TABLET | Freq: Every day | ORAL | 3 refills | Status: DC

## 2018-02-06 DIAGNOSIS — Z1231 Encounter for screening mammogram for malignant neoplasm of breast: Secondary | ICD-10-CM | POA: Diagnosis not present

## 2018-04-27 NOTE — Progress Notes (Deleted)
[place] at [facility] [address] [place], [place] [address] [phone]  Date:  05/01/2018   Name:  Erica Navarro   DOB:  1951/01/21   MRN:  [id number]  PCP:  Darreld Mclean, MD    Chief Complaint: No chief complaint on file.   History of Present Illness:  Erica Navarro is a 67 y.o. very pleasant female patient who presents with the following:  Periodic follow-up visit today History of HTN, hyperlipidemia, osteoporosis, primary hyperparathyroidism  I last saw her in January as follows: Need to repeat her calcium today as it was slightly elevated in August  She did have a head cold over Thanksgiving and was seen at the [place] office She had some crackers so far today, nothing else  She does have mild primary hyperparathyroidism, managed by Dr. Cruzita Lederer. For the time being they are following and she has not had a parathyyroidectomy She is using amlodipine 10 mg for her BP and pravachol for her lipids She is not sure of the date of her last tetanus - however no recent wound so we will defer due to medicare non- payment She notes that her BP has been generally under good control  Most recent lab notes: Metabolic profile is normal Cholesterol is overall ok Vitamin D level is normal A1c does not show any diabetes or pre-diabetes Overall good news!  Take care and please see me in about 6 months    Seeing endocrinology next month- Gherghe Tetanus:  Would she like to do labs today?  Dexa:  2.5 years ago.  She is on fosamax   Patient Active Problem List   Diagnosis Date Noted  . Ventral hernia without obstruction or gangrene 10/30/2016  . Sedentary lifestyle 10/30/2016  . Osteoporosis 06/02/2016  . Vitamin D insufficiency 06/02/2016  . Primary hyperparathyroidism ([place]) 06/04/2015  . HTN (hypertension) 11/11/2012  . High cholesterol 11/11/2012    Past Medical History:  Diagnosis Date  . Hyperlipidemia   .  Hypertension     No past surgical history on file.  Social History   Tobacco Use  . Smoking status: Never Smoker  . Smokeless tobacco: Never Used  Substance Use Topics  . Alcohol use: No  . Drug use: No    No family history on file.  Allergies  Allergen Reactions  . Lisinopril     REACTION: hands and face swell    Medication list has been reviewed and updated.  Current Outpatient Medications on File Prior to Visit  Medication Sig Dispense Refill  . alendronate (FOSAMAX) 70 MG tablet Take 1 tablet (70 mg total) by mouth once a week. Take with a full glass of water on an empty stomach. 15 tablet 3  . amLODipine (NORVASC) 10 MG tablet Take 1 tablet (10 mg total) by mouth daily. 90 tablet 3  . aspirin 81 MG tablet Take 81 mg by mouth daily.    . Cholecalciferol 1000 UNITS capsule Take 1,000 Units by mouth daily.    . fluticasone (FLONASE) 50 MCG/ACT nasal spray Place 2 sprays into both nostrils daily. (Patient taking differently: Place 2 sprays into both nostrils as needed. ) 16 g 6  . Glucos-MSM-C-Mn-Ginger-Willow (GLUCOSAMINE MSM COMPLEX) TABS Take 1 tablet by mouth 2 (two) times daily.    . Omega-3 Fatty Acids (FISH OIL) 1000 MG CAPS Take 1,000 mg by mouth 2 (two) times daily.    . pravastatin (PRAVACHOL) 40 MG tablet Take 1  tablet (40 mg total) by mouth daily. 90 tablet 3   No current facility-administered medications on file prior to visit.     Review of Systems:  As per HPI- otherwise negative.   Physical Examination: There were no vitals filed for this visit. There were no vitals filed for this visit. There is no height or weight on file to calculate BMI. Ideal Body Weight:    GEN: WDWN, NAD, Non-toxic, A & O x 3 HEENT: Atraumatic, Normocephalic. Neck supple. No masses, No LAD. Ears and Nose: No external deformity. CV: RRR, No M/G/R. No JVD. No thrill. No extra heart sounds. PULM: CTA B, no wheezes, crackles, rhonchi. No retractions. No resp. distress. No  accessory muscle use. ABD: S, NT, ND, +BS. No rebound. No HSM. EXTR: No c/c/e NEURO Normal gait.  PSYCH: Normally interactive. Conversant. Not depressed or anxious appearing.  Calm demeanor.    Assessment and Plan: ***  Signed Lamar Blinks, MD

## 2018-05-01 ENCOUNTER — Ambulatory Visit: Payer: Medicare HMO | Admitting: *Deleted

## 2018-05-01 ENCOUNTER — Ambulatory Visit: Payer: Medicare HMO | Admitting: Family Medicine

## 2018-05-10 ENCOUNTER — Encounter: Payer: Self-pay | Admitting: Family Medicine

## 2018-05-28 DIAGNOSIS — I10 Essential (primary) hypertension: Secondary | ICD-10-CM | POA: Diagnosis not present

## 2018-05-28 DIAGNOSIS — Z6833 Body mass index (BMI) 33.0-33.9, adult: Secondary | ICD-10-CM | POA: Diagnosis not present

## 2018-05-28 DIAGNOSIS — M810 Age-related osteoporosis without current pathological fracture: Secondary | ICD-10-CM | POA: Diagnosis not present

## 2018-05-28 DIAGNOSIS — Z7983 Long term (current) use of bisphosphonates: Secondary | ICD-10-CM | POA: Diagnosis not present

## 2018-05-28 DIAGNOSIS — E785 Hyperlipidemia, unspecified: Secondary | ICD-10-CM | POA: Diagnosis not present

## 2018-05-28 DIAGNOSIS — Z888 Allergy status to other drugs, medicaments and biological substances status: Secondary | ICD-10-CM | POA: Diagnosis not present

## 2018-05-28 DIAGNOSIS — E669 Obesity, unspecified: Secondary | ICD-10-CM | POA: Diagnosis not present

## 2018-05-28 DIAGNOSIS — Z7982 Long term (current) use of aspirin: Secondary | ICD-10-CM | POA: Diagnosis not present

## 2018-05-28 NOTE — Progress Notes (Signed)
[place] at [facility] [address], [place], [place] [address] [phone]  Date:  05/30/2018   Name:  Erica Navarro   DOB:  1951-09-02   MRN:  [id number]  PCP:  Darreld Mclean, MD    Chief Complaint: Hypertension (6 month follow up, management of vitamin supplements) and Fatigue (not feeling 100%, body aches, denies chest pain)   History of Present Illness:  Erica Navarro is a 67 y.o. very pleasant female patient who presents with the following:  6 month follow-up today History of HTN, hyperlipidemia and hyperparathyroidism I last saw her in January of this year:  BP under ok control today She sees Gherghe for her hyperparathyroidism will check on her vit D and calcium for her today Noted elevated glucose on chart- will screen for pre-diabetes with an A1c today  Endocrinology appt later on this month Full labs done in January- her A1c was normal  Lab Results  Component Value Date   HGBA1C 5.6 10/31/2017   amlodipine pravachol Fosamax  dexa 2/17- now due, will order for her  Pt notes that she seems to have a bit of a "cold" over the last 5 days, which she describes as generally feeling a bit weak  She awoke one morning and noted that her left arm hurt.  This was present off and on for a couple of days and then resolved, no longer present  No chest pain She feels "weak" off an on but really cannot describe this further to me today No cough or fever, no ST No SOB Her chest does not feel tight or heavy She feels stuffy in her sinuses No nausea or vomiting No diarrhea She is eating ok but is trying to eat healthy   Pt never had any heart issues that we can recall Never a smoker  No urinary sx noted  Patient Active Problem List   Diagnosis Date Noted  . Ventral hernia without obstruction or gangrene 10/30/2016  . Sedentary lifestyle 10/30/2016  . Osteoporosis 06/02/2016  . Vitamin D insufficiency 06/02/2016  .  Primary hyperparathyroidism ([place]) 06/04/2015  . HTN (hypertension) 11/11/2012  . High cholesterol 11/11/2012    Past Medical History:  Diagnosis Date  . Hyperlipidemia   . Hypertension     No past surgical history on file.  Social History   Tobacco Use  . Smoking status: Never Smoker  . Smokeless tobacco: Never Used  Substance Use Topics  . Alcohol use: No  . Drug use: No    No family history on file.  Allergies  Allergen Reactions  . Lisinopril     REACTION: hands and face swell    Medication list has been reviewed and updated.  Current Outpatient Medications on File Prior to Visit  Medication Sig Dispense Refill  . alendronate (FOSAMAX) 70 MG tablet Take 1 tablet (70 mg total) by mouth once a week. Take with a full glass of water on an empty stomach. 15 tablet 3  . amLODipine (NORVASC) 10 MG tablet Take 1 tablet (10 mg total) by mouth daily. 90 tablet 3  . aspirin 81 MG tablet Take 81 mg by mouth daily.    . Cholecalciferol 1000 UNITS capsule Take 1,000 Units by mouth daily.    . fluticasone (FLONASE) 50 MCG/ACT nasal spray Place 2 sprays into both nostrils daily. (Patient taking differently: Place 2 sprays into both nostrils as needed. ) 16 g 6  . Glucos-MSM-C-Mn-Ginger-Willow (GLUCOSAMINE MSM  COMPLEX) TABS Take 1 tablet by mouth 2 (two) times daily.    . Omega-3 Fatty Acids (FISH OIL) 1000 MG CAPS Take 1,000 mg by mouth 2 (two) times daily.    . pravastatin (PRAVACHOL) 40 MG tablet Take 1 tablet (40 mg total) by mouth daily. 90 tablet 3   No current facility-administered medications on file prior to visit.     Review of Systems:  As per HPI- otherwise negative. No fever or chills  BP Readings from Last 3 Encounters:  05/30/18 138/80  10/31/17 132/82  09/14/17 120/90     Physical Examination: Vitals:   05/30/18 1120  BP: 138/80  Pulse: 77  Resp: 16  Temp: 98 F (36.7 C)  SpO2: 98%   Vitals:   05/30/18 1120  Weight: 184 lb (83.5 kg)  Height:  5\' 3"  (1.6 m)   Body mass index is 32.59 kg/m. Ideal Body Weight: Weight in (lb) to have BMI = 25: 140.8  GEN: WDWN, NAD, Non-toxic, A & O x 3, looks well, overweight  HEENT: Atraumatic, Normocephalic. Neck supple. No masses, No LAD.  Bilateral TM wnl, oropharynx normal.  PEERL,EOMI.   Ears and Nose: No external deformity. CV: RRR, No M/G/R. No JVD. No thrill. No extra heart sounds. PULM: CTA B, no wheezes, crackles, rhonchi. No retractions. No resp. distress. No accessory muscle use. ABD: S, NT, ND, +BS. No rebound. No HSM.  Small burn on belly from a recent cooking steam accident  EXTR: No c/c/e NEURO Normal gait.  PSYCH: Normally interactive. Conversant. Not depressed or anxious appearing.  Calm demeanor.   EKG: SR with RA enlargement.  Compared with 11/2016 no acute or concerning changes noted   Assessment and Plan: Essential hypertension  Mixed hyperlipidemia  Other osteoporosis without current pathological fracture - Plan: DG Bone Density  Feeling weak - Plan: CBC, Comprehensive metabolic panel, EKG 30-SPQZ, POCT urinalysis dipstick  Primary hyperparathyroidism (HCC) - Plan: Comprehensive metabolic panel, Vitamin D (25 hydroxy)  History of vitamin D deficiency - Plan: Comprehensive metabolic panel  Here today for a routine visit but also feeling not quite right Urine is benign, EKG normal Obtain other labs as above and will be in touch with her Encouraged her to rest, but to seek help if not feeling back to normal soon.     Signed Lamar Blinks, MD  Received her labs- her calcium is up a bit, not to a dangerous or generally symptomatic level Results for orders placed or performed in visit on 05/30/18  CBC  Result Value Ref Range   WBC 6.1 4.0 - 10.5 K/uL   RBC 4.57 3.87 - 5.11 Mil/uL   Platelets 297.0 150.0 - 400.0 K/uL   Hemoglobin 13.0 12.0 - 15.0 g/dL   HCT 39.4 36.0 - 46.0 %   MCV 86.2 78.0 - 100.0 fl   MCHC 33.0 30.0 - 36.0 g/dL   RDW 14.6 11.5 - 15.5 %   Comprehensive metabolic panel  Result Value Ref Range   Sodium 136 135 - 145 mEq/L   Potassium 4.3 3.5 - 5.1 mEq/L   Chloride 100 96 - 112 mEq/L   CO2 28 19 - 32 mEq/L   Glucose, Bld 119 (H) 70 - 99 mg/dL   BUN 16 6 - 23 mg/dL   Creatinine, Ser 0.59 0.40 - 1.20 mg/dL   Total Bilirubin 0.6 0.2 - 1.2 mg/dL   Alkaline Phosphatase 80 39 - 117 U/L   AST 16 0 - 37 U/L   ALT  12 0 - 35 U/L   Total Protein 8.0 6.0 - 8.3 g/dL   Albumin 4.3 3.5 - 5.2 g/dL   Calcium 11.3 (H) 8.4 - 10.5 mg/dL   GFR 130.06 >60.00 mL/min  Vitamin D (25 hydroxy)  Result Value Ref Range   VITD 50.51 30.00 - 100.00 ng/mL  POCT urinalysis dipstick  Result Value Ref Range   Color, UA yellow yellow   Clarity, UA clear clear   Glucose, UA negative negative mg/dL   Bilirubin, UA negative negative   Ketones, POC UA negative negative mg/dL   Spec Grav, UA 1.015 1.010 - 1.025   Blood, UA negative negative   pH, UA 6.0 5.0 - 8.0   Protein Ur, POC negative negative mg/dL   Urobilinogen, UA 0.2 0.2 or 1.0 E.U./dL   Nitrite, UA Negative Negative   Leukocytes, UA Negative Negative   Letter to pt

## 2018-05-30 ENCOUNTER — Encounter: Payer: Self-pay | Admitting: Family Medicine

## 2018-05-30 ENCOUNTER — Ambulatory Visit (INDEPENDENT_AMBULATORY_CARE_PROVIDER_SITE_OTHER): Payer: Medicare HMO | Admitting: Family Medicine

## 2018-05-30 VITALS — BP 138/80 | HR 77 | Temp 98.0°F | Resp 16 | Ht 63.0 in | Wt 184.0 lb

## 2018-05-30 DIAGNOSIS — E210 Primary hyperparathyroidism: Secondary | ICD-10-CM

## 2018-05-30 DIAGNOSIS — M818 Other osteoporosis without current pathological fracture: Secondary | ICD-10-CM

## 2018-05-30 DIAGNOSIS — Z8639 Personal history of other endocrine, nutritional and metabolic disease: Secondary | ICD-10-CM | POA: Diagnosis not present

## 2018-05-30 DIAGNOSIS — R531 Weakness: Secondary | ICD-10-CM

## 2018-05-30 DIAGNOSIS — I10 Essential (primary) hypertension: Secondary | ICD-10-CM

## 2018-05-30 DIAGNOSIS — E782 Mixed hyperlipidemia: Secondary | ICD-10-CM | POA: Diagnosis not present

## 2018-05-30 LAB — CBC
HCT: 39.4 % (ref 36.0–46.0)
Hemoglobin: 13 g/dL (ref 12.0–15.0)
MCHC: 33 g/dL (ref 30.0–36.0)
MCV: 86.2 fl (ref 78.0–100.0)
Platelets: 297 10*3/uL (ref 150.0–400.0)
RBC: 4.57 Mil/uL (ref 3.87–5.11)
RDW: 14.6 % (ref 11.5–15.5)
WBC: 6.1 10*3/uL (ref 4.0–10.5)

## 2018-05-30 LAB — POCT URINALYSIS DIP (MANUAL ENTRY)
Bilirubin, UA: NEGATIVE
Blood, UA: NEGATIVE
Glucose, UA: NEGATIVE mg/dL
Ketones, POC UA: NEGATIVE mg/dL
Leukocytes, UA: NEGATIVE
Nitrite, UA: NEGATIVE
Protein Ur, POC: NEGATIVE mg/dL
Spec Grav, UA: 1.015 (ref 1.010–1.025)
Urobilinogen, UA: 0.2 E.U./dL
pH, UA: 6 (ref 5.0–8.0)

## 2018-05-30 LAB — COMPREHENSIVE METABOLIC PANEL
ALT: 12 U/L (ref 0–35)
AST: 16 U/L (ref 0–37)
Albumin: 4.3 g/dL (ref 3.5–5.2)
Alkaline Phosphatase: 80 U/L (ref 39–117)
BUN: 16 mg/dL (ref 6–23)
CO2: 28 mEq/L (ref 19–32)
Calcium: 11.3 mg/dL — ABNORMAL HIGH (ref 8.4–10.5)
Chloride: 100 mEq/L (ref 96–112)
Creatinine, Ser: 0.59 mg/dL (ref 0.40–1.20)
GFR: 130.06 mL/min (ref >60.00)
Glucose, Bld: 119 mg/dL — ABNORMAL HIGH (ref 70–99)
Potassium: 4.3 mEq/L (ref 3.5–5.1)
Sodium: 136 mEq/L (ref 135–145)
Total Bilirubin: 0.6 mg/dL (ref 0.2–1.2)
Total Protein: 8 g/dL (ref 6.0–8.3)

## 2018-05-30 LAB — VITAMIN D 25 HYDROXY (VIT D DEFICIENCY, FRACTURES): VITD: 50.51 ng/mL (ref 30.00–100.00)

## 2018-05-30 NOTE — Patient Instructions (Signed)
It was good to see you today Your EKG is reassuring We will be in touch with your labs asap I will also arrange for a bone density scan for you If you have any worsening of your "cold" symptoms or if not feeling back to normal soon please alert me!

## 2018-06-11 ENCOUNTER — Ambulatory Visit (INDEPENDENT_AMBULATORY_CARE_PROVIDER_SITE_OTHER): Payer: Medicare HMO | Admitting: Internal Medicine

## 2018-06-11 VITALS — BP 138/82 | HR 71 | Ht 63.0 in | Wt 184.0 lb

## 2018-06-11 DIAGNOSIS — E210 Primary hyperparathyroidism: Secondary | ICD-10-CM

## 2018-06-11 DIAGNOSIS — E559 Vitamin D deficiency, unspecified: Secondary | ICD-10-CM | POA: Diagnosis not present

## 2018-06-11 DIAGNOSIS — M818 Other osteoporosis without current pathological fracture: Secondary | ICD-10-CM | POA: Diagnosis not present

## 2018-06-11 NOTE — Progress Notes (Addendum)
Patient ID: Erica Navarro, female   DOB: 1951-09-08, 67 y.o.   MRN: [id number]   HPI  Erica Navarro is a 67 y.o.-year-old female, returning for f/u for primary hyperparathyroidism, vit D insufficiency, and OP. Last visit 1 year ago.  Reviewed and addended history: Pt was dx with hypercalcemia in 10/2014.  I reviewed patient's pertinent labs: Lab Results  Component Value Date   PTH 46 06/01/2017   PTH 43 06/02/2016   PTH Comment 06/02/2016   PTH 73 (H) 06/04/2015   PTH Comment 06/04/2015   PTH 82 (H) 12/04/2014   PTH 104 (H) 11/24/2014   CALCIUM 11.3 (H) 05/30/2018   CALCIUM 10.5 10/31/2017   CALCIUM 10.7 (H) 06/01/2017   CALCIUM 10.3 11/26/2016   CALCIUM 10.8 (H) 10/30/2016   CALCIUM 10.6 (H) 06/02/2016   CALCIUM 10.3 11/17/2015   CALCIUM 10.9 (H) 06/04/2015   CALCIUM 10.9 (H) 12/04/2014   CALCIUM 10.7 (H) 11/24/2014   She has osteoporosis on her most recent DXA scan:  12/03/2015 [facility])  Lumbar spine (L1-L4) Femoral neck (FN)  T-score - 3.6 RFN: - 2.1 LFN: - 2.4   She was on Fosamax before and we restarted this in 2017, after the above results returned.  She is currently taking 70 mg weekly.  No hip, thigh, jaw pain.  No fractures or falls.  No kidney stones.  No CKD. Last BUN/Cr: Lab Results  Component Value Date   BUN 16 05/30/2018   CREATININE 0.59 05/30/2018   She is not on HCTZ.  She has a history of vitamin D insufficiency, however, this normalized: Lab Results  Component Value Date   VD25OH 50.51 05/30/2018   VD25OH 48.93 10/31/2017   VD25OH 47.02 06/01/2017   VD25OH 42.29 10/30/2016   VD25OH 36.55 06/02/2016   VD25OH 23.45 (L) 06/04/2015   VD25OH 37.83 12/04/2014   Her vitamin D dose is 3000 units daily, dose increased in 2016.  Pt does not have a FH of hypercalcemia, pituitary tumors, thyroid cancer, or osteoporosis.   Patient has mild primary hyperparathyroidism. Her 24-hour urine calcium level was normal, however, the rest of the labs were  consistent with the above diagnosis.  I did suggest a referral to surgery for possible parathyroidectomy but she refused, preferring only follow-up.  Reviewed pertinent initial labs:  Component     Latest Ref Rng 12/04/2014  BUN     6 - 23 mg/dL 16  Creatinine     0.40 - 1.20 mg/dL 0.63  Calcium     8.4 - 10.5 mg/dL 10.9 (H)  GFR     >60.00 mL/min 121.86  Vitamin D 1, 25 (OH) Total     18 - 72 pg/mL 96 (H)  Vitamin D3 1, 25 (OH)      96  Vitamin D2 1, 25 (OH)      <8  PTH     15 - 65 pg/mL 82 (H)  Calcium Ionized     1.12 - 1.32 mmol/L 1.50 (H)  VITD     30.00 - 100.00 ng/mL 37.83  Phosphorus     2.3 - 4.6 mg/dL 2.7  Magnesium     1.5 - 2.5 mg/dL 1.9   Component     Latest Ref Rng 01/11/2015 01/11/2015         3:03 PM  3:07 PM  Calcium, Ur      4   Calcium, 24 hour urine     100 - 250 mg/day 112   Creatinine, Urine  50.4 50.5  Creatinine, 24H Ur     700 - 1800 mg/day 1412 1415   Her 24-hour urine calcium was not elevated. FeCa= 0.0045 (0.45%). This is lower than expected with primary hyperparathyroidism, however, there is low suspicion for familial hypocalciuric hypercalcemia due to previously normal calcium levels and also normal magnesium levels.  She also has a history of HTN, HL.  ROS: Constitutional: no weight gain/no weight loss, no fatigue, no subjective hyperthermia, no subjective hypothermia Eyes: no blurry vision, no xerophthalmia ENT: no sore throat, no nodules palpated in throat, no dysphagia, no odynophagia, no hoarseness Cardiovascular: no CP/no SOB/no palpitations/no leg swelling Respiratory: no cough/no SOB/no wheezing Gastrointestinal: no N/no V/no D/no C/no acid reflux Musculoskeletal: no muscle aches/no joint aches Skin: no rashes, no hair loss Neurological: no tremors/no numbness/no tingling/no dizziness  I reviewed pt's medications, allergies, PMH, social hx, family hx, and changes were documented in the history of present illness.  Otherwise, unchanged from my initial visit note.   Past Medical History:  Diagnosis Date  . Hyperlipidemia   . Hypertension    No past surgical history on file. History   Social History  . Marital Status: Single    Spouse Name: N/A  . Number of Children: 4   Occupational History  . janitor   Social History Main Topics  . Smoking status: Never Smoker   . Smokeless tobacco: Not on file  . Alcohol Use: No  . Drug Use: No   Current Outpatient Medications on File Prior to Visit  Medication Sig Dispense Refill  . alendronate (FOSAMAX) 70 MG tablet Take 1 tablet (70 mg total) by mouth once a week. Take with a full glass of water on an empty stomach. 15 tablet 3  . amLODipine (NORVASC) 10 MG tablet Take 1 tablet (10 mg total) by mouth daily. 90 tablet 3  . aspirin 81 MG tablet Take 81 mg by mouth daily.    . Cholecalciferol 1000 UNITS capsule Take 1,000 Units by mouth daily.    . fluticasone (FLONASE) 50 MCG/ACT nasal spray Place 2 sprays into both nostrils daily. (Patient taking differently: Place 2 sprays into both nostrils as needed. ) 16 g 6  . Glucos-MSM-C-Mn-Ginger-Willow (GLUCOSAMINE MSM COMPLEX) TABS Take 1 tablet by mouth 2 (two) times daily.    . Omega-3 Fatty Acids (FISH OIL) 1000 MG CAPS Take 1,000 mg by mouth 2 (two) times daily.    . pravastatin (PRAVACHOL) 40 MG tablet Take 1 tablet (40 mg total) by mouth daily. 90 tablet 3   No current facility-administered medications on file prior to visit.    Allergies  Allergen Reactions  . Lisinopril     REACTION: hands and face swell   FH: - see HPI + - HTN and heart ds in mother - HL in sister  PE: BP 138/82   Pulse 71   Ht 5\' 3"  (1.6 m)   Wt 184 lb (83.5 kg)   SpO2 97%   BMI 32.59 kg/m  Body mass index is 32.59 kg/m.  Wt Readings from Last 3 Encounters:  06/11/18 184 lb (83.5 kg)  05/30/18 184 lb (83.5 kg)  10/31/17 181 lb 3.2 oz (82.2 kg)   Constitutional: overweight, in NAD Eyes: PERRLA, EOMI, no  exophthalmos ENT: moist mucous membranes, no gum inflammation or ulcers, no thyromegaly, no cervical lymphadenopathy Cardiovascular: RRR, No MRG Respiratory: CTA B Gastrointestinal: abdomen soft, NT, ND, BS+ Musculoskeletal: no deformities, strength intact in all 4 Skin: moist, warm, no rashes Neurological:  no tremor with outstretched hands, DTR normal in all 4  Assessment: 1. Primary hyperparathyroidism  2. Vitamin D insufficiency  3. OP  Plan: Patient has had several instances of elevated calcium, with the highest being 11.3  An intact PTH level was also high, at 104, for calcium of 10.7.  However, her PTH has decreased since then, with normal levels, 43 and 46, respectively, at last 2 visits. - She has a history of vitamin D deficiency, but her level was normal at last visit.  She continues over-the-counter vitamin D supplement. - She has no history of nephrolithiasis, fractures, but she does have osteoporosis - She has mild primary hyperparathyroidism.  She is refusing surgery again today, preferring only to continue monitoring her calcium and PTH for now.  She is aware of possible consequences of uncontrolled hyperparathyroidism, to include nephrolithiasis and worsening of her osteoporosis. - Reviewed together the reports of her more recent DEXA scan from 11/2015, which showed osteoporosis.  At that time, we started Fosamax.  We again discussed that Fosamax will also help decreasing her calcium level.  We will order new DEXA scan today. - Today we will check: Calcium  Intact PTH ([facility]) - I will see the patient back in 1 year.  2. Vitamin D insufficiency - She had a slightly low vitamin D level, after which we increase her vitamin D supplementation to 3000 units daily.  She continues on this dose.  Subsequent vitamin D level normalized. - Recent level was reviewed, from earlier this month, and this was excellent  3. OP - We started Fosamax 70 mg weekly in 2017.  She now is 2  years into the treatment.  No side effects.  No dental work in progress or coming up.  No GERD. - She is aware of possible side effects, ONJ and atypical fractures - She is due for another DXA scan >> needs to be at [facility], on the same machine >> will order  Component     Latest Ref Rng & Units 06/11/2018  Calcium     8.7 - 10.3 mg/dL 11.1 (H)  PTH, Intact     15 - 65 pg/mL 65   Calcium still high, and PTH of the upper limit of normal, unsuppressed.  06/17/2018 ([facility])  Lumbar spine (L1-L4) Femoral neck (FN)  T-score - 3.1 RFN: - 1.9 LFN: - 2.0   12/03/2015 ([facility])  Lumbar spine (L1-L4) Femoral neck (FN)  T-score - 3.6 RFN: - 2.1 LFN: - 2.4   All scores have improved, which is excellent.  Philemon Kingdom, MD PhD [facility] Endocrinology

## 2018-06-11 NOTE — Patient Instructions (Addendum)
Please continue Fosamax 70 mg weekly.   Continue vitamin D 3000 units daily.  Please stop at the lab.  Please return in 1 year.

## 2018-06-12 ENCOUNTER — Encounter: Payer: Self-pay | Admitting: Internal Medicine

## 2018-06-12 LAB — PTH, INTACT AND CALCIUM
Calcium: 11.1 mg/dL — ABNORMAL HIGH (ref 8.7–10.3)
PTH: 65 pg/mL (ref 15–65)

## 2018-06-14 ENCOUNTER — Other Ambulatory Visit: Payer: Self-pay | Admitting: Internal Medicine

## 2018-06-14 DIAGNOSIS — E210 Primary hyperparathyroidism: Secondary | ICD-10-CM

## 2018-06-17 DIAGNOSIS — M810 Age-related osteoporosis without current pathological fracture: Secondary | ICD-10-CM | POA: Diagnosis not present

## 2018-06-17 DIAGNOSIS — M8589 Other specified disorders of bone density and structure, multiple sites: Secondary | ICD-10-CM | POA: Diagnosis not present

## 2018-06-18 ENCOUNTER — Telehealth: Payer: Self-pay

## 2018-06-18 NOTE — Telephone Encounter (Signed)
-----   Message from Philemon Kingdom, MD sent at 06/18/2018  4:10 PM EDT ----- Erica Navarro, can you please call pt:  I received the results of her most recent bone density test from [facility], checked on 06/17/2018.  Great news: All the scores have improved.  If she needs details, I documented the results as an addendum to the last office visit note.  Please advise her to continue Fosamax for now. Ty, C

## 2018-06-18 NOTE — Telephone Encounter (Signed)
Pt is aware and has no questions or concerns at this time  

## 2018-06-25 ENCOUNTER — Telehealth: Payer: Self-pay | Admitting: *Deleted

## 2018-06-25 NOTE — Telephone Encounter (Signed)
Received Bone Density results from [facility]; forwarded to provider/SLS 09/03

## 2018-06-27 ENCOUNTER — Other Ambulatory Visit: Payer: Self-pay | Admitting: Family Medicine

## 2018-06-27 DIAGNOSIS — M818 Other osteoporosis without current pathological fracture: Secondary | ICD-10-CM

## 2018-08-14 ENCOUNTER — Other Ambulatory Visit (HOSPITAL_COMMUNITY): Payer: Self-pay | Admitting: Surgery

## 2018-08-14 DIAGNOSIS — E210 Primary hyperparathyroidism: Secondary | ICD-10-CM | POA: Diagnosis not present

## 2018-08-14 DIAGNOSIS — M810 Age-related osteoporosis without current pathological fracture: Secondary | ICD-10-CM | POA: Diagnosis not present

## 2018-09-04 ENCOUNTER — Encounter (HOSPITAL_COMMUNITY)
Admission: RE | Admit: 2018-09-04 | Discharge: 2018-09-04 | Disposition: A | Discharge status: Home / Self Care | Payer: Medicare HMO | Source: Ambulatory Visit | Attending: Surgery | Admitting: Surgery

## 2018-09-04 ENCOUNTER — Ambulatory Visit (HOSPITAL_COMMUNITY)
Admission: RE | Admit: 2018-09-04 | Discharge: 2018-09-04 | Disposition: A | Discharge status: Home / Self Care | Payer: Medicare HMO | Source: Ambulatory Visit | Attending: Surgery | Admitting: Surgery

## 2018-09-04 DIAGNOSIS — E210 Primary hyperparathyroidism: Secondary | ICD-10-CM

## 2018-09-04 DIAGNOSIS — E213 Hyperparathyroidism, unspecified: Secondary | ICD-10-CM | POA: Diagnosis not present

## 2018-09-04 DIAGNOSIS — E041 Nontoxic single thyroid nodule: Secondary | ICD-10-CM | POA: Diagnosis not present

## 2018-09-04 DIAGNOSIS — E042 Nontoxic multinodular goiter: Secondary | ICD-10-CM | POA: Insufficient documentation

## 2018-09-04 MED ORDER — TECHNETIUM TC 99M SESTAMIBI - CARDIOLITE
25.0000 | Freq: Once | INTRAVENOUS | Status: AC | PRN
  Administered 2018-09-04: 25 via INTRAVENOUS

## 2018-09-17 ENCOUNTER — Other Ambulatory Visit: Payer: Self-pay | Admitting: Surgery

## 2018-09-17 DIAGNOSIS — E041 Nontoxic single thyroid nodule: Secondary | ICD-10-CM

## 2018-10-01 ENCOUNTER — Ambulatory Visit
Admission: RE | Admit: 2018-10-01 | Discharge: 2018-10-01 | Disposition: A | Discharge status: Home / Self Care | Payer: Medicare HMO | Source: Ambulatory Visit | Attending: Surgery | Admitting: Surgery

## 2018-10-01 ENCOUNTER — Other Ambulatory Visit (HOSPITAL_COMMUNITY)
Admission: RE | Admit: 2018-10-01 | Discharge: 2018-10-01 | Disposition: A | Discharge status: Home / Self Care | Payer: Medicare HMO | Source: Ambulatory Visit | Attending: Radiology | Admitting: Radiology

## 2018-10-01 DIAGNOSIS — E041 Nontoxic single thyroid nodule: Secondary | ICD-10-CM | POA: Diagnosis not present

## 2018-11-15 IMAGING — US US THYROID
1 series · 13 of 25 positions shown · non-contrast
Comparison: None.

CLINICAL DATA: Other.  Hyperparathyroidism.

EXAM:
THYROID ULTRASOUND
TECHNIQUE: Ultrasound examination of the thyroid gland and adjacent soft
tissues was performed.

[Series 1: us thyroid · 0.07mm/px · 13 of 43 slices shown]
[im 1/43]
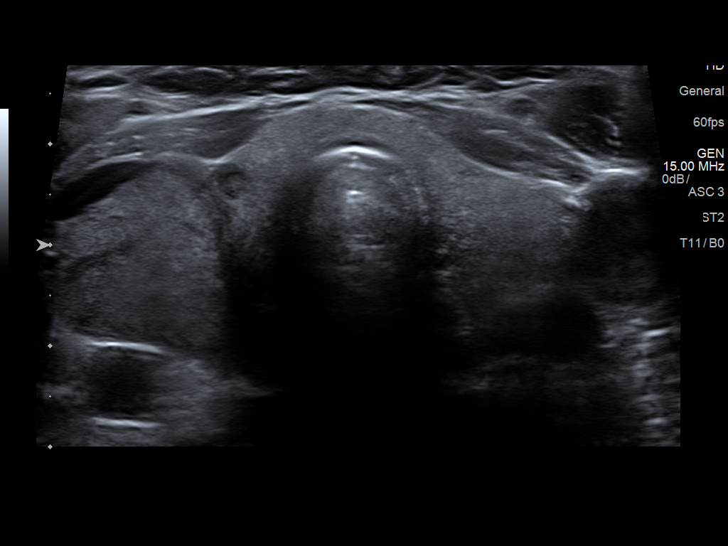
[im 4/43]
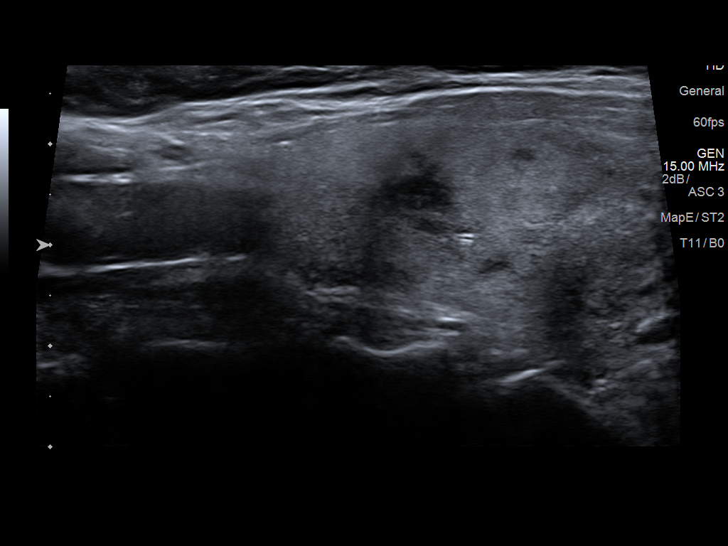
[im 8/43]
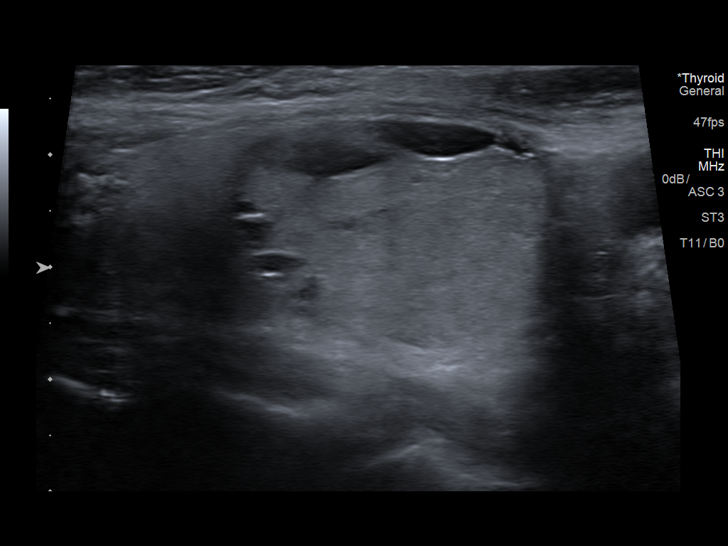
[im 11/43]
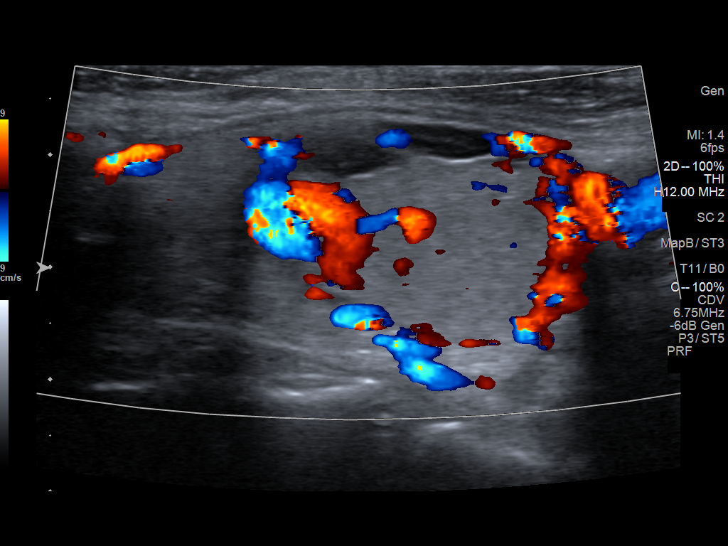
[im 15/43]
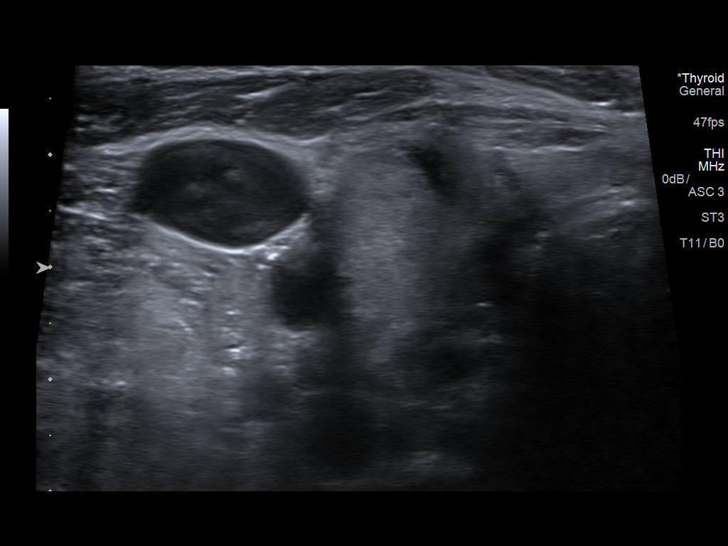
[im 18/43]
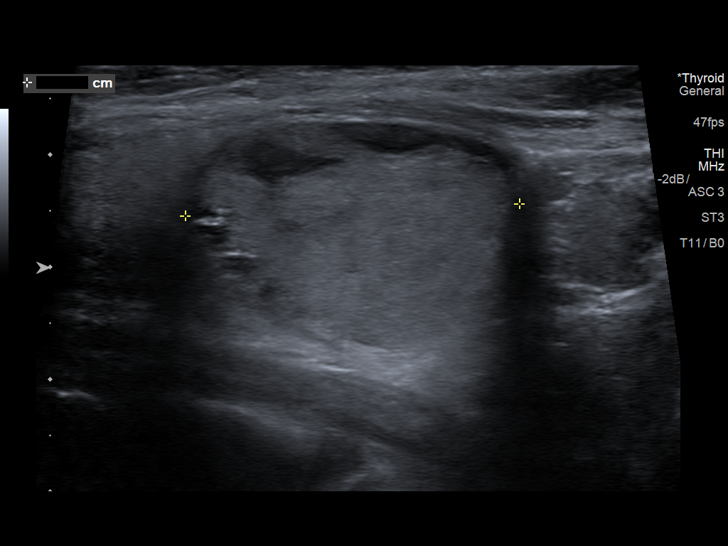
[im 22/43]
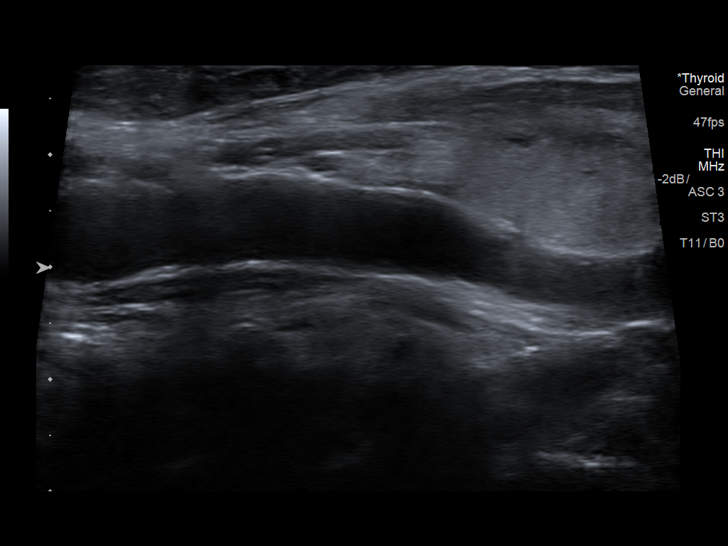
[im 25/43]
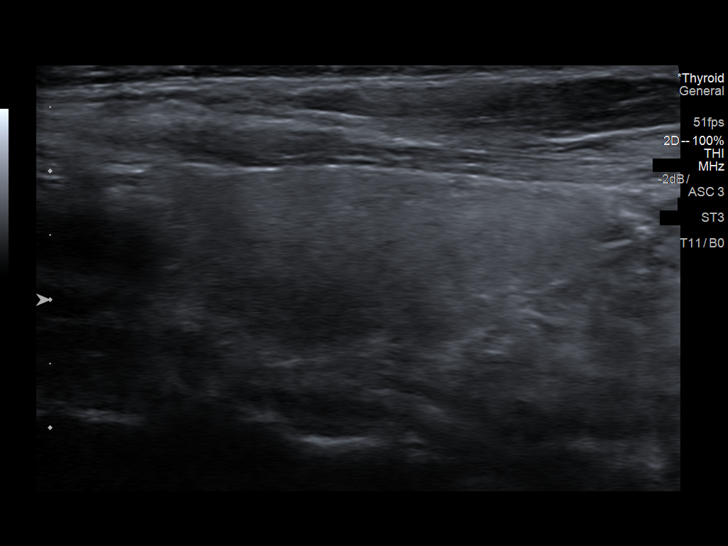
[im 29/43]
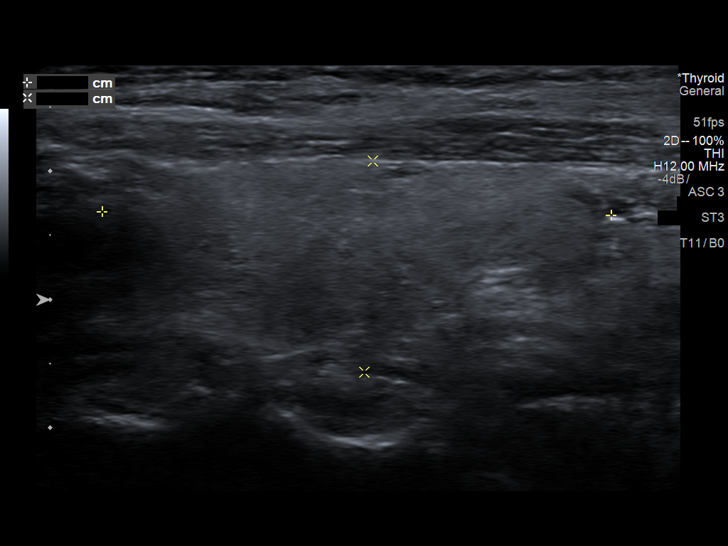
[im 32/43]
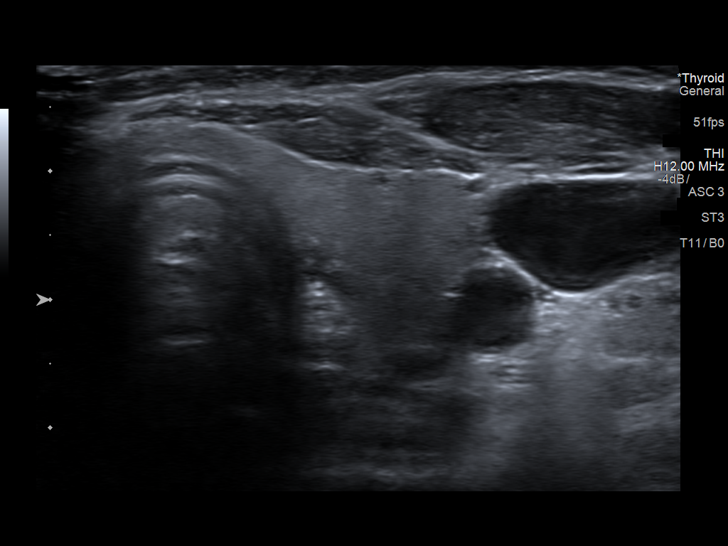
[im 36/43]
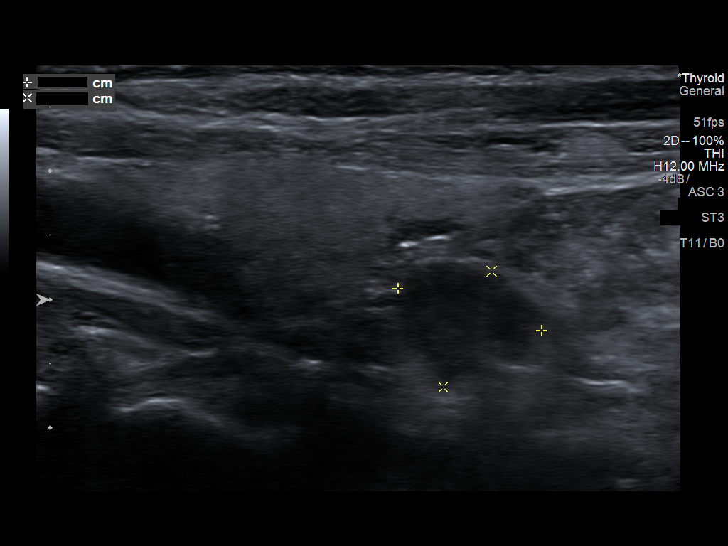
[im 39/43]
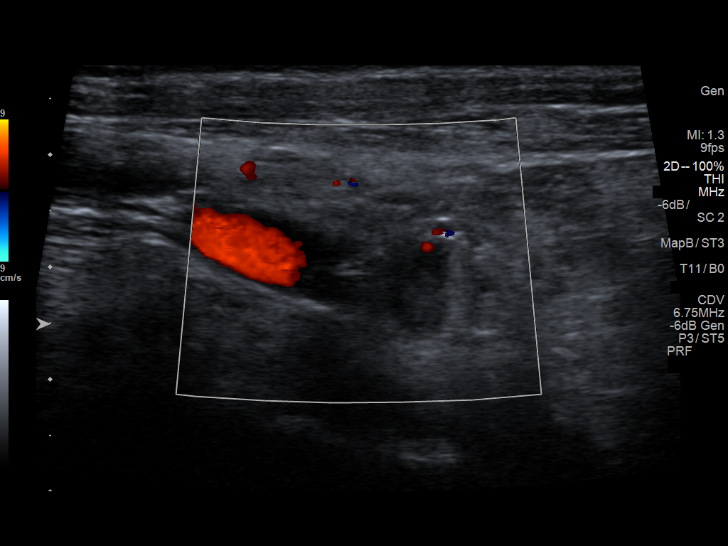
[im 43/43]
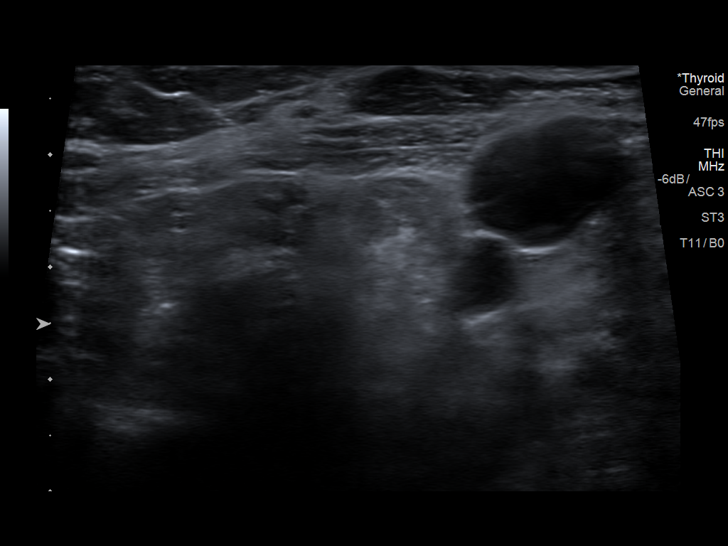

[13 of 25 positions shown; findings below may reference images not displayed]

FINDINGS: Parenchymal Echotexture: Mildly heterogenous

Isthmus: 0.3 cm

Right lobe: 4.9 x 2.4 x 2.6 cm

Left lobe: 4.0 x 1.7 x 1.4 cm

_________________________________________________________

Estimated total number of nodules >/= 1 cm: 1

Number of spongiform nodules >/=  2 cm not described below (TR1): 0

Number of mixed cystic and solid nodules >/= 1.5 cm not described
below (TR2): 0

_________________________________________________________

Nodule # 1:

Location: Right; Mid

Maximum size: 3.0 cm; Other 2 dimensions: 2.2 x 2.0 cm

Composition: solid/almost completely solid (2)

Echogenicity: isoechoic (1)

Shape: not taller-than-wide (0)

Margins: smooth (0)

Echogenic foci: none (0)

ACR TI-RADS total points: 3.

ACR TI-RADS risk category: TR3 (3 points).

ACR TI-RADS recommendations:

**Given size (>/= 2.5 cm) and appearance, fine needle aspiration of
this mildly suspicious nodule should be considered based on TI-RADS
criteria.

_________________________________________________________

There is a hypoechoic mass measuring 1.2 x 1.0 x 0.6 cm inferior to
the lower pole of the left lobe. It is external to the thyroid
gland.
IMPRESSION: Right thyroid nodule 1 meets criteria for fine needle aspiration
biopsy.

Hypoechoic mass inferior to the left lobe of the thyroid gland is
separate from the gland. It is nonspecific. Differential diagnosis
includes parathyroid adenoma.

The above is in keeping with the ACR TI-RADS recommendations - [HOSPITAL] 5791;[DATE].

## 2018-11-22 ENCOUNTER — Other Ambulatory Visit: Payer: Self-pay | Admitting: Surgery

## 2018-11-22 DIAGNOSIS — E210 Primary hyperparathyroidism: Secondary | ICD-10-CM

## 2018-11-30 NOTE — Patient Instructions (Addendum)
It was very nice to see you today, I will be in touch with your labs Assuming all looks good let us plan to visit in 6 months  Please consider having the shingles vaccine done at your drugstore You can also ask them about a tetanus booster.  Remember if you have any wound you need to get a booster for sure!   Your BP was a bit high today- please keep an eye on this, if you start running higher than 140/85 at home please alert me

## 2018-11-30 NOTE — Progress Notes (Addendum)
[facility] [place], [place],  [address] [phone]  Date:  12/02/2018   Name:  Erica Navarro   DOB:  01-25-1951   MRN:  [id number]  PCP:  Darreld Mclean, MD    Chief Complaint: Hypertension (6 month follow up) and Medication Management (stopped taking fish oil and glucosamine-felt achy, back pain)   History of Present Illness:  Erica Navarro is a 68 y.o. very pleasant female patient who presents with the following:  Here today for 46-month follow-up visit History of hypertension, hyperlipidemia, hyperparathyroidism, osteoporosis She is also a patient of Dr. Cruzita Lederer for her endocrinology concerns-last visit with her was in August 2019 I last saw her in August Her holidays were good   Fosamax Amlodipine Aspirin 81 Vitamin D Omega-3- stopped taking  Pravachol- tolerating well   She is due for a lipid panel and A1c today, will also get a bmp as long as we are drawing blood She had just some oatmeal this am  Flu shot- give today  Shingrix- zostavax in 2018 Tetanus- she is not sure   She is seeing [facility] for her hyperparathyroidism She had some sort of bx and did not have cancer, they are doing a follow-up MRI soon   BP Readings from Last 3 Encounters:  12/02/18 (!) 152/90  06/11/18 138/82  05/30/18 138/80   She does monitor her BP at home and notes numbers generally 130s/80s  Patient Active Problem List   Diagnosis Date Noted  . Ventral hernia without obstruction or gangrene 10/30/2016  . Sedentary lifestyle 10/30/2016  . Osteoporosis 06/02/2016  . Vitamin D insufficiency 06/02/2016  . Primary hyperparathyroidism ([place]) 06/04/2015  . HTN (hypertension) 11/11/2012  . High cholesterol 11/11/2012    Past Medical History:  Diagnosis Date  . Hyperlipidemia   . Hypertension     No past surgical history on file.  Social History   Tobacco Use  . Smoking status: Never Smoker  . Smokeless  tobacco: Never Used  Substance Use Topics  . Alcohol use: No  . Drug use: No    No family history on file.  Allergies  Allergen Reactions  . Lisinopril     REACTION: hands and face swell    Medication list has been reviewed and updated.  Current Outpatient Medications on File Prior to Visit  Medication Sig Dispense Refill  . alendronate (FOSAMAX) 70 MG tablet TAKE 1 TABLET BY MOUTH ONCE A WEEK. TAKE WITH A FULL GLASS OF WATER ON AN EMPTY STOMACH 12 tablet 4  . amLODipine (NORVASC) 10 MG tablet Take 1 tablet (10 mg total) by mouth daily. 90 tablet 3  . aspirin 81 MG tablet Take 81 mg by mouth daily.    . Cholecalciferol 1000 UNITS capsule Take 1,000 Units by mouth daily.    . fluticasone (FLONASE) 50 MCG/ACT nasal spray Place 2 sprays into both nostrils daily. (Patient taking differently: Place 2 sprays into both nostrils as needed. ) 16 g 6  . pravastatin (PRAVACHOL) 40 MG tablet Take 1 tablet (40 mg total) by mouth daily. 90 tablet 3  . Glucos-MSM-C-Mn-Ginger-Willow (GLUCOSAMINE MSM COMPLEX) TABS Take 1 tablet by mouth 2 (two) times daily.     No current facility-administered medications on file prior to visit.     Review of Systems:  As per HPI- otherwise negative. No fever or chills No CP or SOB  Physical Examination: Vitals:   12/02/18 1059  BP: Marland Kitchen)  152/90  Pulse: 85  Resp: 16  Temp: 98.4 F (36.9 C)  SpO2: 97%   Vitals:   12/02/18 1059  Weight: 183 lb (83 kg)  Height: 5\' 3"  (1.6 m)   Body mass index is 32.42 kg/m. Ideal Body Weight: Weight in (lb) to have BMI = 25: 140.8  GEN: WDWN, NAD, Non-toxic, A & O x 3, overweight, looks well  HEENT: Atraumatic, Normocephalic. Neck supple. No masses, No LAD.  Bilateral TM wnl, oropharynx normal.  PEERL,EOMI.   Ears and Nose: No external deformity. CV: RRR, No M/G/R. No JVD. No thrill. No extra heart sounds. PULM: CTA B, no wheezes, crackles, rhonchi. No retractions. No resp. distress. No accessory muscle use. ABD:  S, NT, ND, ventral hernia  EXTR: No c/c/e NEURO Normal gait.  PSYCH: Normally interactive. Conversant. Not depressed or anxious appearing.  Calm demeanor.    Assessment and Plan: Essential hypertension - Plan: Basic metabolic panel, amLODipine (NORVASC) 10 MG tablet  Mixed hyperlipidemia - Plan: Lipid panel, pravastatin (PRAVACHOL) 40 MG tablet  Other osteoporosis without current pathological fracture  Elevated glucose level - Plan: Hemoglobin A1c  Hyperparathyroidism, primary ([place]) - Plan: Basic metabolic panel  Immunization due - Plan: Flu vaccine HIGH DOSE PF (Fluzone High dose)  Her BP has been ok here in the past (and at home per her report) on amlodipine 10 mg She will monitor this- if running higher she will alert me  Refilled BP and cholesterol medications Flu shot given Labs pending as above  It was very nice to see you today, I will be in touch with your labs Assuming all looks good let us plan to visit in 6 months  Please consider having the shingles vaccine done at your drugstore You can also ask them about a tetanus booster.  Remember if you have any wound you need to get a booster for sure!   Your BP was a bit high today- please keep an eye on this, if you start running higher than 140/85 at home please alert me   Signed Lamar Blinks, MD  Received her labs, letter to pt Calcium is better than at last visit. Will send to Dr. Cruzita Lederer as well   Results for orders placed or performed in visit on 12/02/18  Hemoglobin A1c  Result Value Ref Range   Hgb A1c MFr Bld 5.5 4.6 - 6.5 %  Lipid panel  Result Value Ref Range   Cholesterol 200 0 - 200 mg/dL   Triglycerides 92.0 0.0 - 149.0 mg/dL   HDL 57.80 >39.00 mg/dL   VLDL 18.4 0.0 - 40.0 mg/dL   LDL Cholesterol 124 (H) 0 - 99 mg/dL   Total CHOL/HDL Ratio 3    NonHDL 381.01   Basic metabolic panel  Result Value Ref Range   Sodium 138 135 - 145 mEq/L   Potassium 4.2 3.5 - 5.1 mEq/L   Chloride 103 96 - 112  mEq/L   CO2 29 19 - 32 mEq/L   Glucose, Bld 83 70 - 99 mg/dL   BUN 11 6 - 23 mg/dL   Creatinine, Ser 0.59 0.40 - 1.20 mg/dL   Calcium 10.9 (H) 8.4 - 10.5 mg/dL   GFR 122.19 >60.00 mL/min

## 2018-12-02 ENCOUNTER — Encounter: Payer: Self-pay | Admitting: Family Medicine

## 2018-12-02 ENCOUNTER — Ambulatory Visit (INDEPENDENT_AMBULATORY_CARE_PROVIDER_SITE_OTHER): Payer: Medicare HMO | Admitting: Family Medicine

## 2018-12-02 VITALS — BP 150/82 | HR 85 | Temp 98.4°F | Resp 16 | Ht 63.0 in | Wt 183.0 lb

## 2018-12-02 DIAGNOSIS — I10 Essential (primary) hypertension: Secondary | ICD-10-CM | POA: Diagnosis not present

## 2018-12-02 DIAGNOSIS — R7309 Other abnormal glucose: Secondary | ICD-10-CM | POA: Diagnosis not present

## 2018-12-02 DIAGNOSIS — E782 Mixed hyperlipidemia: Secondary | ICD-10-CM

## 2018-12-02 DIAGNOSIS — E210 Primary hyperparathyroidism: Secondary | ICD-10-CM | POA: Diagnosis not present

## 2018-12-02 DIAGNOSIS — M818 Other osteoporosis without current pathological fracture: Secondary | ICD-10-CM

## 2018-12-02 DIAGNOSIS — Z23 Encounter for immunization: Secondary | ICD-10-CM

## 2018-12-02 LAB — LIPID PANEL
Cholesterol: 200 mg/dL (ref 0–200)
HDL: 57.8 mg/dL (ref >39.00)
LDL Cholesterol: 124 mg/dL — ABNORMAL HIGH (ref 0–99)
NonHDL: 142.17
Total CHOL/HDL Ratio: 3
Triglycerides: 92 mg/dL (ref 0.0–149.0)
VLDL: 18.4 mg/dL (ref 0.0–40.0)

## 2018-12-02 LAB — BASIC METABOLIC PANEL
BUN: 11 mg/dL (ref 6–23)
CO2: 29 mEq/L (ref 19–32)
Calcium: 10.9 mg/dL — ABNORMAL HIGH (ref 8.4–10.5)
Chloride: 103 mEq/L (ref 96–112)
Creatinine, Ser: 0.59 mg/dL (ref 0.40–1.20)
GFR: 122.19 mL/min (ref >60.00)
Glucose, Bld: 83 mg/dL (ref 70–99)
Potassium: 4.2 mEq/L (ref 3.5–5.1)
Sodium: 138 mEq/L (ref 135–145)

## 2018-12-02 LAB — HEMOGLOBIN A1C: Hgb A1c MFr Bld: 5.5 % (ref 4.6–6.5)

## 2018-12-02 MED ORDER — AMLODIPINE BESYLATE 10 MG PO TABS: 10.0000 mg | ORAL_TABLET | Freq: Every day | ORAL | 4 refills | Status: DC

## 2018-12-02 MED ORDER — PRAVASTATIN SODIUM 40 MG PO TABS: 40.0000 mg | ORAL_TABLET | Freq: Every day | ORAL | 4 refills | Status: DC

## 2018-12-09 ENCOUNTER — Ambulatory Visit
Admission: RE | Admit: 2018-12-09 | Discharge: 2018-12-09 | Disposition: A | Discharge status: Home / Self Care | Payer: Medicare HMO | Source: Ambulatory Visit | Attending: Surgery | Admitting: Surgery

## 2018-12-09 ENCOUNTER — Other Ambulatory Visit: Payer: Medicare HMO

## 2018-12-09 DIAGNOSIS — E041 Nontoxic single thyroid nodule: Secondary | ICD-10-CM | POA: Diagnosis not present

## 2018-12-09 DIAGNOSIS — E210 Primary hyperparathyroidism: Secondary | ICD-10-CM

## 2018-12-09 MED ORDER — IOPAMIDOL (ISOVUE-300) INJECTION 61%
75.0000 mL | Freq: Once | INTRAVENOUS | Status: AC | PRN
  Administered 2018-12-09: 75 mL via INTRAVENOUS

## 2019-01-01 ENCOUNTER — Ambulatory Visit: Payer: Self-pay | Admitting: Surgery

## 2019-01-01 DIAGNOSIS — M810 Age-related osteoporosis without current pathological fracture: Secondary | ICD-10-CM | POA: Diagnosis not present

## 2019-01-01 DIAGNOSIS — E210 Primary hyperparathyroidism: Secondary | ICD-10-CM | POA: Diagnosis not present

## 2019-01-01 DIAGNOSIS — E041 Nontoxic single thyroid nodule: Secondary | ICD-10-CM | POA: Diagnosis not present

## 2019-01-07 ENCOUNTER — Other Ambulatory Visit: Payer: Self-pay

## 2019-01-07 ENCOUNTER — Emergency Department (HOSPITAL_BASED_OUTPATIENT_CLINIC_OR_DEPARTMENT_OTHER)
Admission: EM | Admit: 2019-01-07 | Discharge: 2019-01-07 | Disposition: A | Discharge status: Home / Self Care | Payer: Medicare HMO | Attending: Emergency Medicine | Admitting: Emergency Medicine

## 2019-01-07 ENCOUNTER — Encounter (HOSPITAL_BASED_OUTPATIENT_CLINIC_OR_DEPARTMENT_OTHER): Payer: Self-pay | Admitting: Emergency Medicine

## 2019-01-07 DIAGNOSIS — Z79899 Other long term (current) drug therapy: Secondary | ICD-10-CM | POA: Diagnosis not present

## 2019-01-07 DIAGNOSIS — I10 Essential (primary) hypertension: Secondary | ICD-10-CM | POA: Diagnosis not present

## 2019-01-07 DIAGNOSIS — R531 Weakness: Secondary | ICD-10-CM | POA: Insufficient documentation

## 2019-01-07 DIAGNOSIS — Z7982 Long term (current) use of aspirin: Secondary | ICD-10-CM | POA: Diagnosis not present

## 2019-01-07 LAB — COMPREHENSIVE METABOLIC PANEL
ALT: 15 U/L (ref 0–44)
AST: 21 U/L (ref 15–41)
Albumin: 4.5 g/dL (ref 3.5–5.0)
Alkaline Phosphatase: 79 U/L (ref 38–126)
Anion gap: 9 (ref 5–15)
BUN: 11 mg/dL (ref 8–23)
CO2: 23 mmol/L (ref 22–32)
Calcium: 10.3 mg/dL (ref 8.9–10.3)
Chloride: 101 mmol/L (ref 98–111)
Creatinine, Ser: 0.63 mg/dL (ref 0.44–1.00)
GFR calc Af Amer: >60 mL/min (ref >60)
GFR calc non Af Amer: >60 mL/min (ref >60)
Glucose, Bld: 106 mg/dL — ABNORMAL HIGH (ref 70–99)
Potassium: 3.4 mmol/L — ABNORMAL LOW (ref 3.5–5.1)
Sodium: 133 mmol/L — ABNORMAL LOW (ref 135–145)
Total Bilirubin: 0.9 mg/dL (ref 0.3–1.2)
Total Protein: 8.4 g/dL — ABNORMAL HIGH (ref 6.5–8.1)

## 2019-01-07 LAB — URINALYSIS, ROUTINE W REFLEX MICROSCOPIC
Bilirubin Urine: NEGATIVE
Glucose, UA: NEGATIVE mg/dL
Hgb urine dipstick: NEGATIVE
Ketones, ur: NEGATIVE mg/dL
Nitrite: NEGATIVE
Protein, ur: NEGATIVE mg/dL
Specific Gravity, Urine: <1.005 — ABNORMAL LOW (ref 1.005–1.030)
pH: 6 (ref 5.0–8.0)

## 2019-01-07 LAB — CBC WITH DIFFERENTIAL/PLATELET
Abs Immature Granulocytes: 0.02 10*3/uL (ref 0.00–0.07)
Basophils Absolute: 0.1 10*3/uL (ref 0.0–0.1)
Basophils Relative: 1 %
Eosinophils Absolute: 0.1 10*3/uL (ref 0.0–0.5)
Eosinophils Relative: 2 %
HCT: 42.9 % (ref 36.0–46.0)
Hemoglobin: 13.4 g/dL (ref 12.0–15.0)
Immature Granulocytes: 0 %
Lymphocytes Relative: 17 %
Lymphs Abs: 1.4 10*3/uL (ref 0.7–4.0)
MCH: 27.7 pg (ref 26.0–34.0)
MCHC: 31.2 g/dL (ref 30.0–36.0)
MCV: 88.6 fL (ref 80.0–100.0)
Monocytes Absolute: 0.4 10*3/uL (ref 0.1–1.0)
Monocytes Relative: 5 %
Neutro Abs: 6.1 10*3/uL (ref 1.7–7.7)
Neutrophils Relative %: 75 %
Platelets: 322 10*3/uL (ref 150–400)
RBC: 4.84 MIL/uL (ref 3.87–5.11)
RDW: 14.2 % (ref 11.5–15.5)
WBC: 8.2 10*3/uL (ref 4.0–10.5)
nRBC: 0 % (ref 0.0–0.2)

## 2019-01-07 LAB — URINALYSIS, MICROSCOPIC (REFLEX)

## 2019-01-07 LAB — CBG MONITORING, ED: Glucose-Capillary: 113 mg/dL — ABNORMAL HIGH (ref 70–99)

## 2019-01-07 NOTE — ED Notes (Signed)
Urine culture sent with urine specimen to lab

## 2019-01-07 NOTE — ED Notes (Signed)
ED Provider at bedside. 

## 2019-01-07 NOTE — ED Provider Notes (Signed)
[facility] EMERGENCY DEPARTMENT Provider Note   CSN: [id number] Arrival date & time: 01/07/19  1051    History   Chief Complaint Chief Complaint  Patient presents with  . Weakness    HPI Erica Navarro is a 68 y.o. female.     68 y.o female with a PMH of HTN, HLD, presents to the ED with chief complaint of weakness x this morning. Patient reports she was making breakfast when she began to feel "sick to my stomach", reports making oatmeal and not being able to finish her meal. She endorses intermittent nausea which waxes and wanes throughout this morning. She also endorses feeling lightheaded, she does reports increasing her orange juice intake this past week and reports she had a previous history of acid. She reports feeling "my body weak" throughout all day. She denies any vomiting, fever, headache, abdominal pain, or urinary symptoms at this time. She denies any CAD history or previous history of CVA in her or family.      Past Medical History:  Diagnosis Date  . Hyperlipidemia   . Hypertension     Patient Active Problem List   Diagnosis Date Noted  . Ventral hernia without obstruction or gangrene 10/30/2016  . Sedentary lifestyle 10/30/2016  . Osteoporosis 06/02/2016  . Vitamin D insufficiency 06/02/2016  . Primary hyperparathyroidism ([place]) 06/04/2015  . HTN (hypertension) 11/11/2012  . High cholesterol 11/11/2012    History reviewed. No pertinent surgical history.   OB History   No obstetric history on file.      Home Medications    Prior to Admission medications   Medication Sig Start Date End Date Taking? Authorizing Provider  alendronate (FOSAMAX) 70 MG tablet TAKE 1 TABLET BY MOUTH ONCE A WEEK. TAKE WITH A FULL GLASS OF WATER ON AN EMPTY STOMACH 06/27/18   Copland, Gay Filler, MD  amLODipine (NORVASC) 10 MG tablet Take 1 tablet (10 mg total) by mouth daily. 12/02/18   Copland, Gay Filler, MD  aspirin 81 MG tablet Take 81 mg by mouth daily.     [provider]  Cholecalciferol 1000 UNITS capsule Take 1,000 Units by mouth daily.    [provider]  fluticasone (FLONASE) 50 MCG/ACT nasal spray Place 2 sprays into both nostrils daily. Patient taking differently: Place 2 sprays into both nostrils as needed.  09/14/17   Ann Held, DO  Glucos-MSM-C-Mn-Ginger-Willow (GLUCOSAMINE MSM COMPLEX) TABS Take 1 tablet by mouth 2 (two) times daily.    [provider]  pravastatin (PRAVACHOL) 40 MG tablet Take 1 tablet (40 mg total) by mouth daily. 12/02/18   Copland, Gay Filler, MD    Family History History reviewed. No pertinent family history.  Social History Social History   Tobacco Use  . Smoking status: Never Smoker  . Smokeless tobacco: Never Used  Substance Use Topics  . Alcohol use: No  . Drug use: No     Allergies   Lisinopril   Review of Systems Review of Systems  Constitutional: Negative for chills and fever.  HENT: Negative for ear pain and sore throat.   Eyes: Negative for pain and visual disturbance.  Respiratory: Negative for cough and shortness of breath.   Cardiovascular: Negative for chest pain and palpitations.  Gastrointestinal: Negative for abdominal pain and vomiting.  Genitourinary: Negative for dysuria and hematuria.  Musculoskeletal: Negative for arthralgias and back pain.  Skin: Negative for color change and rash.  Neurological: Positive for weakness and light-headedness. Negative for seizures and syncope.  All other systems reviewed and are negative.    Physical Exam Updated Vital Signs BP (!) 172/91   Pulse 72   Temp 98 F (36.7 C) (Oral)   Resp 20   Ht 5\' 3"  (1.6 m)   Wt 82.1 kg   SpO2 100%   BMI 32.06 kg/m   Physical Exam Vitals signs and nursing note reviewed.  Constitutional:      General: She is not in acute distress.    Appearance: She is well-developed.  HENT:     Head: Normocephalic and atraumatic.     Mouth/Throat:     Pharynx: No  oropharyngeal exudate.  Eyes:     Pupils: Pupils are equal, round, and reactive to light.  Neck:     Musculoskeletal: Normal range of motion.  Cardiovascular:     Rate and Rhythm: Regular rhythm.     Heart sounds: Normal heart sounds.  Pulmonary:     Effort: Pulmonary effort is normal. No respiratory distress.     Breath sounds: Normal breath sounds.  Abdominal:     General: Bowel sounds are normal. There is no distension.     Palpations: Abdomen is soft.     Tenderness: There is no abdominal tenderness.  Musculoskeletal:        General: No tenderness or deformity.     Right lower leg: No edema.     Left lower leg: No edema.  Skin:    General: Skin is warm and dry.  Neurological:     Mental Status: She is alert and oriented to person, place, and time.      ED Treatments / Results  Labs (all labs ordered are listed, but only abnormal results are displayed) Labs Reviewed  COMPREHENSIVE METABOLIC PANEL - Abnormal; Notable for the following components:      Result Value   Sodium 133 (*)    Potassium 3.4 (*)    Glucose, Bld 106 (*)    Total Protein 8.4 (*)    All other components within normal limits  URINALYSIS, ROUTINE W REFLEX MICROSCOPIC - Abnormal; Notable for the following components:   Specific Gravity, Urine <1.005 (*)    Leukocytes,Ua MODERATE (*)    All other components within normal limits  URINALYSIS, MICROSCOPIC (REFLEX) - Abnormal; Notable for the following components:   Bacteria, UA RARE (*)    All other components within normal limits  CBG MONITORING, ED - Abnormal; Notable for the following components:   Glucose-Capillary 113 (*)    All other components within normal limits  URINE CULTURE  CBC WITH DIFFERENTIAL/PLATELET    EKG EKG Interpretation  Date/Time:  Tuesday January 07 2019 11:02:53 EDT Ventricular Rate:  76 PR Interval:    QRS Duration: 94 QT Interval:  389 QTC Calculation: 438 R Axis:   70 Text Interpretation:  Sinus rhythm Probable  left atrial enlargement No significant change was found Confirmed by Jola Schmidt [phone]) on 01/07/2019 11:07:28 AM   Radiology No results found.  Procedures Procedures (including critical care time)  Medications Ordered in ED Medications - No data to display   Initial Impression / Assessment and Plan / ED Course  I have reviewed the triage vital signs and the nursing notes.  Pertinent labs & imaging results that were available during my care of the patient were reviewed by me and considered in my medical decision making (see chart for details).      Patient with a previous history of HTN, HLD presents to the ED with  a chief complaint of weakness since this morning, history is very vague but reports feeling "sick to my stomach". Patient reports eating breakfast but symptoms continued. She reports acid reflux prior to arrival.  CBC was unremarkable, hemoglobin is within normal limits.  CMP showed slight decrease in potassium at 3.4, rest of electrolytes are unremarkable.  UA did have some white blood cells, urine culture was sent.  Patient's vitals are stable, EKG showed no changes consistent with infarct or STEMI.  She reports her symptoms have resolved since arrival to the ED.  Differential diagnoses include vertigo, generalized weakness. CBG was within normal limits. Patient reports feeling much better. Will have her follow up with primary care physician as needed.  Stable vital signs, afebrile prior to arrival.  Patient stable for discharge at this time.   Final Clinical Impressions(s) / ED Diagnoses   Final diagnoses:  Generalized weakness    ED Discharge Orders    None       Janeece Fitting, Hershal Coria 01/07/19 1244    Jola Schmidt, MD 01/08/19 [phone]

## 2019-01-07 NOTE — ED Triage Notes (Addendum)
Reports sudden onset of generalized weakness while eating breakfast at 0800 this morning.  Denies blurred vision, speech difficulties, numbness, tingling.  Ambulatory to room in NAD.  Patient alert and oriented in NAD at present.

## 2019-01-08 LAB — URINE CULTURE: Culture: <10000 — AB

## 2019-01-27 ENCOUNTER — Inpatient Hospital Stay (HOSPITAL_COMMUNITY): Admission: RE | Admit: 2019-01-27 | Payer: Medicare HMO | Source: Ambulatory Visit

## 2019-03-04 ENCOUNTER — Ambulatory Visit: Payer: Self-pay | Admitting: Surgery

## 2019-03-04 NOTE — H&P (Signed)
General Surgery [facility]  Erica Navarro DOB: Nov 30, 1950 Single / Language: English / Race: Black or African American Female   History of Present Illness  The patient is a 68 year old female who presents with primary hyperparathyroidism.  CHIEF COMPLAINT: primary hyperparathyroidism  Patient returns today for follow-up and to discuss the results of her diagnostic testing. Patient has elevated calcium levels ranging up to 11.3 and intact PTH levels ranging up to 82. Patient underwent nuclear medicine parathyroid scan which was unrevealing. She underwent an ultrasound examination of the neck which showed a dominant nodule in the right thyroid lobe. Subsequent fine-needle aspiration biopsy provided limited tissue for evaluation but there was no evidence of malignancy. Patient then underwent a 4D CT scan of the neck on December 09, 2018. This identified a 6 x 9 mm enhancing nodule posterior to the left thyroid lobe which was felt to be a good candidate for parathyroid adenoma. Patient returns today with her family to discuss these results and make plans for surgery.   Problem List/Past Medical OSTEOPOROSIS (M81.0)  PRIMARY HYPERPARATHYROIDISM (E21.0)   Past Surgical History No pertinent past surgical history   Diagnostic Studies History  Colonoscopy  5-10 years ago Mammogram  within last year Pap Smear  1-5 years ago  Allergies Lisinopril *ANTIHYPERTENSIVES*  Swelling. Allergies Reconciled   Medication History  Alendronate Sodium (70MG  Tablet, Oral) Active. amLODIPine Besylate (10MG  Tablet, Oral) Active. Pravastatin Sodium (40MG  Tablet, Oral) Active. Aspirin (81MG  Tablet, Oral) Active. Vitamin D (1000UNIT Tablet, Oral) Active. Flonase (50MCG/ACT Suspension, Nasal) Active. Omega 3 (Oral) Specific strength unknown - Active. Medications Reconciled  Social History Caffeine use  Coffee. No alcohol use  No drug use  Tobacco use   Never smoker.  Family History Hypertension  Brother, Mother, Sister.  Pregnancy / Birth History  Age at menarche  50 years. Age of menopause  <45 Gravida  3 Maternal age  58-20 Para  4  Other Problems Back Pain  Hypercholesterolemia   Vitals Weight: 181.5 lb Height: 68in Body Surface Area: 1.96 m Body Mass Index: 27.6 kg/m  Temp.: 97.4F(Oral)  Pulse: 89 (Regular)  P.OX: 98% (Room air) BP: 138/82 (Sitting, Left Arm, Standard)  Physical Exam  See vital signs recorded above  GENERAL APPEARANCE Development: normal Nutritional status: normal Gross deformities: none  SKIN Rash, lesions, ulcers: none Induration, erythema: none Nodules: none palpable  EYES Conjunctiva and lids: normal Pupils: equal and reactive Iris: normal bilaterally  EARS, NOSE, MOUTH, THROAT External ears: no lesion or deformity External nose: no lesion or deformity Hearing: grossly normal Lips: no lesion or deformity Dentition: normal for age Oral mucosa: moist  NECK Symmetric: yes Trachea: midline Thyroid: no palpable nodules in the thyroid bed  CHEST Respiratory effort: normal Retraction or accessory muscle use: no Breath sounds: normal bilaterally Rales, rhonchi, wheeze: none  CARDIOVASCULAR Auscultation: regular rhythm, normal rate Murmurs: none Pulses: carotid and radial pulse 2+ palpable Lower extremity edema: none Lower extremity varicosities: none  MUSCULOSKELETAL Station and gait: normal Digits and nails: no clubbing or cyanosis Muscle strength: grossly normal all extremities Range of motion: grossly normal all extremities Deformity: none  LYMPHATIC Cervical: none palpable Supraclavicular: none palpable  PSYCHIATRIC Oriented to person, place, and time: yes Mood and affect: normal for situation Judgment and insight: appropriate for situation    Assessment & Plan  PRIMARY HYPERPARATHYROIDISM (E21.0) OSTEOPOROSIS (M81.0) RIGHT THYROID  NODULE (E04.1)  Patient returns today with her family to discuss diagnostic studies and to make plans  for surgery.  Patient has a 4D CT scan which indicates a left posterior parathyroid adenoma. I have recommended proceeding with minimally invasive surgery through a left-sided neck incision for parathyroidectomy. We will plan on a frozen section biopsy during the procedure. We did discuss the possibility of further neck exploration at the time of surgery if this did not turn out to be the parathyroid adenoma for which we were searching. We discussed the risk and benefits of surgery including the potential for recurrent laryngeal nerve injury. We discussed the potential for an overnight hospital stay if necessary. We discussed postoperative recovery and return to work. Patient understands and wishes to proceed with surgery in the near future.  The risks and benefits of the procedure have been discussed at length with the patient. The patient understands the proposed procedure, potential alternative treatments, and the course of recovery to be expected. All of the patient's questions have been answered at this time. The patient wishes to proceed with surgery.  [name], [facility] Office: [phone]

## 2019-03-04 NOTE — H&P (View-Only) (Signed)
General Surgery [facility]  Erica Navarro DOB: Sep 20, 1951 Single / Language: English / Race: Black or African American Female   History of Present Illness  The patient is a 68 year old female who presents with primary hyperparathyroidism.  CHIEF COMPLAINT: primary hyperparathyroidism  Patient returns today for follow-up and to discuss the results of her diagnostic testing. Patient has elevated calcium levels ranging up to 11.3 and intact PTH levels ranging up to 82. Patient underwent nuclear medicine parathyroid scan which was unrevealing. She underwent an ultrasound examination of the neck which showed a dominant nodule in the right thyroid lobe. Subsequent fine-needle aspiration biopsy provided limited tissue for evaluation but there was no evidence of malignancy. Patient then underwent a 4D CT scan of the neck on December 09, 2018. This identified a 6 x 9 mm enhancing nodule posterior to the left thyroid lobe which was felt to be a good candidate for parathyroid adenoma. Patient returns today with her family to discuss these results and make plans for surgery.   Problem List/Past Medical OSTEOPOROSIS (M81.0)  PRIMARY HYPERPARATHYROIDISM (E21.0)   Past Surgical History No pertinent past surgical history   Diagnostic Studies History  Colonoscopy  5-10 years ago Mammogram  within last year Pap Smear  1-5 years ago  Allergies Lisinopril *ANTIHYPERTENSIVES*  Swelling. Allergies Reconciled   Medication History  Alendronate Sodium (70MG  Tablet, Oral) Active. amLODIPine Besylate (10MG  Tablet, Oral) Active. Pravastatin Sodium (40MG  Tablet, Oral) Active. Aspirin (81MG  Tablet, Oral) Active. Vitamin D (1000UNIT Tablet, Oral) Active. Flonase (50MCG/ACT Suspension, Nasal) Active. Omega 3 (Oral) Specific strength unknown - Active. Medications Reconciled  Social History Caffeine use  Coffee. No alcohol use  No drug use  Tobacco use   Never smoker.  Family History Hypertension  Brother, Mother, Sister.  Pregnancy / Birth History  Age at menarche  25 years. Age of menopause  <45 Gravida  3 Maternal age  100-20 Para  4  Other Problems Back Pain  Hypercholesterolemia   Vitals Weight: 181.5 lb Height: 68in Body Surface Area: 1.96 m Body Mass Index: 27.6 kg/m  Temp.: 97.61F(Oral)  Pulse: 89 (Regular)  P.OX: 98% (Room air) BP: 138/82 (Sitting, Left Arm, Standard)  Physical Exam  See vital signs recorded above  GENERAL APPEARANCE Development: normal Nutritional status: normal Gross deformities: none  SKIN Rash, lesions, ulcers: none Induration, erythema: none Nodules: none palpable  EYES Conjunctiva and lids: normal Pupils: equal and reactive Iris: normal bilaterally  EARS, NOSE, MOUTH, THROAT External ears: no lesion or deformity External nose: no lesion or deformity Hearing: grossly normal Lips: no lesion or deformity Dentition: normal for age Oral mucosa: moist  NECK Symmetric: yes Trachea: midline Thyroid: no palpable nodules in the thyroid bed  CHEST Respiratory effort: normal Retraction or accessory muscle use: no Breath sounds: normal bilaterally Rales, rhonchi, wheeze: none  CARDIOVASCULAR Auscultation: regular rhythm, normal rate Murmurs: none Pulses: carotid and radial pulse 2+ palpable Lower extremity edema: none Lower extremity varicosities: none  MUSCULOSKELETAL Station and gait: normal Digits and nails: no clubbing or cyanosis Muscle strength: grossly normal all extremities Range of motion: grossly normal all extremities Deformity: none  LYMPHATIC Cervical: none palpable Supraclavicular: none palpable  PSYCHIATRIC Oriented to person, place, and time: yes Mood and affect: normal for situation Judgment and insight: appropriate for situation    Assessment & Plan  PRIMARY HYPERPARATHYROIDISM (E21.0) OSTEOPOROSIS (M81.0) RIGHT THYROID  NODULE (E04.1)  Patient returns today with her family to discuss diagnostic studies and to make plans  for surgery.  Patient has a 4D CT scan which indicates a left posterior parathyroid adenoma. I have recommended proceeding with minimally invasive surgery through a left-sided neck incision for parathyroidectomy. We will plan on a frozen section biopsy during the procedure. We did discuss the possibility of further neck exploration at the time of surgery if this did not turn out to be the parathyroid adenoma for which we were searching. We discussed the risk and benefits of surgery including the potential for recurrent laryngeal nerve injury. We discussed the potential for an overnight hospital stay if necessary. We discussed postoperative recovery and return to work. Patient understands and wishes to proceed with surgery in the near future.  The risks and benefits of the procedure have been discussed at length with the patient. The patient understands the proposed procedure, potential alternative treatments, and the course of recovery to be expected. All of the patient's questions have been answered at this time. The patient wishes to proceed with surgery.  [name], [facility] Office: [phone]

## 2019-03-20 NOTE — Progress Notes (Signed)
01-07-19 ( Epic) EKG

## 2019-03-20 NOTE — Patient Instructions (Addendum)
Erica Navarro  03/20/2019   Your procedure is scheduled on: 03-28-19    Report to [facility] Main  Entrance    Report to Admitting at 8:00 AM   YOU NEED TO HAVE A COVID 19 TEST ON 03/25/19 2:35 PM, THIS TEST MUST BE DONE BEFORE SURGERY, COME TO [facility] ENTRANCE BETWEEN THE HOURS OF 900 AM AND 300 PM ON YOUR COVID TEST DATE. AFTER YOUR COVID 19 TEST, FOLLOW YOUR QUARANTINE INSTRUCTIONS AS PROVIDED IN YOUR HANDOUT.   Call this number if you have problems the morning of surgery [phone]    Remember: Do not eat food or drink liquids :After Midnight.    BRUSH YOUR TEETH MORNING OF SURGERY AND RINSE YOUR MOUTH OUT, NO CHEWING GUM CANDY OR MINTS.     Take these medicines the morning of surgery with A SIP OF WATER: Amlodipine (Norvasc)                                You may not have any metal on your body including hair pins and              piercings     Do not wear jewelry, make-up, lotions, powders or perfumes, deodorant              Do not wear nail polish.  Do not shave  48 hours prior to surgery.                Do not bring valuables to the hospital. [place].  Contacts, dentures or bridgework may not be worn into surgery.  .     Patients discharged the day of surgery will not be allowed to drive home. IF YOU ARE HAVING SURGERY AND GOING HOME THE SAME DAY, YOU MUST HAVE AN ADULT TO DRIVE YOU HOME AND BE WITH YOU FOR 24 HOURS. YOU MAY GO HOME BY TAXI OR UBER OR ORTHERWISE, BUT AN ADULT MUST ACCOMPANY YOU HOME AND STAY WITH YOU FOR 24 HOURS.    Name and phone number of your driver: MONICA GAUER [phone]               Please read over the following fact sheets you were given: _____________________________________________________________________             [facility] - Preparing for Surgery Before surgery, you can play an important role.  Because skin is not  sterile, your skin needs to be as free of germs as possible.  You can reduce the number of germs on your skin by washing with CHG (chlorahexidine gluconate) soap before surgery.  CHG is an antiseptic cleaner which kills germs and bonds with the skin to continue killing germs even after washing. Please DO NOT use if you have an allergy to CHG or antibacterial soaps.  If your skin becomes reddened/irritated stop using the CHG and inform your nurse when you arrive at Short Stay. Do not shave (including legs and underarms) for at least 48 hours prior to the first CHG shower.  You may shave your face/neck. Please follow these instructions carefully:  1.  Shower with CHG Soap the night before surgery and the  morning of Surgery.  2.  If you  choose to wash your hair, wash your hair first as usual with your  normal  shampoo.  3.  After you shampoo, rinse your hair and body thoroughly to remove the  shampoo.                           4.  Use CHG as you would any other liquid soap.  You can apply chg directly  to the skin and wash                       Gently with a scrungie or clean washcloth.  5.  Apply the CHG Soap to your body ONLY FROM THE NECK DOWN.   Do not use on face/ open                           Wound or open sores. Avoid contact with eyes, ears mouth and genitals (private parts).                       Wash face,  Genitals (private parts) with your normal soap.             6.  Wash thoroughly, paying special attention to the area where your surgery  will be performed.  7.  Thoroughly rinse your body with warm water from the neck down.  8.  DO NOT shower/wash with your normal soap after using and rinsing off  the CHG Soap.                9.  Pat yourself dry with a clean towel.            10.  Wear clean pajamas.            11.  Place clean sheets on your bed the night of your first shower and do not  sleep with pets. Day of Surgery : Do not apply any lotions/deodorants the morning of surgery.   Please wear clean clothes to the hospital/surgery center.  FAILURE TO FOLLOW THESE INSTRUCTIONS MAY RESULT IN THE CANCELLATION OF YOUR SURGERY PATIENT SIGNATURE_________________________________  NURSE SIGNATURE__________________________________  ________________________________________________________________________

## 2019-03-20 NOTE — Progress Notes (Signed)
SPOKE W/Pt _     SCREENING SYMPTOMS OF COVID 19:   COUGH--No  RUNNY NOSE--- No  SORE THROAT---No  NASAL CONGESTION----No  SNEEZING----No  SHORTNESS OF BREATH---No  DIFFICULTY BREATHING---No  TEMP >100.0 -----No  UNEXPLAINED BODY ACHES------No  CHILLS --------No   HEADACHES ---------No  LOSS OF SMELL/ TASTE --------No    HAVE YOU OR ANY FAMILY MEMBER TRAVELLED PAST 14 DAYS OUT OF THE   COUNTY---No STATE----No COUNTRY---No-  HAVE YOU OR ANY FAMILY MEMBER BEEN EXPOSED TO ANYONE WITH COVID 19? No    

## 2019-03-21 ENCOUNTER — Other Ambulatory Visit: Payer: Self-pay

## 2019-03-21 ENCOUNTER — Encounter (HOSPITAL_COMMUNITY): Payer: Self-pay

## 2019-03-21 ENCOUNTER — Encounter (HOSPITAL_COMMUNITY)
Admission: RE | Admit: 2019-03-21 | Discharge: 2019-03-21 | Disposition: A | Discharge status: Home / Self Care | Payer: Medicare HMO | Source: Ambulatory Visit | Attending: Surgery | Admitting: Surgery

## 2019-03-21 DIAGNOSIS — E210 Primary hyperparathyroidism: Secondary | ICD-10-CM | POA: Insufficient documentation

## 2019-03-21 DIAGNOSIS — Z01812 Encounter for preprocedural laboratory examination: Secondary | ICD-10-CM | POA: Diagnosis present

## 2019-03-21 HISTORY — DX: Thyrotoxicosis, unspecified without thyrotoxic crisis or storm: E05.90

## 2019-03-21 LAB — BASIC METABOLIC PANEL
Anion gap: 8 (ref 5–15)
BUN: 13 mg/dL (ref 8–23)
CO2: 24 mmol/L (ref 22–32)
Calcium: 10.5 mg/dL — ABNORMAL HIGH (ref 8.9–10.3)
Chloride: 108 mmol/L (ref 98–111)
Creatinine, Ser: 0.66 mg/dL (ref 0.44–1.00)
GFR calc Af Amer: >60 mL/min (ref >60)
GFR calc non Af Amer: >60 mL/min (ref >60)
Glucose, Bld: 103 mg/dL — ABNORMAL HIGH (ref 70–99)
Potassium: 3.9 mmol/L (ref 3.5–5.1)
Sodium: 140 mmol/L (ref 135–145)

## 2019-03-21 LAB — CBC
HCT: 40.9 % (ref 36.0–46.0)
Hemoglobin: 12.6 g/dL (ref 12.0–15.0)
MCH: 27.6 pg (ref 26.0–34.0)
MCHC: 30.8 g/dL (ref 30.0–36.0)
MCV: 89.7 fL (ref 80.0–100.0)
Platelets: 323 10*3/uL (ref 150–400)
RBC: 4.56 MIL/uL (ref 3.87–5.11)
RDW: 14.3 % (ref 11.5–15.5)
WBC: 5.8 10*3/uL (ref 4.0–10.5)
nRBC: 0 % (ref 0.0–0.2)

## 2019-03-24 ENCOUNTER — Other Ambulatory Visit (HOSPITAL_COMMUNITY): Payer: Medicare HMO

## 2019-03-25 ENCOUNTER — Other Ambulatory Visit (HOSPITAL_COMMUNITY)
Admission: RE | Admit: 2019-03-25 | Discharge: 2019-03-25 | Disposition: A | Discharge status: Home / Self Care | Payer: Medicare HMO | Source: Ambulatory Visit | Attending: Surgery | Admitting: Surgery

## 2019-03-25 ENCOUNTER — Other Ambulatory Visit: Payer: Self-pay

## 2019-03-25 DIAGNOSIS — Z1159 Encounter for screening for other viral diseases: Secondary | ICD-10-CM | POA: Insufficient documentation

## 2019-03-26 LAB — NOVEL CORONAVIRUS, NAA (HOSP ORDER, SEND-OUT TO REF LAB; TAT 18-24 HRS): SARS-CoV-2, NAA: NOT DETECTED

## 2019-03-27 NOTE — Progress Notes (Signed)
SPOKE W/  _patient     SCREENING SYMPTOMS OF COVID 19: denies all   COUGH--no  RUNNY NOSE--- no  SORE THROAT---no  NASAL CONGESTION----no  SNEEZING----no  SHORTNESS OF BREATH---no  DIFFICULTY BREATHING---no  TEMP >100.0 -----no  UNEXPLAINED BODY ACHES-----no-  CHILLS -----no---   HEADACHES --no-------  LOSS OF SMELL/ TASTE ------no    HAVE YOU OR ANY FAMILY MEMBER TRAVELLED PAST 14 DAYS OUT OF THE   COUNTY---no STATE----no COUNTRY---no-  HAVE YOU OR ANY FAMILY MEMBER BEEN EXPOSED TO ANYONE WITH COVID 19? no

## 2019-03-28 ENCOUNTER — Ambulatory Visit (HOSPITAL_COMMUNITY)
Admission: RE | Admit: 2019-03-28 | Discharge: 2019-03-28 | Disposition: A | Discharge status: Home / Self Care | Payer: Medicare HMO | Attending: Surgery | Admitting: Surgery

## 2019-03-28 ENCOUNTER — Encounter (HOSPITAL_COMMUNITY)
Admission: RE | Disposition: A | Discharge status: Home / Self Care | Payer: Self-pay | Source: Home / Self Care | Attending: Surgery

## 2019-03-28 ENCOUNTER — Ambulatory Visit (HOSPITAL_COMMUNITY): Payer: Medicare HMO | Admitting: Certified Registered Nurse Anesthetist

## 2019-03-28 ENCOUNTER — Other Ambulatory Visit: Payer: Self-pay

## 2019-03-28 ENCOUNTER — Encounter (HOSPITAL_COMMUNITY): Payer: Self-pay | Admitting: Certified Registered Nurse Anesthetist

## 2019-03-28 ENCOUNTER — Ambulatory Visit (HOSPITAL_COMMUNITY): Payer: Medicare HMO | Admitting: Physician Assistant

## 2019-03-28 DIAGNOSIS — Z7951 Long term (current) use of inhaled steroids: Secondary | ICD-10-CM | POA: Diagnosis not present

## 2019-03-28 DIAGNOSIS — D351 Benign neoplasm of parathyroid gland: Secondary | ICD-10-CM | POA: Insufficient documentation

## 2019-03-28 DIAGNOSIS — E210 Primary hyperparathyroidism: Secondary | ICD-10-CM | POA: Diagnosis not present

## 2019-03-28 DIAGNOSIS — Z6827 Body mass index (BMI) 27.0-27.9, adult: Secondary | ICD-10-CM | POA: Insufficient documentation

## 2019-03-28 DIAGNOSIS — E7800 Pure hypercholesterolemia, unspecified: Secondary | ICD-10-CM | POA: Insufficient documentation

## 2019-03-28 DIAGNOSIS — Z8249 Family history of ischemic heart disease and other diseases of the circulatory system: Secondary | ICD-10-CM | POA: Insufficient documentation

## 2019-03-28 DIAGNOSIS — E669 Obesity, unspecified: Secondary | ICD-10-CM | POA: Diagnosis not present

## 2019-03-28 DIAGNOSIS — M810 Age-related osteoporosis without current pathological fracture: Secondary | ICD-10-CM | POA: Insufficient documentation

## 2019-03-28 DIAGNOSIS — Z888 Allergy status to other drugs, medicaments and biological substances status: Secondary | ICD-10-CM | POA: Insufficient documentation

## 2019-03-28 DIAGNOSIS — Z7982 Long term (current) use of aspirin: Secondary | ICD-10-CM | POA: Insufficient documentation

## 2019-03-28 DIAGNOSIS — E213 Hyperparathyroidism, unspecified: Secondary | ICD-10-CM | POA: Diagnosis not present

## 2019-03-28 DIAGNOSIS — K439 Ventral hernia without obstruction or gangrene: Secondary | ICD-10-CM | POA: Diagnosis not present

## 2019-03-28 DIAGNOSIS — I10 Essential (primary) hypertension: Secondary | ICD-10-CM | POA: Diagnosis not present

## 2019-03-28 DIAGNOSIS — Z79899 Other long term (current) drug therapy: Secondary | ICD-10-CM | POA: Insufficient documentation

## 2019-03-28 HISTORY — PX: PARATHYROIDECTOMY: SHX19

## 2019-03-28 SURGERY — PARATHYROIDECTOMY
Anesthesia: General | Site: Neck | Laterality: Left

## 2019-03-28 MED ORDER — 0.9 % SODIUM CHLORIDE (POUR BTL) OPTIME
TOPICAL | Status: DC | PRN
  Administered 2019-03-28: 1000 mL

## 2019-03-28 MED ORDER — LIDOCAINE 2% (20 MG/ML) 5 ML SYRINGE
INTRAMUSCULAR | Status: DC | PRN
  Administered 2019-03-28: 100 mg via INTRAVENOUS

## 2019-03-28 MED ORDER — SUGAMMADEX SODIUM 200 MG/2ML IV SOLN
INTRAVENOUS | Status: AC
  Filled 2019-03-28: qty 2

## 2019-03-28 MED ORDER — CHLORHEXIDINE GLUCONATE CLOTH 2 % EX PADS: 6.0000 | MEDICATED_PAD | Freq: Once | CUTANEOUS | Status: DC

## 2019-03-28 MED ORDER — DEXAMETHASONE SODIUM PHOSPHATE 4 MG/ML IJ SOLN
INTRAMUSCULAR | Status: DC | PRN
  Administered 2019-03-28: 10 mg via INTRAVENOUS

## 2019-03-28 MED ORDER — PHENYLEPHRINE 40 MCG/ML (10ML) SYRINGE FOR IV PUSH (FOR BLOOD PRESSURE SUPPORT)
PREFILLED_SYRINGE | INTRAVENOUS | Status: AC
  Filled 2019-03-28: qty 10

## 2019-03-28 MED ORDER — ONDANSETRON HCL 4 MG/2ML IJ SOLN
INTRAMUSCULAR | Status: AC
  Filled 2019-03-28: qty 2

## 2019-03-28 MED ORDER — DEXAMETHASONE SODIUM PHOSPHATE 10 MG/ML IJ SOLN
INTRAMUSCULAR | Status: AC
  Filled 2019-03-28: qty 1

## 2019-03-28 MED ORDER — PROPOFOL 10 MG/ML IV BOLUS
INTRAVENOUS | Status: DC | PRN
  Administered 2019-03-28: 150 mg via INTRAVENOUS

## 2019-03-28 MED ORDER — BUPIVACAINE-EPINEPHRINE (PF) 0.25% -1:200000 IJ SOLN
INTRAMUSCULAR | Status: DC | PRN
  Administered 2019-03-28: 8 mL

## 2019-03-28 MED ORDER — KETOROLAC TROMETHAMINE 30 MG/ML IJ SOLN: 30.0000 mg | Freq: Once | INTRAMUSCULAR | Status: DC | PRN

## 2019-03-28 MED ORDER — SUGAMMADEX SODIUM 200 MG/2ML IV SOLN
INTRAVENOUS | Status: DC | PRN
  Administered 2019-03-28: 200 mg via INTRAVENOUS

## 2019-03-28 MED ORDER — MIDAZOLAM HCL 2 MG/2ML IJ SOLN
INTRAMUSCULAR | Status: AC
  Filled 2019-03-28: qty 2

## 2019-03-28 MED ORDER — CEFAZOLIN SODIUM-DEXTROSE 2-4 GM/100ML-% IV SOLN
2.0000 g | INTRAVENOUS | Status: AC
  Administered 2019-03-28: 2 g via INTRAVENOUS
  Filled 2019-03-28: qty 100

## 2019-03-28 MED ORDER — EPHEDRINE 5 MG/ML INJ
INTRAVENOUS | Status: AC
  Filled 2019-03-28: qty 10

## 2019-03-28 MED ORDER — HYDROMORPHONE HCL 1 MG/ML IJ SOLN
0.2500 mg | INTRAMUSCULAR | Status: DC | PRN
  Administered 2019-03-28: 0.5 mg via INTRAVENOUS

## 2019-03-28 MED ORDER — FENTANYL CITRATE (PF) 100 MCG/2ML IJ SOLN
INTRAMUSCULAR | Status: AC
  Filled 2019-03-28: qty 2

## 2019-03-28 MED ORDER — ONDANSETRON HCL 4 MG/2ML IJ SOLN
INTRAMUSCULAR | Status: DC | PRN
  Administered 2019-03-28: 4 mg via INTRAVENOUS

## 2019-03-28 MED ORDER — ROCURONIUM BROMIDE 10 MG/ML (PF) SYRINGE
PREFILLED_SYRINGE | INTRAVENOUS | Status: DC | PRN
  Administered 2019-03-28: 50 mg via INTRAVENOUS

## 2019-03-28 MED ORDER — LACTATED RINGERS IV SOLN
INTRAVENOUS | Status: DC
  Administered 2019-03-28: 09:00:00 via INTRAVENOUS

## 2019-03-28 MED ORDER — BUPIVACAINE-EPINEPHRINE (PF) 0.25% -1:200000 IJ SOLN
INTRAMUSCULAR | Status: AC
  Filled 2019-03-28: qty 30

## 2019-03-28 MED ORDER — HYDROMORPHONE HCL 1 MG/ML IJ SOLN
INTRAMUSCULAR | Status: AC
  Filled 2019-03-28: qty 1

## 2019-03-28 MED ORDER — EPHEDRINE SULFATE-NACL 50-0.9 MG/10ML-% IV SOSY
PREFILLED_SYRINGE | INTRAVENOUS | Status: DC | PRN
  Administered 2019-03-28: 5 mg via INTRAVENOUS

## 2019-03-28 MED ORDER — TRAMADOL HCL 50 MG PO TABS: 50.0000 mg | ORAL_TABLET | Freq: Four times a day (QID) | ORAL | 0 refills | Status: DC | PRN

## 2019-03-28 MED ORDER — MEPERIDINE HCL 50 MG/ML IJ SOLN: 6.2500 mg | INTRAMUSCULAR | Status: DC | PRN

## 2019-03-28 MED ORDER — PHENYLEPHRINE 40 MCG/ML (10ML) SYRINGE FOR IV PUSH (FOR BLOOD PRESSURE SUPPORT)
PREFILLED_SYRINGE | INTRAVENOUS | Status: DC | PRN
  Administered 2019-03-28: 80 ug via INTRAVENOUS

## 2019-03-28 MED ORDER — LIDOCAINE 2% (20 MG/ML) 5 ML SYRINGE
INTRAMUSCULAR | Status: AC
  Filled 2019-03-28: qty 5

## 2019-03-28 MED ORDER — PROMETHAZINE HCL 25 MG/ML IJ SOLN: 6.2500 mg | INTRAMUSCULAR | Status: DC | PRN

## 2019-03-28 MED ORDER — FENTANYL CITRATE (PF) 100 MCG/2ML IJ SOLN
INTRAMUSCULAR | Status: DC | PRN
  Administered 2019-03-28: 100 ug via INTRAVENOUS
  Administered 2019-03-28: 50 ug
  Administered 2019-03-28 (×2): 50 ug via INTRAVENOUS

## 2019-03-28 MED ORDER — MIDAZOLAM HCL 5 MG/5ML IJ SOLN
INTRAMUSCULAR | Status: DC | PRN
  Administered 2019-03-28: 2 mg via INTRAVENOUS

## 2019-03-28 SURGICAL SUPPLY — 32 items
ADH SKN CLS APL DERMABOND .7 (GAUZE/BANDAGES/DRESSINGS) ×1
APL PRP STRL LF DISP 70% ISPRP (MISCELLANEOUS) ×1
ATTRACTOMAT 16X20 MAGNETIC DRP (DRAPES) ×2 IMPLANT
BLADE SURG 15 STRL LF DISP TIS (BLADE) ×1 IMPLANT
BLADE SURG 15 STRL SS (BLADE) ×2
CHLORAPREP W/TINT 26 (MISCELLANEOUS) ×2 IMPLANT
CLIP VESOCCLUDE MED 6/CT (CLIP) ×4 IMPLANT
CLIP VESOCCLUDE SM WIDE 6/CT (CLIP) ×4 IMPLANT
COVER SURGICAL LIGHT HANDLE (MISCELLANEOUS) ×2 IMPLANT
COVER WAND RF STERILE (DRAPES) ×2 IMPLANT
DERMABOND ADVANCED (GAUZE/BANDAGES/DRESSINGS) ×1
DERMABOND ADVANCED .7 DNX12 (GAUZE/BANDAGES/DRESSINGS) IMPLANT
DRAPE LAPAROTOMY T 98X78 PEDS (DRAPES) ×2 IMPLANT
ELECT PENCIL ROCKER SW 15FT (MISCELLANEOUS) ×2 IMPLANT
ELECT REM PT RETURN 15FT ADLT (MISCELLANEOUS) ×2 IMPLANT
GAUZE 4X4 16PLY RFD (DISPOSABLE) ×2 IMPLANT
GLOVE SURG ORTHO 8.0 STRL STRW (GLOVE) ×2 IMPLANT
GOWN STRL REUS W/TWL XL LVL3 (GOWN DISPOSABLE) ×6 IMPLANT
HEMOSTAT SURGICEL 2X4 FIBR (HEMOSTASIS) IMPLANT
ILLUMINATOR WAVEGUIDE N/F (MISCELLANEOUS) IMPLANT
KIT BASIN OR (CUSTOM PROCEDURE TRAY) ×2 IMPLANT
KIT TURNOVER KIT A (KITS) IMPLANT
NDL HYPO 25X1 1.5 SAFETY (NEEDLE) ×1 IMPLANT
NEEDLE HYPO 25X1 1.5 SAFETY (NEEDLE) ×2 IMPLANT
PACK BASIC VI WITH GOWN DISP (CUSTOM PROCEDURE TRAY) ×2 IMPLANT
SUT MNCRL AB 4-0 PS2 18 (SUTURE) ×2 IMPLANT
SUT VIC AB 3-0 SH 18 (SUTURE) ×2 IMPLANT
SYR BULB IRRIGATION 50ML (SYRINGE) ×2 IMPLANT
SYR CONTROL 10ML LL (SYRINGE) ×2 IMPLANT
TOWEL OR 17X26 10 PK STRL BLUE (TOWEL DISPOSABLE) ×2 IMPLANT
TOWEL OR NON WOVEN STRL DISP B (DISPOSABLE) ×2 IMPLANT
TUBING CONNECTING 10 (TUBING) ×2 IMPLANT

## 2019-03-28 NOTE — Op Note (Signed)
OPERATIVE REPORT - PARATHYROIDECTOMY  Preoperative diagnosis: Primary hyperparathyroidism  Postop diagnosis: Same  Procedure: Left minimally invasive parathyroidectomy  Surgeon:  [name], MD  Anesthesia: General endotracheal  Estimated blood loss: Minimal  Preparation: ChloraPrep  Indications: Patient returns today for follow-up and to discuss the results of her diagnostic testing. Patient has elevated calcium levels ranging up to 11.3 and intact PTH levels ranging up to 82. Patient underwent nuclear medicine parathyroid scan which was unrevealing. She underwent an ultrasound examination of the neck which showed a dominant nodule in the right thyroid lobe. Subsequent fine-needle aspiration biopsy provided limited tissue for evaluation but there was no evidence of malignancy. Patient then underwent a 4D CT scan of the neck on December 09, 2018. This identified a 6 x 9 mm enhancing nodule posterior to the left thyroid lobe which was felt to be a good candidate for parathyroid adenoma.   Procedure: The patient was prepared in the pre-operative holding area. The patient was brought to the operating room and placed in a supine position on the operating room table. Following administration of general anesthesia, the patient was positioned and then prepped and draped in the usual strict aseptic fashion. After ascertaining that an adequate level of anesthesia been achieved, a neck incision was made with a #15 blade. Dissection was carried through subcutaneous tissues and platysma. Hemostasis was obtained with the electrocautery. Skin flaps were developed circumferentially and a Weitlander retractor was placed for exposure.  Strap muscles were incised in the midline. Strap muscles were reflected lateralley exposing the thyroid lobe. With gentle blunt dissection the thyroid lobe was mobilized.  Dissection was carried through adipose tissue and an enlarged parathyroid gland was identified. It was  gently mobilized. Vascular structures were divided between small ligaclips. Care was taken to avoid the recurrent laryngeal nerve and the esophagus. The parathyroid gland was completely excised. It was submitted to pathology where frozen section confirmed parathyroid tissue consistent with adenoma.  Neck was irrigated with warm saline and good hemostasis was noted. Fibrillar was placed in the operative field. Strap muscles were approximated in the midline with interrupted 3-0 Vicryl sutures. Platysma was closed with interrupted 3-0 Vicryl sutures. Marcaine was infiltrated circumferentially. Skin was closed with a running 4-0 Monocryl subcuticular suture. Wound was washed and dried and Dermabond was applied. Patient was awakened from anesthesia and brought to the recovery room. The patient tolerated the procedure well.   [name], MD [facility] Office: [phone]

## 2019-03-28 NOTE — Anesthesia Preprocedure Evaluation (Addendum)
Anesthesia Evaluation  Patient identified by MRN, date of birth, ID band Patient awake    Reviewed: Allergy & Precautions, NPO status , Patient's Chart, lab work & pertinent test results  Airway Mallampati: I       Dental no notable dental hx. (+) Teeth Intact   Pulmonary neg pulmonary ROS,    Pulmonary exam normal breath sounds clear to auscultation       Cardiovascular hypertension, Pt. on medications  Rhythm:Regular Rate:Normal     Neuro/Psych negative neurological ROS     GI/Hepatic negative GI ROS, Neg liver ROS,   Endo/Other  Hyperthyroidism   Renal/GU negative Renal ROS  negative genitourinary   Musculoskeletal   Abdominal (+) + obese,   Peds  Hematology negative hematology ROS (+)   Anesthesia Other Findings   Reproductive/Obstetrics                            Anesthesia Physical Anesthesia Plan  ASA: II  Anesthesia Plan: General   Post-op Pain Management:    Induction: Intravenous  PONV Risk Score and Plan: 4 or greater and Ondansetron and Dexamethasone  Airway Management Planned: Oral ETT  Additional Equipment:   Intra-op Plan:   Post-operative Plan: Extubation in OR  Informed Consent: I have reviewed the patients History and Physical, chart, labs and discussed the procedure including the risks, benefits and alternatives for the proposed anesthesia with the patient or authorized representative who has indicated his/her understanding and acceptance.     Dental advisory given  Plan Discussed with: CRNA and Surgeon  Anesthesia Plan Comments:        Anesthesia Quick Evaluation

## 2019-03-28 NOTE — Interval H&P Note (Signed)
History and Physical Interval Note:  03/28/2019 10:04 AM  Erica Navarro  has presented today for surgery, with the diagnosis of PRIMARY HYPERPARATHYROIDISM.  The various methods of treatment have been discussed with the patient and family. After consideration of risks, benefits and other options for treatment, the patient has consented to  Procedure(s): LEFT PARATHYROIDECTOMY (Left) as a surgical intervention.  The patient's history has been reviewed, patient examined, no change in status, stable for surgery.  I have reviewed the patient's chart and labs.  Questions were answered to the patient's satisfaction.     [name]

## 2019-03-28 NOTE — Transfer of Care (Signed)
Immediate Anesthesia Transfer of Care Note  Patient: Erica Navarro  Procedure(s) Performed: LEFT PARATHYROIDECTOMY (Left Neck)  Patient Location: PACU  Anesthesia Type:General  Level of Consciousness: awake, alert , oriented and patient cooperative  Airway & Oxygen Therapy: Patient Spontanous Breathing and Patient connected to face mask  Post-op Assessment: Report given to RN and Post -op Vital signs reviewed and stable  Post vital signs: Reviewed and stable  Last Vitals:  Vitals Value Taken Time  BP 137/68 03/28/2019 11:22 AM  Temp    Pulse 81 03/28/2019 11:23 AM  Resp 17 03/28/2019 11:23 AM  SpO2 98 % 03/28/2019 11:23 AM  Vitals shown include unvalidated device data.  Last Pain:  Vitals:   03/28/19 0822  TempSrc:   PainSc: 0-No pain         Complications: No apparent anesthesia complications

## 2019-03-28 NOTE — Anesthesia Procedure Notes (Signed)
Procedure Name: Intubation Date/Time: 03/28/2019 10:20 AM Performed by: Claudia Desanctis, CRNA Pre-anesthesia Checklist: Patient identified, Emergency Drugs available, Suction available and Patient being monitored Patient Re-evaluated:Patient Re-evaluated prior to induction Oxygen Delivery Method: Circle system utilized Preoxygenation: Pre-oxygenation with 100% oxygen Induction Type: IV induction Ventilation: Mask ventilation without difficulty Laryngoscope Size: 2, Miller and 3 Grade View: Grade II Tube type: Oral Number of attempts: 1 Airway Equipment and Method: Stylet Placement Confirmation: ETT inserted through vocal cords under direct vision,  positive ETCO2 and breath sounds checked- equal and bilateral Secured at: 21 cm Tube secured with: Tape Dental Injury: Teeth and Oropharynx as per pre-operative assessment  Comments: Anterior view

## 2019-03-28 NOTE — Anesthesia Postprocedure Evaluation (Signed)
Anesthesia Post Note  Patient: Erica Navarro  Procedure(s) Performed: LEFT PARATHYROIDECTOMY (Left Neck)     Patient location during evaluation: PACU Anesthesia Type: General Level of consciousness: awake Pain management: pain level controlled Vital Signs Assessment: post-procedure vital signs reviewed and stable Respiratory status: spontaneous breathing Cardiovascular status: stable Postop Assessment: no apparent nausea or vomiting Anesthetic complications: no    Last Vitals:  Vitals:   03/28/19 1215 03/28/19 1300  BP: 123/73   Pulse: 78   Resp: 12   Temp:  36.7 C  SpO2: 95%     Last Pain:  Vitals:   03/28/19 1300  TempSrc:   PainSc: 3    Pain Goal:                   Huston Foley

## 2019-03-29 ENCOUNTER — Encounter (HOSPITAL_COMMUNITY): Payer: Self-pay | Admitting: Surgery

## 2019-04-09 DIAGNOSIS — E892 Postprocedural hypoparathyroidism: Secondary | ICD-10-CM | POA: Diagnosis not present

## 2019-04-09 DIAGNOSIS — E210 Primary hyperparathyroidism: Secondary | ICD-10-CM | POA: Diagnosis not present

## 2019-04-14 DIAGNOSIS — Z1231 Encounter for screening mammogram for malignant neoplasm of breast: Secondary | ICD-10-CM | POA: Diagnosis not present

## 2019-04-14 LAB — HM MAMMOGRAPHY

## 2019-04-16 ENCOUNTER — Encounter: Payer: Self-pay | Admitting: Family Medicine

## 2019-05-28 NOTE — Progress Notes (Signed)
[place] [facility] [address], [place], [place] [address] [phone]  Date:  06/02/2019   Name:  Erica Navarro   DOB:  [birth year]   MRN:  [id number]  PCP:  Darreld Mclean, MD    Chief Complaint: Hypertension (6 month follow up)   History of Present Illness:  Erica Navarro is a 68 y.o. very pleasant female patient who presents with the following:  Periodic follow-up visit today History of vit D def, hyperparathyroidism s/p surgery in June, HTN, hyperlipidemia, osteoporosis  Dr Cruzita Lederer is her endocrinologist- she is seeing her for follow-up later this month Will defer labs to Dr. Darnell Level as I am not certain what she will want to order  She is working - has been quite busy during the pandemic!   May be due for tetanus- however she has no wound so medicare will not cover at office Colon due this year- she did have polyps, needs a colonoscopy.  She is actually overdue but put it off due to insurance concerns  I will refer her now  dexa a year ago   Her home BP machine does not seem to be working well - it is 70 years old  She plans to get a new one  No other symptoms or concerns right now   BP Readings from Last 3 Encounters:  06/02/19 (!) 146/80  03/28/19 121/71  03/21/19 (!) 159/76    Lab Results  Component Value Date   HGBA1C 5.5 12/02/2018    Patient Active Problem List   Diagnosis Date Noted  . Ventral hernia without obstruction or gangrene 10/30/2016  . Sedentary lifestyle 10/30/2016  . Osteoporosis 06/02/2016  . Vitamin D insufficiency 06/02/2016  . Primary hyperparathyroidism ([place]) 06/04/2015  . HTN (hypertension) 11/11/2012  . High cholesterol 11/11/2012    Past Medical History:  Diagnosis Date  . Hyperlipidemia   . Hypertension   . Hyperthyroidism     Past Surgical History:  Procedure Laterality Date  . COLONOSCOPY    . PARATHYROIDECTOMY Left 03/28/2019   Procedure: LEFT PARATHYROIDECTOMY;   Surgeon: [name], MD;  Location: [facility];  Service: General;  Laterality: Left;    Social History   Tobacco Use  . Smoking status: Never Smoker  . Smokeless tobacco: Never Used  Substance Use Topics  . Alcohol use: No  . Drug use: No    History reviewed. No pertinent family history.  Allergies  Allergen Reactions  . Lisinopril Swelling    hands and face swell    Medication list has been reviewed and updated.  Current Outpatient Medications on File Prior to Visit  Medication Sig Dispense Refill  . alendronate (FOSAMAX) 70 MG tablet TAKE 1 TABLET BY MOUTH ONCE A WEEK. TAKE WITH A FULL GLASS OF WATER ON AN EMPTY STOMACH (Patient taking differently: Take 70 mg by mouth every Tuesday. ) 12 tablet 4  . amLODipine (NORVASC) 10 MG tablet Take 1 tablet (10 mg total) by mouth daily. 90 tablet 4  . aspirin 81 MG tablet Take 81 mg by mouth daily.    . Cholecalciferol 1000 UNITS capsule Take 1,000 Units by mouth daily.    [name] (GLUCOSAMINE MSM COMPLEX) TABS Take 1 tablet by mouth 2 (two) times daily.    . pravastatin (PRAVACHOL) 40 MG tablet Take 1 tablet (40 mg total) by mouth daily. (Patient taking differently: Take 40 mg by mouth at bedtime. ) 90 tablet 4  No current facility-administered medications on file prior to visit.     Review of Systems:  As per HPI- otherwise negative. No fever or chills No CP or SOB   Physical Examination: Vitals:   06/02/19 0826  BP: (!) 146/80  Pulse: 77  Resp: 16  Temp: 98.2 F (36.8 C)  SpO2: 99%   Vitals:   06/02/19 0826  Weight: 178 lb (80.7 kg)  Height: 5\' 3"  (1.6 m)   Body mass index is 31.53 kg/m. Ideal Body Weight: Weight in (lb) to have BMI = 25: 140.8  GEN: WDWN, NAD, Non-toxic, A & O x 3, overweight, looks well  HEENT: Atraumatic, Normocephalic. Neck supple. No masses, No LAD. Ears and Nose: No external deformity. CV: RRR, No M/G/R. No JVD. No thrill. No extra heart sounds. PULM: CTA B, no  wheezes, crackles, rhonchi. No retractions. No resp. distress. No accessory muscle use. EXTR: No c/c/e NEURO Normal gait.  PSYCH: Normally interactive. Conversant. Not depressed or anxious appearing.  Calm demeanor.    Assessment and Plan:   ICD-10-CM   1. Essential hypertension  I10   2. Mixed hyperlipidemia  E78.2   3. Other osteoporosis without current pathological fracture  M81.8   4. Hyperparathyroidism, primary ([place])  E21.0   5. Elevated glucose level  R73.09   6. Screening for colon cancer  Z12.11 Ambulatory referral to Gastroenterology   Following up today Referral to GI for screening colon Her BP is borderline She is on max dose of amlodipine Would have to use BB or diuretic for her if we increase her regimen She will purchase a new BP cuff and let me know what readings she gets at home If she continues to be above goal we will add a medication for her   Encouraged tetanus vaccine at the pharmacy  Continue fosamax and pravachol  Repeat Dexa next year    Follow-up: No follow-ups on file.  No orders of the defined types were placed in this encounter.  Orders Placed This Encounter  Procedures  . Ambulatory referral to Gastroenterology    @SIGN @    Signed Lamar Blinks, MD

## 2019-06-02 ENCOUNTER — Encounter: Payer: Self-pay | Admitting: Family Medicine

## 2019-06-02 ENCOUNTER — Ambulatory Visit (INDEPENDENT_AMBULATORY_CARE_PROVIDER_SITE_OTHER): Payer: Medicare HMO | Admitting: Family Medicine

## 2019-06-02 ENCOUNTER — Encounter: Payer: Self-pay | Admitting: Gastroenterology

## 2019-06-02 ENCOUNTER — Other Ambulatory Visit: Payer: Self-pay

## 2019-06-02 VITALS — BP 146/80 | HR 77 | Temp 98.2°F | Resp 16 | Ht 63.0 in | Wt 178.0 lb

## 2019-06-02 DIAGNOSIS — R7309 Other abnormal glucose: Secondary | ICD-10-CM | POA: Diagnosis not present

## 2019-06-02 DIAGNOSIS — M818 Other osteoporosis without current pathological fracture: Secondary | ICD-10-CM | POA: Diagnosis not present

## 2019-06-02 DIAGNOSIS — I10 Essential (primary) hypertension: Secondary | ICD-10-CM | POA: Diagnosis not present

## 2019-06-02 DIAGNOSIS — E210 Primary hyperparathyroidism: Secondary | ICD-10-CM

## 2019-06-02 DIAGNOSIS — E782 Mixed hyperlipidemia: Secondary | ICD-10-CM

## 2019-06-02 DIAGNOSIS — Z1211 Encounter for screening for malignant neoplasm of colon: Secondary | ICD-10-CM

## 2019-06-02 NOTE — Patient Instructions (Addendum)
Great to see you today!  I referred you back to gastroenterology to have your colon cancer screening Please purchase a new home BP cuff and let me know what sort of readings you are getting.  If your BP remains high we will need to adjust your medication regimen  Our BP goal is 130/85 or less   You are due for a tetanus vaccine- please have this done at the pharmacy  Please plan to see me in 4-6 months

## 2019-06-11 ENCOUNTER — Other Ambulatory Visit: Payer: Self-pay

## 2019-06-13 ENCOUNTER — Ambulatory Visit: Payer: Medicare HMO | Admitting: Internal Medicine

## 2019-06-13 ENCOUNTER — Encounter: Payer: Self-pay | Admitting: Internal Medicine

## 2019-06-13 ENCOUNTER — Other Ambulatory Visit: Payer: Self-pay

## 2019-06-13 VITALS — BP 180/80 | HR 75 | Ht 63.0 in | Wt 175.0 lb

## 2019-06-13 DIAGNOSIS — E210 Primary hyperparathyroidism: Secondary | ICD-10-CM

## 2019-06-13 DIAGNOSIS — M818 Other osteoporosis without current pathological fracture: Secondary | ICD-10-CM

## 2019-06-13 DIAGNOSIS — I10 Essential (primary) hypertension: Secondary | ICD-10-CM | POA: Diagnosis not present

## 2019-06-13 DIAGNOSIS — E041 Nontoxic single thyroid nodule: Secondary | ICD-10-CM | POA: Diagnosis not present

## 2019-06-13 DIAGNOSIS — E559 Vitamin D deficiency, unspecified: Secondary | ICD-10-CM

## 2019-06-13 LAB — VITAMIN D 25 HYDROXY (VIT D DEFICIENCY, FRACTURES): VITD: 58.85 ng/mL (ref 30.00–100.00)

## 2019-06-13 NOTE — Patient Instructions (Signed)
Please continue Fosamax 70 mg weekly.   Continue vitamin D 3000 units daily.  Please stop at the lab.  Please return in 1 year.

## 2019-06-13 NOTE — Progress Notes (Signed)
Patient ID: Erica Navarro, female   DOB: 04/02/1951, 68 y.o.   MRN: [id number]   HPI  Erica Navarro is a 68 y.o.-year-old female, returning for f/u for primary hyperparathyroidism, vit D insufficiency, and OP. Last visit 1 year ago.  Reviewed and addended history: Pt was dx with hypercalcemia in 2016.  Reviewed pertinent labs: Lab Results  Component Value Date   PTH 65 06/11/2018   PTH Comment 06/11/2018   PTH 46 06/01/2017   PTH 43 06/02/2016   PTH Comment 06/02/2016   PTH 73 (H) 06/04/2015   PTH Comment 06/04/2015   PTH 82 (H) 12/04/2014   PTH 104 (H) 11/24/2014   CALCIUM 10.5 (H) 03/21/2019   CALCIUM 10.3 01/07/2019   CALCIUM 10.9 (H) 12/02/2018   CALCIUM 11.1 (H) 06/11/2018   CALCIUM 11.3 (H) 05/30/2018   CALCIUM 10.5 10/31/2017   CALCIUM 10.7 (H) 06/01/2017   CALCIUM 10.3 11/26/2016   CALCIUM 10.8 (H) 10/30/2016   CALCIUM 10.6 (H) 06/02/2016   She has a history of osteoporosis, but her T-scores improved after starting a bisphosphonate:  06/17/2018 ([facility])  Lumbar spine (L1-L4) Femoral neck (FN)  T-score - 3.1 RFN: - 1.9 LFN: - 2.0   12/03/2015 ([facility])  Lumbar spine (L1-L4) Femoral neck (FN)  T-score - 3.6 RFN: - 2.1 LFN: - 2.4   We started Fosamax 70 mg weekly in 2017 after her DXA scan report returned.  No hip, thigh, jaw pain.  No fractures or falls.  No kidney stones.  No CKD. Last BUN/Cr: Lab Results  Component Value Date   BUN 13 03/21/2019   CREATININE 0.66 03/21/2019   She is not on HCTZ.  She has a history of vitamin D insufficiency, which resolved: Lab Results  Component Value Date   VD25OH 50.51 05/30/2018   VD25OH 48.93 10/31/2017   VD25OH 47.02 06/01/2017   VD25OH 42.29 10/30/2016   VD25OH 36.55 06/02/2016   VD25OH 23.45 (L) 06/04/2015   VD25OH 37.83 12/04/2014   She takes 3000 units vitamin D daily.  Pt does not have a FH of hypercalcemia, pituitary tumors, thyroid cancer, or osteoporosis.   Reviewed initial labs: Component      Latest Ref Rng 12/04/2014  BUN     6 - 23 mg/dL 16  Creatinine     0.40 - 1.20 mg/dL 0.63  Calcium     8.4 - 10.5 mg/dL 10.9 (H)  GFR     >60.00 mL/min 121.86  Vitamin D 1, 25 (OH) Total     18 - 72 pg/mL 96 (H)  Vitamin D3 1, 25 (OH)      96  Vitamin D2 1, 25 (OH)      <8  PTH     15 - 65 pg/mL 82 (H)  Calcium Ionized     1.12 - 1.32 mmol/L 1.50 (H)  VITD     30.00 - 100.00 ng/mL 37.83  Phosphorus     2.3 - 4.6 mg/dL 2.7  Magnesium     1.5 - 2.5 mg/dL 1.9   Component     Latest Ref Rng 01/11/2015 01/11/2015         3:03 PM  3:07 PM  Calcium, Ur      4   Calcium, 24 hour urine     100 - 250 mg/day 112   Creatinine, Urine      50.4 50.5  Creatinine, 24H Ur     700 - 1800 mg/day 1412 1415   Results pointed towards primary hyperparathyroidism.  Her 24-hour urine calcium was not elevated. FeCa= 0.0045 (0.45%).  This was lower than expected from primary hyperparathyroidism, however, there is low suspicion for familial hypocalciuric hypercalcemia due to previously normal calcium levels and also normal magnesium levels.  She initially refused a referral to surgery, but afterwards agreed to referral to Dr. Harlow Asa.  She had the following tests checked since then: Thyroid ultrasound (09/21/2018): 1 thyroid nodule 3.0 x 2.2 x 2.0, solid, isoechoic FNA of this nodule (10/01/2018): Benign Technetium sestamibi scan (09/04/2018): Not localizing 4D CT (12/09/2018): Posterior left thyroid lobe mass measuring 6.9 mm  She had parathyroidectomy on 03/28/2019: Enlarged left posterior parathyroid gland: 721 mg, 2.1 cm in diameter  Her calcium decreased from 11.3 preop to 10.7 postop  (04/09/2019).  Her PTH decreased from 82 preop to 65 postop.  She is doing well after the surgery with no complaints.  She also has a history of HTN, HL.  Stopped fish oil and glucosamine.  ROS: Constitutional: no weight gain/+ intentional 9 lb weight loss (diet, exercise), no fatigue, no subjective  hyperthermia, no subjective hypothermia Eyes: no blurry vision, no xerophthalmia ENT: no sore throat, no nodules palpated in neck, no dysphagia, no odynophagia, no hoarseness Cardiovascular: no CP/no SOB/no palpitations/no leg swelling Respiratory: no cough/no SOB/no wheezing Gastrointestinal: no N/no V/no D/no C/no acid reflux Musculoskeletal: no muscle aches/no joint aches Skin: no rashes, no hair loss Neurological: no tremors/no numbness/no tingling/no dizziness  I reviewed pt's medications, allergies, PMH, social hx, family hx, and changes were documented in the history of present illness. Otherwise, unchanged from my initial visit note.  Past Medical History:  Diagnosis Date  . Hyperlipidemia   . Hypertension   . Hyperthyroidism    Past Surgical History:  Procedure Laterality Date  . COLONOSCOPY    . PARATHYROIDECTOMY Left 03/28/2019   Procedure: LEFT PARATHYROIDECTOMY;  Surgeon: [name], MD;  Location: [facility];  Service: General;  Laterality: Left;   History   Social History  . Marital Status: Single    Spouse Name: N/A  . Number of Children: 4   Occupational History  . janitor   Social History Main Topics  . Smoking status: Never Smoker   . Smokeless tobacco: Not on file  . Alcohol Use: No  . Drug Use: No   Current Outpatient Medications on File Prior to Visit  Medication Sig Dispense Refill  . alendronate (FOSAMAX) 70 MG tablet TAKE 1 TABLET BY MOUTH ONCE A WEEK. TAKE WITH A FULL GLASS OF WATER ON AN EMPTY STOMACH (Patient taking differently: Take 70 mg by mouth every Tuesday. ) 12 tablet 4  . amLODipine (NORVASC) 10 MG tablet Take 1 tablet (10 mg total) by mouth daily. 90 tablet 4  . aspirin 81 MG tablet Take 81 mg by mouth daily.    . Cholecalciferol 1000 UNITS capsule Take 1,000 Units by mouth daily.    [name] (GLUCOSAMINE MSM COMPLEX) TABS Take 1 tablet by mouth 2 (two) times daily.    . pravastatin (PRAVACHOL) 40 MG tablet Take 1  tablet (40 mg total) by mouth daily. (Patient taking differently: Take 40 mg by mouth at bedtime. ) 90 tablet 4   No current facility-administered medications on file prior to visit.    Allergies  Allergen Reactions  . Lisinopril Swelling    hands and face swell   FH: - see HPI + - HTN and heart ds in mother - HL in sister  PE: BP (!) 180/90   Pulse 75  Ht 5\' 3"  (1.6 m)   Wt 175 lb (79.4 kg)   SpO2 97%   BMI 31.00 kg/m  Body mass index is 31 kg/m.  Wt Readings from Last 3 Encounters:  06/13/19 175 lb (79.4 kg)  06/02/19 178 lb (80.7 kg)  03/21/19 175 lb 4 oz (79.5 kg)   Constitutional: overweight, in NAD Eyes: PERRLA, EOMI, no exophthalmos ENT: moist mucous membranes, no thyromegaly, cervical incision healed well, no cervical lymphadenopathy Cardiovascular: RRR, No MRG Respiratory: CTA B Gastrointestinal: abdomen soft, NT, ND, BS+ Musculoskeletal: no deformities, strength intact in all 4 Skin: moist, warm, no rashes Neurological: no tremor with outstretched hands, DTR normal in all 4  Assessment: 1. Primary hyperparathyroidism  2. Vitamin D insufficiency  3. OP  4. R Thyroid nodule  5. HTN  Plan: Patient has had several instances of elevated calcium, with the highest being 11.3  An intact PTH level was also high, at 104, for a calcium of 10.7.  Afterwards, her PTH has decreased since normal levels since then, however, this is inappropriate suppression in the presence of a high calcium level. No history of nephrolithiasis but she does have osteoporosis. We reviewed together the report of her DEXA scan from 2017 and 2019, which showed osteoporosis, but improved after starting Fosamax. Fosamax was also helping keeping her calcium levels under control. -At last visit, she refused parathyroid surgery, but she did not accept this afterwards.  She saw Dr. Harlow Asa who ordered several imaging tests that showed a thyroid nodule measuring 3 cm in the largest dimension which  was biopsied with benign results in 11/2018.  A technetium sestamibi scan was nonlocalizing but the 40 CT showed a posterior left lobe mass which was targeted at the time of the surgery 03/2019.  This turned out to be hyperplastic parathyroid glands and her calcium and PTH decreased after surgery but not quite to normal.  We will repeat this today. -Today we will check: Calcium  Intact PTH ([facility]) -I will see the patient back in a year  2. Vitamin D insufficiency -She had a slightly low vitamin D level after which we increased her vitamin D supplementation to 3000 units daily.  She continues on this dose.  Subsequent vitamin D levels normalized -Latest vitamin D level was normal a year ago.  We will repeat this today.  3. OP -We started Fosamax 70 mg weekly 2017.  She is now 3 years into treatment.  No side effects.  No dental work in progress of coming up.  No GERD. -She is aware of possible side effects: ONJ and atypical fractures -We will wait 1 year after her parathyroid surgery (next visit) and repeat a DXA scan at [facility]  4. Thyroid nodule - R mid nodule, isoechoic, solid, 3 cm in largest dimension - benign by Bx - will continue to follow her clinically for now and rpt the U/S likely in 2 years - no neck compression sxs  5. HTN - BP was high today: 180/90 at the beginning of the appt. On repeat: 180/80 - She did take her Norvasc today - will FWD this to PCP >> will likely need med adjustment  Component     Latest Ref Rng & Units 06/13/2019  Calcium     8.7 - 10.3 mg/dL 10.8 (H)  PTH, Intact     15 - 65 pg/mL 30  PTH Interp      Comment  VITD     30.00 - 100.00 ng/mL 58.85  Calcium is still high. PTH lower than before.  It is possible that it takes longer for her to improve her calcium levels.  I will recheck this in 3 months.  Orders Placed This Encounter  Procedures  . Calcium, ionized   Philemon Kingdom, MD PhD [facility]

## 2019-06-14 LAB — PTH, INTACT AND CALCIUM
Calcium: 10.8 mg/dL — ABNORMAL HIGH (ref 8.7–10.3)
PTH: 30 pg/mL (ref 15–65)

## 2019-07-07 ENCOUNTER — Other Ambulatory Visit: Payer: Self-pay

## 2019-07-07 ENCOUNTER — Ambulatory Visit (AMBULATORY_SURGERY_CENTER): Payer: Self-pay | Admitting: *Deleted

## 2019-07-07 VITALS — Temp 96.9°F | Ht 63.0 in | Wt 174.0 lb

## 2019-07-07 DIAGNOSIS — Z86010 Personal history of colonic polyps: Secondary | ICD-10-CM

## 2019-07-07 MED ORDER — PEG 3350-KCL-NA BICARB-NACL 420 G PO SOLR: 4000.0000 mL | Freq: Once | ORAL | 0 refills | Status: AC

## 2019-07-07 NOTE — Progress Notes (Signed)
Patient is here in-person for PV. Patient denies any allergies to eggs or soy. Patient denies any problems with anesthesia/sedation. Patient denies any oxygen use at home. Patient denies taking any diet/weight loss medications or blood thinners. EMMI education assisgned to patient on colonoscopy, this was explained and instructions given to patient.Pt is aware that care partner will wait in the car during procedure; if they feel like they will be too hot to wait in the car; they may wait in the lobby.  We want them to wear a mask (we do not have any that we can provide them), practice social distancing, and we will check their temperatures when they get here.  I did remind patient that their care partner needs to stay in the parking lot the entire time. Pt will wear mask into building. 

## 2019-07-09 ENCOUNTER — Encounter: Payer: Self-pay | Admitting: Gastroenterology

## 2019-07-14 DIAGNOSIS — Z9889 Other specified postprocedural states: Secondary | ICD-10-CM | POA: Diagnosis not present

## 2019-07-14 DIAGNOSIS — E210 Primary hyperparathyroidism: Secondary | ICD-10-CM | POA: Diagnosis not present

## 2019-07-18 ENCOUNTER — Telehealth: Payer: Self-pay | Admitting: Gastroenterology

## 2019-07-18 NOTE — Telephone Encounter (Signed)
Pt returned call and answered NO to the Covid-19 screening questions

## 2019-07-18 NOTE — Telephone Encounter (Signed)
LM on vmail to call back regarding Covid-19 screening questions  Do you now or have you had a fever in the last 14 days?  Do you have any respiratory symptoms of shortness of breath or cough now or in the last 14 days?  Do you have any family members or close contacts with diagnosed or suspected Covid-19 in the past 14 days?  Have you been tested for Covid-19 and found to be positive?

## 2019-07-21 ENCOUNTER — Other Ambulatory Visit: Payer: Self-pay

## 2019-07-21 ENCOUNTER — Encounter: Payer: Self-pay | Admitting: Gastroenterology

## 2019-07-21 ENCOUNTER — Ambulatory Visit (AMBULATORY_SURGERY_CENTER): Payer: Medicare HMO | Admitting: Gastroenterology

## 2019-07-21 VITALS — BP 117/68 | HR 51 | Temp 98.4°F | Resp 13 | Ht 63.0 in | Wt 174.0 lb

## 2019-07-21 DIAGNOSIS — I10 Essential (primary) hypertension: Secondary | ICD-10-CM | POA: Diagnosis not present

## 2019-07-21 DIAGNOSIS — E7800 Pure hypercholesterolemia, unspecified: Secondary | ICD-10-CM | POA: Diagnosis not present

## 2019-07-21 DIAGNOSIS — Z86010 Personal history of colonic polyps: Secondary | ICD-10-CM | POA: Diagnosis not present

## 2019-07-21 MED ORDER — SODIUM CHLORIDE 0.9 % IV SOLN: 500.0000 mL | Freq: Once | INTRAVENOUS | Status: DC

## 2019-07-21 NOTE — Patient Instructions (Signed)
Impression/Recommendations:  Hemorrhoid handout given to patient.  Resume previous diet. Continue present medications.  Repeat colonoscopy in 7 years for surveillance.  YOU HAD AN ENDOSCOPIC PROCEDURE TODAY AT [facility]:   Refer to the procedure report that was given to you for any specific questions about what was found during the examination.  If the procedure report does not answer your questions, please call your gastroenterologist to clarify.  If you requested that your care partner not be given the details of your procedure findings, then the procedure report has been included in a sealed envelope for you to review at your convenience later.  YOU SHOULD EXPECT: Some feelings of bloating in the abdomen. Passage of more gas than usual.  Walking can help get rid of the air that was put into your GI tract during the procedure and reduce the bloating. If you had a lower endoscopy (such as a colonoscopy or flexible sigmoidoscopy) you may notice spotting of blood in your stool or on the toilet paper. If you underwent a bowel prep for your procedure, you may not have a normal bowel movement for a few days.  Please Note:  You might notice some irritation and congestion in your nose or some drainage.  This is from the oxygen used during your procedure.  There is no need for concern and it should clear up in a day or so.  SYMPTOMS TO REPORT IMMEDIATELY:   Following lower endoscopy (colonoscopy or flexible sigmoidoscopy):  Excessive amounts of blood in the stool  Significant tenderness or worsening of abdominal pains  Swelling of the abdomen that is new, acute  Fever of 100F or higher  For urgent or emergent issues, a gastroenterologist can be reached at any hour by calling [phone].   DIET:  We do recommend a small meal at first, but then you may proceed to your regular diet.  Drink plenty of fluids but you should avoid alcoholic beverages for 24 hours.  ACTIVITY:   You should plan to take it easy for the rest of today and you should NOT DRIVE or use heavy machinery until tomorrow (because of the sedation medicines used during the test).    FOLLOW UP: Our staff will call the number listed on your records 48-72 hours following your procedure to check on you and address any questions or concerns that you may have regarding the information given to you following your procedure. If we do not reach you, we will leave a message.  We will attempt to reach you two times.  During this call, we will ask if you have developed any symptoms of COVID 19. If you develop any symptoms (ie: fever, flu-like symptoms, shortness of breath, cough etc.) before then, please call [phone].  If you test positive for Covid 19 in the 2 weeks post procedure, please call and report this information to [place].    If any biopsies were taken you will be contacted by phone or by letter within the next 1-3 weeks.  Please call us at [phone] if you have not heard about the biopsies in 3 weeks.    SIGNATURES/CONFIDENTIALITY: You and/or your care partner have signed paperwork which will be entered into your electronic medical record.  These signatures attest to the fact that that the information above on your After Visit Summary has been reviewed and is understood.  Full responsibility of the confidentiality of this discharge information lies with you and/or your care-partner.

## 2019-07-21 NOTE — Progress Notes (Signed)
Pt's states no medical or surgical changes since previsit or office visit.  Vitals CW Temp KA

## 2019-07-21 NOTE — Op Note (Signed)
[facility] Patient Name: Erica Navarro Procedure Date: 07/21/2019 9:11 AM MRN: [id number] Endoscopist: Thornton Park MD, MD Age: 68 Referring MD:  Date of Birth: September 30, 1951 Gender: Female Account #: [id number] Procedure:                Colonoscopy Indications:              Surveillance: Personal history of adenomatous                            polyps on last colonoscopy > 5 years ago                           Colonoscopy with Dr. Sharlett Iles 08/09/09: tubular                            adenoma. Surveillance recommended in 5 years.                           No known family history of colon cancer or polyps.                           No baseline GI symptoms. Medicines:                See the Anesthesia note for documentation of the                            administered medications Procedure:                Pre-Anesthesia Assessment:                           - Prior to the procedure, a History and Physical                            was performed, and patient medications and                            allergies were reviewed. The patient's tolerance of                            previous anesthesia was also reviewed. The risks                            and benefits of the procedure and the sedation                            options and risks were discussed with the patient.                            All questions were answered, and informed consent                            was obtained. Prior Anticoagulants: The patient has  taken no previous anticoagulant or antiplatelet                            agents. ASA Grade Assessment: II - A patient with                            mild systemic disease. After reviewing the risks                            and benefits, the patient was deemed in                            satisfactory condition to undergo the procedure.                           After obtaining informed consent, the colonoscope                             was passed under direct vision. Throughout the                            procedure, the patient's blood pressure, pulse, and                            oxygen saturations were monitored continuously. The                            Colonoscope was introduced through the anus and                            advanced to the the terminal ileum, with                            identification of the appendiceal orifice and IC                            valve. A second forward view of the right colon was                            normal. The colonoscopy was performed without                            difficulty. The patient tolerated the procedure                            well. The quality of the bowel preparation was                            good. The terminal ileum, ileocecal valve,                            appendiceal orifice, and rectum were photographed. Scope In: 9:13:36 AM Scope Out: 9:24:28 AM Scope Withdrawal Time: 0 hours 9 minutes 2 seconds  Total Procedure Duration: 0 hours 10 minutes 52 seconds  Findings:                 The perianal and digital rectal examinations were                            normal.                           Non-bleeding internal hemorrhoids were found. The                            hemorrhoids were small.                           There are a few small hyperplastic polyps in the                            rectum. The exam was otherwise without abnormality                            on direct and retroflexion views. Complications:            No immediate complications. Estimated Blood Loss:     Estimated blood loss: none. Impression:               - Non-bleeding internal hemorrhoids.                           - The examination was otherwise normal on direct                            and retroflexion views.                           - No specimens collected. Recommendation:           - Patient has a contact number available for                             emergencies. The signs and symptoms of potential                            delayed complications were discussed with the                            patient. Return to normal activities tomorrow.                            Written discharge instructions were provided to the                            patient.                           - Resume previous diet today.                           -  Continue present medications.                           - Repeat colonoscopy in 7 years for surveillance                            given the history of a tubular adenoma on the last                            colonoscopy. Thornton Park MD, MD 07/21/2019 9:31:53 AM This report has been signed electronically.

## 2019-08-18 ENCOUNTER — Other Ambulatory Visit: Payer: Self-pay | Admitting: Family Medicine

## 2019-08-18 DIAGNOSIS — M818 Other osteoporosis without current pathological fracture: Secondary | ICD-10-CM

## 2019-09-28 ENCOUNTER — Other Ambulatory Visit: Payer: Self-pay | Admitting: Family Medicine

## 2019-09-28 DIAGNOSIS — J0140 Acute pansinusitis, unspecified: Secondary | ICD-10-CM

## 2019-12-04 ENCOUNTER — Other Ambulatory Visit: Payer: Self-pay | Admitting: Family Medicine

## 2019-12-04 DIAGNOSIS — I10 Essential (primary) hypertension: Secondary | ICD-10-CM

## 2019-12-04 DIAGNOSIS — E782 Mixed hyperlipidemia: Secondary | ICD-10-CM

## 2020-03-05 NOTE — Patient Instructions (Addendum)
It was great to see you again today Please consider having a tetanus booster at your pharmacy at your earliest convenience- I would also encourage you to get your covid 19 series!    I will be in touch with your labs asap- we will look for any cause of your itching  Please try the triamcinolone cream as needed for itchy skin

## 2020-03-05 NOTE — Progress Notes (Addendum)
[facility] [address], [place], [place] [address] [phone]  Date:  03/08/2020   Name:  Erica Navarro   DOB:  1951/09/02   MRN:  [id number]  PCP:  Darreld Mclean, MD    Chief Complaint: Pruritis (all over body, especially where she has strecth marks, over one month, )   History of Present Illness:  Erica Navarro is a 69 y.o. very pleasant female patient who presents with the following:  Patient with history of hypertension, hyperparathyroidism, thyroid nodule, hyperlipidemia, osteoporosis Here today with concern of itching  Last seen by myself in August 2020 Her hyperparathyroidism is managed by Dr. Iran Ouch her last note August 2020 Plan: Patient has had several instances of elevated calcium, with the highest being 11.3  An intact PTH level was also high, at 104, for a calcium of 10.7.  Afterwards, her PTH has decreased since normal levels since then, however, this is inappropriate suppression in the presence of a high calcium level. No history of nephrolithiasis but she does have osteoporosis. We reviewed together the report of her DEXA scan from 2017 and 2019, which showed osteoporosis, but improved after starting Fosamax. Fosamax was also helping keeping her calcium levels under control. -At last visit, she refused parathyroid surgery, but she did not accept this afterwards.  She saw Dr. Harlow Asa who ordered several imaging tests that showed a thyroid nodule measuring 3 cm in the largest dimension which was biopsied with benign results in 11/2018.  A technetium sestamibi scan was nonlocalizing but the 40 CT showed a posterior left lobe mass which was targeted at the time of the surgery 03/2019.  This turned out to be hyperplastic parathyroid glands and her calcium and PTH decreased after surgery but not quite to normal.  We will repeat this today. -Today we will check: Calcium  Intact PTH ([facility]) -I will see the  patient back in a year  2. Vitamin D insufficiency -She had a slightly low vitamin D level after which we increased her vitamin D supplementation to 3000 units daily.  She continues on this dose.  Subsequent vitamin D levels normalized -Latest vitamin D level was normal a year ago.  We will repeat this today.  3. OP -We started Fosamax 70 mg weekly 2017.  She is now 3 years into treatment.  No side effects.  No dental work in progress of coming up.  No GERD. -She is aware of possible side effects: ONJ and atypical fractures -We will wait 1 year after her parathyroid surgery (next visit) and repeat a D[facility] scan at [facility]  4. Thyroid nodule - R mid nodule, isoechoic, solid, 3 cm in largest dimension - benign by Bx - will continue to follow her clinically for now and rpt the U/S likely in 2 years - no neck compression sxs  5. HTN - BP was high today: 180/90 at the beginning of the appt. On repeat: 180/80 - She did take her Norvasc today - will FWD this to PCP >> will likely need med adjustment  COVID-19 series- encouraged her to get this as soon as she feels ready  Tetanus vaccine Mammogram up-to-date Colon cancer screen up-to-date Due for routine labs, calcium, vitamin D level  Today she has noted itching that will wax and wane- may occur on her back, knees, and at the sock line, and around her waist She has noted this for about one month She tried some home  remedies- she used cetaphil, seravie, oatmeal baths No new meds or supplements She lives with her son who is disabled- he has not mentioned any itching  No bites on her body, no visible rash or hives  They have an outdoor dog only   Her home BP is ok- generally 130/80  Patient Active Problem List   Diagnosis Date Noted  . Right thyroid nodule 06/13/2019  . Ventral hernia without obstruction or gangrene 10/30/2016  . Sedentary lifestyle 10/30/2016  . Osteoporosis 06/02/2016  . Vitamin D insufficiency 06/02/2016  .  Primary hyperparathyroidism ([place]) 06/04/2015  . HTN (hypertension) 11/11/2012  . High cholesterol 11/11/2012    Past Medical History:  Diagnosis Date  . Hyperlipidemia   . Hypertension   . Hyperthyroidism     Past Surgical History:  Procedure Laterality Date  . COLONOSCOPY  08/10/2019  . PARATHYROIDECTOMY Left 03/28/2019   Procedure: LEFT PARATHYROIDECTOMY;  Surgeon: [name], MD;  Location: [facility];  Service: General;  Laterality: Left;  . POLYPECTOMY      Social History   Tobacco Use  . Smoking status: Never Smoker  . Smokeless tobacco: Never Used  Substance Use Topics  . Alcohol use: No  . Drug use: No    Family History  Problem Relation Age of Onset  . Colon cancer Neg Hx   . Colon polyps Neg Hx   . Esophageal cancer Neg Hx   . Rectal cancer Neg Hx   . Stomach cancer Neg Hx     Allergies  Allergen Reactions  . Lisinopril Swelling    hands and face swell    Medication list has been reviewed and updated.  Current Outpatient Medications on File Prior to Visit  Medication Sig Dispense Refill  . alendronate (FOSAMAX) 70 MG tablet Take 1 tablet (70 mg total) by mouth every Tuesday. 12 tablet 4  . amLODipine (NORVASC) 10 MG tablet TAKE 1 TABLET BY MOUTH EVERY DAY 90 tablet 4  . aspirin 81 MG tablet Take 81 mg by mouth daily.    . Cholecalciferol 1000 UNITS capsule Take 1,000 Units by mouth daily.    . pravastatin (PRAVACHOL) 40 MG tablet Take 1 tablet (40 mg total) by mouth at bedtime. 90 tablet 3   No current facility-administered medications on file prior to visit.    Review of Systems:  As per HPI- otherwise negative.   Physical Examination: Vitals:   03/08/20 1336  BP: (!) 160/82  Pulse: 82  Resp: 17  Temp: 98 F (36.7 C)  SpO2: 97%   Vitals:   03/08/20 1336  Weight: 165 lb (74.8 kg)  Height: 5\' 3"  (1.6 m)   Body mass index is 29.23 kg/m. Ideal Body Weight: Weight in (lb) to have BMI = 25: 140.8  GEN: no acute distress.  Overweight,  otherwise looks well HEENT: Atraumatic, Normocephalic.   Bilateral TM wnl, oropharynx normal.  PEERL,EOMI.   Ears and Nose: No external deformity. CV: RRR, No M/G/R. No JVD. No thrill. No extra heart sounds. PULM: CTA B, no wheezes, crackles, rhonchi. No retractions. No resp. distress. No accessory muscle use. ABD: S, NT, ND, +BS. No rebound. No HSM. EXTR: No c/c/e PSYCH: Normally interactive. Conversant.  No evidence of urticaria, bites, or other skin outbreak.  Skin surface appears normal   Assessment and Plan: Other osteoporosis without current pathological fracture - Plan: DG Bone Density  Essential hypertension  Mixed hyperlipidemia  Primary hyperparathyroidism ([place]) - Plan: Comprehensive metabolic panel, VITAMIN D 25 Hydroxy (Vit-D  Deficiency, Fractures)  History of vitamin D deficiency - Plan: VITAMIN D 25 Hydroxy (Vit-D Deficiency, Fractures)  Itching - Plan: CBC, Comprehensive metabolic panel, Ferritin, triamcinolone cream (KENALOG) 0.1 %  Patient here today with concern of itching.  She does have history of hyperparathyroidism, status post parathyroidectomy.  We will check her calcium level today, also iron and liver function. We will have her try triamcinolone as needed in the meantime- Will plan further follow- up pending labs.  Due for vitamin D check Blood pressure is high today, patient states when she checks it at home it is normal-asked her to continue to monitor Due for bone density Moderate medical decision making today  This visit occurred during the SARS-CoV-2 public health emergency.  Safety protocols were in place, including screening questions prior to the visit, additional usage of staff PPE, and extensive cleaning of exam room while observing appropriate contact time as indicated for disinfecting solutions.    Signed Lamar Blinks, MD  Addendum 5/19, received her labs as below.  Letter to patient Mild hypercalcemia is stable to improved Results for  orders placed or performed in visit on 03/08/20  CBC  Result Value Ref Range   WBC 6.1 4.0 - 10.5 K/uL   RBC 4.51 3.87 - 5.11 Mil/uL   Platelets 301.0 150.0 - 400.0 K/uL   Hemoglobin 13.2 12.0 - 15.0 g/dL   HCT 39.4 36.0 - 46.0 %   MCV 87.2 78.0 - 100.0 fl   MCHC 33.5 30.0 - 36.0 g/dL   RDW 14.1 11.5 - 15.5 %  Comprehensive metabolic panel  Result Value Ref Range   Sodium 134 (L) 135 - 145 mEq/L   Potassium 4.4 3.5 - 5.1 mEq/L   Chloride 100 96 - 112 mEq/L   CO2 28 19 - 32 mEq/L   Glucose, Bld 102 (H) 70 - 99 mg/dL   BUN 16 6 - 23 mg/dL   Creatinine, Ser 0.53 0.40 - 1.20 mg/dL   Total Bilirubin 0.4 0.2 - 1.2 mg/dL   Alkaline Phosphatase 87 39 - 117 U/L   AST 17 0 - 37 U/L   ALT 12 0 - 35 U/L   Total Protein 7.3 6.0 - 8.3 g/dL   Albumin 4.3 3.5 - 5.2 g/dL   GFR 137.78 >60.00 mL/min   Calcium 10.6 (H) 8.4 - 10.5 mg/dL  VITAMIN D 25 Hydroxy (Vit-D Deficiency, Fractures)  Result Value Ref Range   VITD 62.75 30.00 - 100.00 ng/mL  Ferritin  Result Value Ref Range   Ferritin 67.7 10.0 - 291.0 ng/mL

## 2020-03-08 ENCOUNTER — Other Ambulatory Visit: Payer: Self-pay

## 2020-03-08 ENCOUNTER — Ambulatory Visit (INDEPENDENT_AMBULATORY_CARE_PROVIDER_SITE_OTHER): Payer: Medicare HMO | Admitting: Family Medicine

## 2020-03-08 VITALS — BP 160/82 | HR 82 | Temp 98.0°F | Resp 17 | Ht 63.0 in | Wt 165.0 lb

## 2020-03-08 DIAGNOSIS — E210 Primary hyperparathyroidism: Secondary | ICD-10-CM

## 2020-03-08 DIAGNOSIS — L299 Pruritus, unspecified: Secondary | ICD-10-CM

## 2020-03-08 DIAGNOSIS — E782 Mixed hyperlipidemia: Secondary | ICD-10-CM

## 2020-03-08 DIAGNOSIS — M818 Other osteoporosis without current pathological fracture: Secondary | ICD-10-CM

## 2020-03-08 DIAGNOSIS — Z8639 Personal history of other endocrine, nutritional and metabolic disease: Secondary | ICD-10-CM | POA: Diagnosis not present

## 2020-03-08 DIAGNOSIS — I10 Essential (primary) hypertension: Secondary | ICD-10-CM

## 2020-03-08 LAB — COMPREHENSIVE METABOLIC PANEL
ALT: 12 U/L (ref 0–35)
AST: 17 U/L (ref 0–37)
Albumin: 4.3 g/dL (ref 3.5–5.2)
Alkaline Phosphatase: 87 U/L (ref 39–117)
BUN: 16 mg/dL (ref 6–23)
CO2: 28 mEq/L (ref 19–32)
Calcium: 10.6 mg/dL — ABNORMAL HIGH (ref 8.4–10.5)
Chloride: 100 mEq/L (ref 96–112)
Creatinine, Ser: 0.53 mg/dL (ref 0.40–1.20)
GFR: 137.78 mL/min (ref >60.00)
Glucose, Bld: 102 mg/dL — ABNORMAL HIGH (ref 70–99)
Potassium: 4.4 mEq/L (ref 3.5–5.1)
Sodium: 134 mEq/L — ABNORMAL LOW (ref 135–145)
Total Bilirubin: 0.4 mg/dL (ref 0.2–1.2)
Total Protein: 7.3 g/dL (ref 6.0–8.3)

## 2020-03-08 LAB — CBC
HCT: 39.4 % (ref 36.0–46.0)
Hemoglobin: 13.2 g/dL (ref 12.0–15.0)
MCHC: 33.5 g/dL (ref 30.0–36.0)
MCV: 87.2 fl (ref 78.0–100.0)
Platelets: 301 10*3/uL (ref 150.0–400.0)
RBC: 4.51 Mil/uL (ref 3.87–5.11)
RDW: 14.1 % (ref 11.5–15.5)
WBC: 6.1 10*3/uL (ref 4.0–10.5)

## 2020-03-08 MED ORDER — TRIAMCINOLONE ACETONIDE 0.1 % EX CREA: 1.0000 "application " | TOPICAL_CREAM | Freq: Two times a day (BID) | CUTANEOUS | 3 refills | Status: DC

## 2020-03-09 LAB — FERRITIN: Ferritin: 67.7 ng/mL (ref 10.0–291.0)

## 2020-03-09 LAB — VITAMIN D 25 HYDROXY (VIT D DEFICIENCY, FRACTURES): VITD: 62.75 ng/mL (ref 30.00–100.00)

## 2020-06-10 ENCOUNTER — Other Ambulatory Visit: Payer: Self-pay | Admitting: Internal Medicine

## 2020-06-10 DIAGNOSIS — E210 Primary hyperparathyroidism: Secondary | ICD-10-CM

## 2020-06-11 ENCOUNTER — Encounter: Payer: Self-pay | Admitting: Internal Medicine

## 2020-06-11 ENCOUNTER — Ambulatory Visit: Payer: Medicare HMO | Admitting: Internal Medicine

## 2020-06-11 ENCOUNTER — Other Ambulatory Visit: Payer: Self-pay

## 2020-06-11 VITALS — BP 130/80 | HR 81 | Ht 63.0 in | Wt 171.0 lb

## 2020-06-11 DIAGNOSIS — M818 Other osteoporosis without current pathological fracture: Secondary | ICD-10-CM

## 2020-06-11 DIAGNOSIS — E210 Primary hyperparathyroidism: Secondary | ICD-10-CM | POA: Diagnosis not present

## 2020-06-11 DIAGNOSIS — E041 Nontoxic single thyroid nodule: Secondary | ICD-10-CM

## 2020-06-11 DIAGNOSIS — E559 Vitamin D deficiency, unspecified: Secondary | ICD-10-CM | POA: Diagnosis not present

## 2020-06-11 LAB — TSH: TSH: 0.91 u[IU]/mL (ref 0.35–4.50)

## 2020-06-11 NOTE — Patient Instructions (Signed)
Please stop at the lab.  If you are not called by [facility] within a week, please give them a call to schedule the bone density scan.  Please continue vitamin D 3000 units daily.  Please come back for a follow-up appointment in 1 year.

## 2020-06-11 NOTE — Progress Notes (Addendum)
Patient ID: Erica Navarro, female   DOB: May 16, 1951, 69 y.o.   MRN: [id number]  This visit occurred during the SARS-CoV-2 public health emergency.  Safety protocols were in place, including screening questions prior to the visit, additional usage of staff PPE, and extensive cleaning of exam room while observing appropriate contact time as indicated for disinfecting solutions.   HPI  Erica Navarro is a 69 y.o.-year-old female, returning for f/u for primary hyperparathyroidism, vit D insufficiency, and OP. Last visit 1 year ago.  Reviewed and addended history: Pt was dx with hypercalcemia in 2016.  Reviewed pertinent labs: Lab Results  Component Value Date   PTH 30 06/13/2019   PTH Comment 06/13/2019   PTH 65 06/11/2018   PTH Comment 06/11/2018   PTH 46 06/01/2017   PTH 43 06/02/2016   PTH Comment 06/02/2016   PTH 73 (H) 06/04/2015   PTH Comment 06/04/2015   PTH 82 (H) 12/04/2014   CALCIUM 10.6 (H) 03/08/2020   CALCIUM 10.8 (H) 06/13/2019   CALCIUM 10.5 (H) 03/21/2019   CALCIUM 10.3 01/07/2019   CALCIUM 10.9 (H) 12/02/2018   CALCIUM 11.1 (H) 06/11/2018   CALCIUM 11.3 (H) 05/30/2018   CALCIUM 10.5 10/31/2017   CALCIUM 10.7 (H) 06/01/2017   CALCIUM 10.3 11/26/2016   Osteoporosis:  We started Fosamax 70 mg weekly 2017.  No hip, thigh, jaw pain.    T-scores improved after addition of bisphosphonate: 06/17/2018 [facility])  Lumbar spine (L1-L4) Femoral neck (FN)  T-score - 3.1 RFN: - 1.9 LFN: - 2.0   12/03/2015 ([facility])  Lumbar spine (L1-L4) Femoral neck (FN)  T-score - 3.6 RFN: - 2.1 LFN: - 2.4   No history of kidney stones.  No CKD. Last BUN/Cr: Lab Results  Component Value Date   BUN 16 03/08/2020   CREATININE 0.53 03/08/2020   She is not on HCTZ.  Vitamin D insufficiency, now resolved: Lab Results  Component Value Date   VD25OH 62.75 03/08/2020   VD25OH 58.85 06/13/2019   VD25OH 50.51 05/30/2018   VD25OH 48.93 10/31/2017   VD25OH 47.02 06/01/2017   VD25OH 42.29  10/30/2016   VD25OH 36.55 06/02/2016   VD25OH 23.45 (L) 06/04/2015   VD25OH 37.83 12/04/2014   She continues on 3000 units vitamin D daily.  Pt does not have a FH of hypercalcemia, pituitary tumors, thyroid cancer, or osteoporosis.   Reviewed her initial labs: Component     Latest Ref Rng 12/04/2014  BUN     6 - 23 mg/dL 16  Creatinine     0.40 - 1.20 mg/dL 0.63  Calcium     8.4 - 10.5 mg/dL 10.9 (H)  GFR     >60.00 mL/min 121.86  Vitamin D 1, 25 (OH) Total     18 - 72 pg/mL 96 (H)  Vitamin D3 1, 25 (OH)      96  Vitamin D2 1, 25 (OH)      <8  PTH     15 - 65 pg/mL 82 (H)  Calcium Ionized     1.12 - 1.32 mmol/L 1.50 (H)  VITD     30.00 - 100.00 ng/mL 37.83  Phosphorus     2.3 - 4.6 mg/dL 2.7  Magnesium     1.5 - 2.5 mg/dL 1.9   Component     Latest Ref Rng 01/11/2015 01/11/2015         3:03 PM  3:07 PM  Calcium, Ur      4   Calcium, 24 hour urine  100 - 250 mg/day 112   Creatinine, Urine      50.4 50.5  Creatinine, 24H Ur     700 - 1800 mg/day 1412 1415   Results pointed towards primary hyperparathyroidism. Her 24-hour urine calcium was not elevated. FeCa= 0.0045 (0.45%).  This was lower than expected from primary hyperparathyroidism, however, there was low suspicion for familial hypocalciuric hypercalcemia due to previously normal calcium levels and also normal magnesium levels.  She initially refused a referral to surgery, but afterwards agreed with a referral to Dr. Harlow Asa.  Reviewed the results of her previous tests: Thyroid ultrasound (09/21/2018): 1 thyroid nodule 3.0 x 2.2 x 2.0, solid, isoechoic FNA of this nodule (10/01/2018): Benign Technetium sestamibi scan (09/04/2018): Not localizing 4D CT (12/09/2018): Posterior left thyroid lobe mass measuring 6.9 mm  She had parathyroidectomy on 03/28/2019: Enlarged left posterior parathyroid gland: 721 mg, 2.1 cm in diameter  Her calcium decreased from 11.3 preop to 10.7 postop  (04/09/2019).  Her PTH  decreased from 82 preop to 65 postop.  Most recent calcium level was still slightly elevated at 10.6 in 02/2020.  She is doing well after her parathyroid surgery, without complaints.  She also has a history of HTN, HL.  Stopped fish oil and glucosamine.  ROS: Constitutional: no weight gain/no weight loss, no fatigue, no subjective hyperthermia, no subjective hypothermia Eyes: no blurry vision, no xerophthalmia ENT: no sore throat, no nodules palpated in neck, no dysphagia, no odynophagia, no hoarseness Cardiovascular: no CP/no SOB/no palpitations/no leg swelling Respiratory: no cough/no SOB/no wheezing Gastrointestinal: no N/no V/no D/no C/no acid reflux Musculoskeletal: no muscle aches/no joint aches Skin: no rashes, no hair loss Neurological: no tremors/no numbness/no tingling/no dizziness  I reviewed pt's medications, allergies, PMH, social hx, family hx, and changes were documented in the history of present illness. Otherwise, unchanged from my initial visit note.  Past Medical History:  Diagnosis Date  . Hyperlipidemia   . Hypertension   . Hyperthyroidism    Past Surgical History:  Procedure Laterality Date  . COLONOSCOPY  08/10/2019  . PARATHYROIDECTOMY Left 03/28/2019   Procedure: LEFT PARATHYROIDECTOMY;  Surgeon: [name], MD;  Location: [facility];  Service: General;  Laterality: Left;  . POLYPECTOMY     History   Social History  . Marital Status: Single    Spouse Name: N/A  . Number of Children: 4   Occupational History  . janitor   Social History Main Topics  . Smoking status: Never Smoker   . Smokeless tobacco: Not on file  . Alcohol Use: No  . Drug Use: No   Current Outpatient Medications on File Prior to Visit  Medication Sig Dispense Refill  . alendronate (FOSAMAX) 70 MG tablet Take 1 tablet (70 mg total) by mouth every Tuesday. 12 tablet 4  . amLODipine (NORVASC) 10 MG tablet TAKE 1 TABLET BY MOUTH EVERY DAY 90 tablet 4  . aspirin 81 MG tablet Take  81 mg by mouth daily.    . Cholecalciferol 1000 UNITS capsule Take 1,000 Units by mouth daily.    . pravastatin (PRAVACHOL) 40 MG tablet Take 1 tablet (40 mg total) by mouth at bedtime. 90 tablet 3  . triamcinolone cream (KENALOG) 0.1 % Apply 1 application topically 2 (two) times daily. 120 g 3   No current facility-administered medications on file prior to visit.   Allergies  Allergen Reactions  . Lisinopril Swelling    hands and face swell   FH: - see HPI + - HTN and  heart ds in mother - HL in sister  PE: BP 130/80   Pulse 81   Ht 5\' 3"  (1.6 m)   Wt 171 lb (77.6 kg)   SpO2 98%   BMI 30.29 kg/m  Body mass index is 30.29 kg/m.  Wt Readings from Last 3 Encounters:  06/11/20 171 lb (77.6 kg)  03/08/20 165 lb (74.8 kg)  07/21/19 174 lb (78.9 kg)   Constitutional: overweight, in NAD Eyes: PERRLA, EOMI, no exophthalmos ENT: moist mucous membranes, no thyromegaly, cervical scar healed, no cervical lymphadenopathy Cardiovascular: RRR, No MRG Respiratory: CTA B Gastrointestinal: abdomen soft, NT, ND, BS+ Musculoskeletal: no deformities, strength intact in all 4 Skin: moist, warm, no rashes Neurological: no tremor with outstretched hands, DTR normal in all 4  Assessment: 1. Primary hyperparathyroidism  2. Vitamin D insufficiency  3. OP  4. R Thyroid nodule  5. HTN  Plan: Patient has had several instances of elevated calcium, with the highest being 11.3  An intact PTH level was also high, at 104, for a calcium of 10.7.  Afterwards, her PTH has decreased since normal levels since then, however, this was inappropriate suppression in the presence of a high calcium level.  No history of nephrolithiasis but she does have osteoporosis.  She is on Fosamax which helped with lowering calcium levels. After thorough investigation she was found to have primary hyperparathyroidism.  A technetium sestamibi scan was nonlocalizing but a 4D CT showed a posterior left lobe mass consistent  with a parathyroid adenoma.  She had parathyroidectomy in 03/2019 by Dr. Harlow Asa.  The posterior left lobe mass turned out to be a hyperplastic parathyroid gland and her calcium and PTH decreased after surgery but not quite to normal.  Latest calcium level was still slightly high at 10.6 on 03/08/2020. Of note, a vitamin D level was normal, and 62.75 at that time. -Today we will recheck a calcium and PTH level ([facility]).  If her calcium remains only slightly elevated, I would suggest to just follow her clinically and biochemically without reintervention.  She agrees with this plan. -I will see the patient back in 1 year  2. Vitamin D insufficiency -She had a slightly low vitamin D level after which we increased her vitamin D supplementation to 3000 units daily.  She continues on this dose. -Vitamin D level was normal at last visit 1 year ago and then again 3 months ago.  We will not repeat this today.  3. OP -We started Fosamax 70 mg weekly in 2017.  She is now 4 years into treatment.  No side effects.  No dental work in progress for coming up. -She is aware of possible side effects: ONJ and atypical fractures -No falls or fractures since last visit -She is now 1 year after her parathyroid surgery and will repeat D[facility] (adding a 33% distal radius).  If significantly improved, we may give her a drug holiday.  4. Thyroid nodule -She has a right mid thyroid nodule, isoechoic, solid, 3 cm in the largest dimension.  This was biopsied with benign results -She denies neck compression symptoms -We will continue to follow her clinically and repeat the ultrasound next year - Most recent TSH was normal: Lab Results  Component Value Date   TSH 1.04 10/30/2016  - will repeat a TSH now  Component     Latest Ref Rng & Units 06/11/2020  Calcium     8.7 - 10.3 mg/dL 10.6 (H)  PTH, Intact  15 - 65 pg/mL 55  PTH Interp      Comment  TSH     0.35 - 4.50 uIU/mL 0.91  TSH is normal.  Calcium  is slightly elevated, but stable.  PTH nonsuppressed.  For now, we can continue to follow her expectantly.  07/16/2020 ([facility])  Lumbar spine (L1-L4) Femoral neck (FN)  T-score - 3.1 RFN: - 1.9 LFN: - 2.6  Scores stable at the level of the lumbar spine and right femoral neck but decreased at the left femoral neck.  I discussed with PCP and we decided to change alendronate to Prolia.  Philemon Kingdom, MD PhD [facility] Endocrinology

## 2020-06-12 LAB — PTH, INTACT AND CALCIUM
Calcium: 10.6 mg/dL — ABNORMAL HIGH (ref 8.7–10.3)
PTH: 55 pg/mL (ref 15–65)

## 2020-06-18 ENCOUNTER — Telehealth: Payer: Self-pay

## 2020-06-18 NOTE — Telephone Encounter (Signed)
-----   Message from Philemon Kingdom, MD sent at 06/14/2020  4:35 PM EDT ----- Erica Navarro, can you please call pt:  TSH is normal.   Calcium is slightly elevated, but stable.  PTH normal but nonsuppressed.  For now, we can continue to follow her expectantly.

## 2020-06-18 NOTE — Telephone Encounter (Signed)
Notified patient of message from Dr. Gherghe, patient expressed understanding and agreement. No further questions.  

## 2020-07-16 DIAGNOSIS — M85851 Other specified disorders of bone density and structure, right thigh: Secondary | ICD-10-CM | POA: Diagnosis not present

## 2020-07-16 DIAGNOSIS — M810 Age-related osteoporosis without current pathological fracture: Secondary | ICD-10-CM | POA: Diagnosis not present

## 2020-07-16 DIAGNOSIS — Z1231 Encounter for screening mammogram for malignant neoplasm of breast: Secondary | ICD-10-CM | POA: Diagnosis not present

## 2020-07-16 LAB — HM MAMMOGRAPHY

## 2020-07-16 LAB — HM DEXA SCAN

## 2020-07-19 ENCOUNTER — Encounter: Payer: Self-pay | Admitting: Family Medicine

## 2020-07-26 ENCOUNTER — Telehealth: Payer: Self-pay | Admitting: Family Medicine

## 2020-07-26 NOTE — Telephone Encounter (Signed)
Received her bone density scan- contacted her endocrinologist  [name],  I just got the report of her bone density. It appears that this is stable at the spine and right femoral neck but lower at the left femoral neck, -2.0 to -2.6. I am not sure this is not due to missing some tablets, but I do agree with you about changing to Prolia. If you can set her up for Prolia, that would be great. I can also have her here for this, if she prefers. Just let me know.  Thank you,  Salena Saner       Previous Messages   ----- Message -----  From: Darreld Mclean, MD  Sent: 07/19/2020 12:33 PM EDT  To: Philemon Kingdom, MD   Maryanna Shape-  I just got this patient's bone density scan back, I think they are sending you a copy as well. Her T score is now -3.1, I believe she has been on Fosamax for at least 4 years.   Do you want me to get her set up for Prolia? It seems like the Fosamax is not getting her to goal   Thank you so much!    I have reached out to my assistant to get her set up for Prolia

## 2020-07-27 NOTE — Telephone Encounter (Signed)
Sent patients information to insurance company. Waiting on approval.

## 2020-08-05 ENCOUNTER — Encounter: Payer: Self-pay | Admitting: Internal Medicine

## 2020-08-05 NOTE — Progress Notes (Signed)
07/16/2020 ([facility])  Lumbar spine (L1-L4) Femoral neck (FN)  T-score - 3.1  RFN: - 1.9 LFN: - 2.6   Previously: 06/17/2018 [facility])  Lumbar spine (L1-L4) Femoral neck (FN)  T-score - 3.1 RFN: - 1.9 LFN: - 2.0   T score decreased only at the level of the left femoral neck.  I would like to obtain the images to see if there was a difference between positioning between the last 2 bone density scans.

## 2020-08-10 ENCOUNTER — Telehealth: Payer: Self-pay

## 2020-08-10 NOTE — Telephone Encounter (Signed)
Called patient, advised that I was working on getting the full report, and if any changes we would let her know.   Patient verbalized understanding, thankful for call back.

## 2020-08-10 NOTE — Telephone Encounter (Signed)
-----   Message from Philemon Kingdom, MD sent at 08/05/2020  5:52 PM EDT ----- Also, Almyra Free, can you please let her know about the following:  The bone density at the level of the spine and the right hip are stable, but there is decreased in her left hip, which has a T score of -2.6 now, down from 2.0.  I would like to obtain the full report so I can review  the images myself to see if there was a difference between left hip positioning between the last 2 bone density scans.  For now, continue Fosamax, and I will be in touch with her if we need to change this.  Ty! C

## 2020-09-03 ENCOUNTER — Encounter: Payer: Self-pay | Admitting: Family Medicine

## 2020-09-03 NOTE — Telephone Encounter (Signed)
Received summary of benefits back. Patients insurance requires [place]. Once approved patient will owe $275 for injection.   Prior Auth initiated via cover my meds. Waiting on determination. Will call to schedule once received.

## 2020-11-28 ENCOUNTER — Other Ambulatory Visit: Payer: Self-pay | Admitting: Family Medicine

## 2020-11-28 DIAGNOSIS — E782 Mixed hyperlipidemia: Secondary | ICD-10-CM

## 2020-11-28 NOTE — Patient Instructions (Incomplete)
It was great to see you again today, I will be in touch with your labs as soon as possible 

## 2020-11-28 NOTE — Progress Notes (Deleted)
[place] at [facility] [address] [place], [place] [address] [phone]  Date:  12/01/2020   Name:  Erica Navarro   DOB:  August 29, 1951   MRN:  [id number]  PCP:  Darreld Mclean, MD    Chief Complaint: No chief complaint on file.   History of Present Illness:  Erica Navarro is a 70 y.o. very pleasant female patient who presents with the following:  Patient today for a periodic follow-up visit- Patient with history of hypertension, hyperparathyroidism, thyroid nodule, hyperlipidemia, osteoporosis Last seen by myself in May 2021  She is seen by Dr. Cruzita Lederer for her primary hyperparathyroidism  COVID-19 vaccine Tetanus vaccine Flu shot Mammogram is up-to-date Colonoscopy up-to-date Bone density up-to-date, September 2021 Cervical cancer screening?  Lab work done in May, calcium level in August  Fosamax Amlodipine Baby aspirin Pravastatin   Patient Active Problem List   Diagnosis Date Noted  . Right thyroid nodule 06/13/2019  . Ventral hernia without obstruction or gangrene 10/30/2016  . Sedentary lifestyle 10/30/2016  . Osteoporosis 06/02/2016  . Vitamin D insufficiency 06/02/2016  . Primary hyperparathyroidism ([place]) 06/04/2015  . HTN (hypertension) 11/11/2012  . High cholesterol 11/11/2012    Past Medical History:  Diagnosis Date  . Hyperlipidemia   . Hypertension   . Hyperthyroidism     Past Surgical History:  Procedure Laterality Date  . COLONOSCOPY  08/10/2019  . PARATHYROIDECTOMY Left 03/28/2019   Procedure: LEFT PARATHYROIDECTOMY;  Surgeon: [name], MD;  Location: [facility];  Service: General;  Laterality: Left;  . POLYPECTOMY      Social History   Tobacco Use  . Smoking status: Never Smoker  . Smokeless tobacco: Never Used  Vaping Use  . Vaping Use: Never used  Substance Use Topics  . Alcohol use: No  . Drug use: No    Family History  Problem Relation Age of Onset  . Colon cancer Neg  Hx   . Colon polyps Neg Hx   . Esophageal cancer Neg Hx   . Rectal cancer Neg Hx   . Stomach cancer Neg Hx     Allergies  Allergen Reactions  . Lisinopril Swelling    hands and face swell    Medication list has been reviewed and updated.  Current Outpatient Medications on File Prior to Visit  Medication Sig Dispense Refill  . alendronate (FOSAMAX) 70 MG tablet Take 1 tablet (70 mg total) by mouth every Tuesday. 12 tablet 4  . amLODipine (NORVASC) 10 MG tablet TAKE 1 TABLET BY MOUTH EVERY DAY 90 tablet 4  . aspirin 81 MG tablet Take 81 mg by mouth daily.    . Cholecalciferol 1000 UNITS capsule Take 1,000 Units by mouth daily.    . pravastatin (PRAVACHOL) 40 MG tablet Take 1 tablet (40 mg total) by mouth at bedtime. 90 tablet 3  . triamcinolone cream (KENALOG) 0.1 % Apply 1 application topically 2 (two) times daily. 120 g 3   No current facility-administered medications on file prior to visit.    Review of Systems:  As per HPI- otherwise negative.   Physical Examination: There were no vitals filed for this visit. There were no vitals filed for this visit. There is no height or weight on file to calculate BMI. Ideal Body Weight:    GEN: no acute distress. HEENT: Atraumatic, Normocephalic.  Ears and Nose: No external deformity. CV: RRR, No M/G/R. No JVD. No thrill. No extra heart sounds. PULM: CTA  B, no wheezes, crackles, rhonchi. No retractions. No resp. distress. No accessory muscle use. ABD: S, NT, ND, +BS. No rebound. No HSM. EXTR: No c/c/e PSYCH: Normally interactive. Conversant.    Assessment and Plan: *** This visit occurred during the SARS-CoV-2 public health emergency.  Safety protocols were in place, including screening questions prior to the visit, additional usage of staff PPE, and extensive cleaning of exam room while observing appropriate contact time as indicated for disinfecting solutions.    Signed Lamar Blinks, MD

## 2020-12-01 ENCOUNTER — Other Ambulatory Visit: Payer: Self-pay

## 2020-12-01 ENCOUNTER — Ambulatory Visit: Payer: Self-pay | Admitting: Family Medicine

## 2020-12-01 DIAGNOSIS — E210 Primary hyperparathyroidism: Secondary | ICD-10-CM

## 2020-12-01 DIAGNOSIS — M818 Other osteoporosis without current pathological fracture: Secondary | ICD-10-CM

## 2020-12-01 DIAGNOSIS — I10 Essential (primary) hypertension: Secondary | ICD-10-CM

## 2020-12-01 DIAGNOSIS — E782 Mixed hyperlipidemia: Secondary | ICD-10-CM

## 2020-12-05 NOTE — Patient Instructions (Incomplete)
It was great to see you again today! I will be in touch with your labs asap  Flu shot today Your muscle aches maybe due to your cholesterol med- pravastatin Stop taking this for a couple of weeks and see if your pain resolves.  If it DOES go away we might want to try a different cholesterol med or a lower dose.  If it DOES NOT go away then you can start taking the medication again.  Please let me know  Please see me in about 6 months assuming all is well

## 2020-12-05 NOTE — Progress Notes (Addendum)
[place] at [facility] [place], [place], [place] [address] [phone]  Date:  12/08/2020   Name:  Erica Navarro   DOB:  [birth year]   MRN:  [id number]  PCP:  Darreld Mclean, MD    Chief Complaint: Hypertension (6 month follow up/), Osteoarthritis, and muscle stiffness   History of Present Illness:  Erica Navarro is a 70 y.o. very pleasant female patient who presents with the following:  Here today for 52-month follow-up visit- history of hypertension, hyperparathyroidism, thyroid nodule, hyperlipidemia, osteoporosis Last seen by myself in May She also sees endocrinology for hyperparathyroidism-most recent visit in August Bone density scan updated in September  COVID-19 vaccine Flu- give today  Tetanus- encouraged her to get booster   Thyroid labs in August, other labs in May   She has noted the muscles in her legs hurting for a month or so-both the calves and thighs can hurt.  She is not aware of any particular cause of this pain, no change in her exercise routine She will use tyelnol which does help  She is on pravastatin but this is not new  Patient Active Problem List   Diagnosis Date Noted  . Right thyroid nodule 06/13/2019  . Ventral hernia without obstruction or gangrene 10/30/2016  . Sedentary lifestyle 10/30/2016  . Osteoporosis 06/02/2016  . Vitamin D insufficiency 06/02/2016  . Primary hyperparathyroidism ([place]) 06/04/2015  . HTN (hypertension) 11/11/2012  . High cholesterol 11/11/2012    Past Medical History:  Diagnosis Date  . Hyperlipidemia   . Hypertension   . Hyperthyroidism     Past Surgical History:  Procedure Laterality Date  . COLONOSCOPY  08/10/2019  . PARATHYROIDECTOMY Left 03/28/2019   Procedure: LEFT PARATHYROIDECTOMY;  Surgeon: [name], MD;  Location: [facility];  Service: General;  Laterality: Left;  . POLYPECTOMY      Social History   Tobacco Use  . Smoking status: Never  Smoker  . Smokeless tobacco: Never Used  Vaping Use  . Vaping Use: Never used  Substance Use Topics  . Alcohol use: No  . Drug use: No    Family History  Problem Relation Age of Onset  . Colon cancer Neg Hx   . Colon polyps Neg Hx   . Esophageal cancer Neg Hx   . Rectal cancer Neg Hx   . Stomach cancer Neg Hx     Allergies  Allergen Reactions  . Lisinopril Swelling    hands and face swell    Medication list has been reviewed and updated.  Current Outpatient Medications on File Prior to Visit  Medication Sig Dispense Refill  . alendronate (FOSAMAX) 70 MG tablet Take 1 tablet (70 mg total) by mouth every Tuesday. 12 tablet 4  . amLODipine (NORVASC) 10 MG tablet TAKE 1 TABLET BY MOUTH EVERY DAY 90 tablet 4  . aspirin 81 MG tablet Take 81 mg by mouth daily.    . Cholecalciferol 1000 UNITS capsule Take 1,000 Units by mouth daily.    . Omega-3 Fatty Acids (FISH OIL) 1000 MG CAPS Take by mouth.    . pravastatin (PRAVACHOL) 40 MG tablet Take 1 tablet (40 mg total) by mouth at bedtime. 90 tablet 1  . triamcinolone cream (KENALOG) 0.1 % Apply 1 application topically 2 (two) times daily. 120 g 3   No current facility-administered medications on file prior to visit.    Review of Systems:  As per HPI- otherwise negative.  Physical Examination: Vitals:   12/08/20 1357 12/08/20 1420  BP: (!) 152/70 133/85  Pulse: 79   Resp: 16   SpO2: 99%    Vitals:   12/08/20 1357  Weight: 168 lb (76.2 kg)  Height: 5\' 3"  (1.6 m)   Body mass index is 29.76 kg/m. Ideal Body Weight: Weight in (lb) to have BMI = 25: 140.8  GEN: no acute distress.  Overweight, looks well HEENT: Atraumatic, Normocephalic.  Ears and Nose: No external deformity. CV: RRR, No M/G/R. No JVD. No thrill. No extra heart sounds. PULM: CTA B, no wheezes, crackles, rhonchi. No retractions. No resp. distress. No accessory muscle use. ABD: S, NT, ND, +BS. No rebound. No HSM. EXTR: No c/c/e PSYCH: Normally  interactive. Conversant.  Leg muscles are not currently tender to palpation   Assessment and Plan: Other osteoporosis without current pathological fracture  Essential hypertension - Plan: CBC, Comprehensive metabolic panel  Mixed hyperlipidemia - Plan: Lipid panel  Primary hyperparathyroidism (HCC)  History of vitamin D deficiency  Elevated glucose level - Plan: Hemoglobin A1c  Muscle pain - Plan: CK  Needs flu shot - Plan: Flu Vaccine QUAD High Dose(Fluad)   Patient following up today Flu shot given Blood pressure under okay control Labs are pending as above She plans to start Prolia soon for osteoporosis, my CMA discussed this with her-she runs our Prolia program Discussed muscle pain, this could be due to statin use.  I have asked her to stop her statin medication for about 2 weeks and see if her symptoms resolve.  She will let me know the result of this trial Also check CK as above Will plan further follow- up pending labs.  This visit occurred during the SARS-CoV-2 public health emergency.  Safety protocols were in place, including screening questions prior to the visit, additional usage of staff PPE, and extensive cleaning of exam room while observing appropriate contact time as indicated for disinfecting solutions.    Signed Lamar Blinks, MD Received her labs as below 2/17. Letter to patient  Results for orders placed or performed in visit on 12/08/20  CBC  Result Value Ref Range   WBC 5.1 4.0 - 10.5 K/uL   RBC 4.53 3.87 - 5.11 Mil/uL   Platelets 299.0 150.0 - 400.0 K/uL   Hemoglobin 13.0 12.0 - 15.0 g/dL   HCT 39.0 36.0 - 46.0 %   MCV 86.0 78.0 - 100.0 fl   MCHC 33.3 30.0 - 36.0 g/dL   RDW 14.8 11.5 - 15.5 %  Comprehensive metabolic panel  Result Value Ref Range   Sodium 136 135 - 145 mEq/L   Potassium 4.6 3.5 - 5.1 mEq/L   Chloride 100 96 - 112 mEq/L   CO2 29 19 - 32 mEq/L   Glucose, Bld 89 70 - 99 mg/dL   BUN 15 6 - 23 mg/dL   Creatinine, Ser 0.60  0.40 - 1.20 mg/dL   Total Bilirubin 0.5 0.2 - 1.2 mg/dL   Alkaline Phosphatase 78 39 - 117 U/L   AST 17 0 - 37 U/L   ALT 13 0 - 35 U/L   Total Protein 7.8 6.0 - 8.3 g/dL   Albumin 4.4 3.5 - 5.2 g/dL   GFR 90.39 >60.00 mL/min   Calcium 11.5 (H) 8.4 - 10.5 mg/dL  Hemoglobin A1c  Result Value Ref Range   Hgb A1c MFr Bld 5.4 4.6 - 6.5 %  Lipid panel  Result Value Ref Range   Cholesterol 218 (H) 0 -  200 mg/dL   Triglycerides 61.0 0.0 - 149.0 mg/dL   HDL 71.10 >39.00 mg/dL   VLDL 12.2 0.0 - 40.0 mg/dL   LDL Cholesterol 134 (H) 0 - 99 mg/dL   Total CHOL/HDL Ratio 3    NonHDL 146.49   CK  Result Value Ref Range   Total CK 104 7 - 177 U/L

## 2020-12-08 ENCOUNTER — Other Ambulatory Visit: Payer: Self-pay

## 2020-12-08 ENCOUNTER — Encounter: Payer: Self-pay | Admitting: Family Medicine

## 2020-12-08 ENCOUNTER — Ambulatory Visit (INDEPENDENT_AMBULATORY_CARE_PROVIDER_SITE_OTHER): Payer: Medicare Other | Admitting: Family Medicine

## 2020-12-08 VITALS — BP 133/85 | HR 79 | Resp 16 | Ht 63.0 in | Wt 168.0 lb

## 2020-12-08 DIAGNOSIS — M791 Myalgia, unspecified site: Secondary | ICD-10-CM

## 2020-12-08 DIAGNOSIS — E210 Primary hyperparathyroidism: Secondary | ICD-10-CM

## 2020-12-08 DIAGNOSIS — M818 Other osteoporosis without current pathological fracture: Secondary | ICD-10-CM

## 2020-12-08 DIAGNOSIS — E782 Mixed hyperlipidemia: Secondary | ICD-10-CM

## 2020-12-08 DIAGNOSIS — R7309 Other abnormal glucose: Secondary | ICD-10-CM

## 2020-12-08 DIAGNOSIS — Z23 Encounter for immunization: Secondary | ICD-10-CM | POA: Diagnosis not present

## 2020-12-08 DIAGNOSIS — I10 Essential (primary) hypertension: Secondary | ICD-10-CM | POA: Diagnosis not present

## 2020-12-08 DIAGNOSIS — Z8639 Personal history of other endocrine, nutritional and metabolic disease: Secondary | ICD-10-CM

## 2020-12-09 LAB — CBC
HCT: 39 % (ref 36.0–46.0)
Hemoglobin: 13 g/dL (ref 12.0–15.0)
MCHC: 33.3 g/dL (ref 30.0–36.0)
MCV: 86 fl (ref 78.0–100.0)
Platelets: 299 10*3/uL (ref 150.0–400.0)
RBC: 4.53 Mil/uL (ref 3.87–5.11)
RDW: 14.8 % (ref 11.5–15.5)
WBC: 5.1 10*3/uL (ref 4.0–10.5)

## 2020-12-09 LAB — LIPID PANEL
Cholesterol: 218 mg/dL — ABNORMAL HIGH (ref 0–200)
HDL: 71.1 mg/dL (ref >39.00)
LDL Cholesterol: 134 mg/dL — ABNORMAL HIGH (ref 0–99)
NonHDL: 146.49
Total CHOL/HDL Ratio: 3
Triglycerides: 61 mg/dL (ref 0.0–149.0)
VLDL: 12.2 mg/dL (ref 0.0–40.0)

## 2020-12-09 LAB — COMPREHENSIVE METABOLIC PANEL WITH GFR
ALT: 13 U/L (ref 0–35)
AST: 17 U/L (ref 0–37)
Albumin: 4.4 g/dL (ref 3.5–5.2)
Alkaline Phosphatase: 78 U/L (ref 39–117)
BUN: 15 mg/dL (ref 6–23)
CO2: 29 meq/L (ref 19–32)
Calcium: 11.5 mg/dL — ABNORMAL HIGH (ref 8.4–10.5)
Chloride: 100 meq/L (ref 96–112)
Creatinine, Ser: 0.6 mg/dL (ref 0.40–1.20)
GFR: 90.39 mL/min
Glucose, Bld: 89 mg/dL (ref 70–99)
Potassium: 4.6 meq/L (ref 3.5–5.1)
Sodium: 136 meq/L (ref 135–145)
Total Bilirubin: 0.5 mg/dL (ref 0.2–1.2)
Total Protein: 7.8 g/dL (ref 6.0–8.3)

## 2020-12-09 LAB — HEMOGLOBIN A1C: Hgb A1c MFr Bld: 5.4 % (ref 4.6–6.5)

## 2020-12-09 LAB — CK: Total CK: 104 U/L (ref 7–177)

## 2020-12-28 ENCOUNTER — Telehealth: Payer: Self-pay | Admitting: Family Medicine

## 2020-12-28 NOTE — Telephone Encounter (Signed)
Called pt back-asked her to please try taking 20 mg of pravastatin every other day and see if she can tolerate this lower amount.  If aches return stop taking completely

## 2020-12-28 NOTE — Telephone Encounter (Signed)
Caller Erica Navarro  Call Back @ [phone]  Patient states Dr Lorelei Pont took her off of cholesterol medication b/c she was aching.  Patient states feeling better since that.

## 2021-01-17 ENCOUNTER — Telehealth: Payer: Self-pay | Admitting: Family Medicine

## 2021-01-17 DIAGNOSIS — E782 Mixed hyperlipidemia: Secondary | ICD-10-CM

## 2021-01-17 MED ORDER — PRAVASTATIN SODIUM 40 MG PO TABS: 40.0000 mg | ORAL_TABLET | Freq: Every day | ORAL | 1 refills | Status: DC

## 2021-01-17 NOTE — Telephone Encounter (Signed)
New pharmacy Medication: pravastatin (PRAVACHOL) 40 MG tablet [[id number]]       Has the patient contacted their pharmacy?  (If no, request that the patient contact the pharmacy for the refill.) (If yes, when and what did the pharmacy advise?)     Preferred Pharmacy (with phone number or street name):  [facility] [place], [place] - [address] Phone:  [phone]  Fax:  [phone]          Agent: Please be advised that RX refills may take up to 3 business days. We ask that you follow-up with your pharmacy.

## 2021-01-17 NOTE — Telephone Encounter (Signed)
Medication refilled to pharmacy.  

## 2021-02-25 ENCOUNTER — Other Ambulatory Visit: Payer: Self-pay | Admitting: Family Medicine

## 2021-02-25 DIAGNOSIS — I10 Essential (primary) hypertension: Secondary | ICD-10-CM

## 2021-04-15 ENCOUNTER — Ambulatory Visit: Payer: Medicare (Managed Care)

## 2021-04-15 NOTE — Progress Notes (Deleted)
Subjective:   Erica Navarro is a 70 y.o. female who presents for Medicare Annual (Subsequent) preventive examination.  I connected with Erica Navarro today by telephone and verified that I am speaking with the correct person using two identifiers. Location patient: home Location provider: work Persons participating in the virtual visit: patient, Marine scientist.    I discussed the limitations, risks, security and privacy concerns of performing an evaluation and management service by telephone and the availability of in person appointments. I also discussed with the patient that there may be a patient responsible charge related to this service. The patient expressed understanding and verbally consented to this telephonic visit.    Interactive audio and video telecommunications were attempted between this provider and patient, however failed, due to patient having technical difficulties OR patient did not have access to video capability.  We continued and completed visit with audio only.  Some vital signs may be absent or patient reported.   Time Spent with patient on telephone encounter: *** minutes   Review of Systems          Objective:    There were no vitals filed for this visit. There is no height or weight on file to calculate BMI.  Advanced Directives 03/28/2019 03/21/2019 01/07/2019 04/30/2017 11/26/2016  Does Patient Have a Medical Advance Directive? No No No No No  Would patient like information on creating a medical advance directive? No - Patient declined Yes (MAU/Ambulatory/Procedural Areas - Information given) No - Patient declined No - Patient declined -    Current Medications (verified) Outpatient Encounter Medications as of 04/15/2021  Medication Sig   alendronate (FOSAMAX) 70 MG tablet Take 1 tablet (70 mg total) by mouth every Tuesday.   amLODipine (NORVASC) 10 MG tablet TAKE 1 TABLET BY MOUTH EVERY DAY   aspirin 81 MG tablet Take 81 mg by mouth daily.   Cholecalciferol 1000 UNITS  capsule Take 1,000 Units by mouth daily.   Omega-3 Fatty Acids (FISH OIL) 1000 MG CAPS Take by mouth.   pravastatin (PRAVACHOL) 40 MG tablet Take 1 tablet (40 mg total) by mouth at bedtime.   triamcinolone cream (KENALOG) 0.1 % Apply 1 application topically 2 (two) times daily.   No facility-administered encounter medications on file as of 04/15/2021.    Allergies (verified) Lisinopril   History: Past Medical History:  Diagnosis Date   Hyperlipidemia    Hypertension    Hyperthyroidism    Past Surgical History:  Procedure Laterality Date   COLONOSCOPY  08/10/2019   PARATHYROIDECTOMY Left 03/28/2019   Procedure: LEFT PARATHYROIDECTOMY;  Surgeon: [name], MD;  Location: [facility];  Service: General;  Laterality: Left;   POLYPECTOMY     Family History  Problem Relation Age of Onset   Colon cancer Neg Hx    Colon polyps Neg Hx    Esophageal cancer Neg Hx    Rectal cancer Neg Hx    Stomach cancer Neg Hx    Social History   Socioeconomic History   Marital status: Single    Spouse name: Not on file   Number of children: Not on file   Years of education: Not on file   Highest education level: Not on file  Occupational History   Not on file  Tobacco Use   Smoking status: Never   Smokeless tobacco: Never  Vaping Use   Vaping Use: Never used  Substance and Sexual Activity   Alcohol use: No   Drug use: No   Sexual activity: Never  Birth control/protection: Abstinence  Other Topics Concern   Not on file  Social History Narrative   Not on file   Social Determinants of Health   Financial Resource Strain: Not on file  Food Insecurity: Not on file  Transportation Needs: Not on file  Physical Activity: Not on file  Stress: Not on file  Social Connections: Not on file    Tobacco Counseling Counseling given: Not Answered   Clinical Intake:                 Diabetic?No         Activities of Daily Living No flowsheet data found.  Patient Care  Team: Copland, Gay Filler, MD as PCP - General (Family Medicine) Philemon Kingdom, MD as Consulting Physician (Internal Medicine)  Indicate any recent Medical Services you may have received from other than [facility] providers in the past year (date may be approximate).     Assessment:   This is a routine wellness examination for Erica Navarro.  Hearing/Vision screen No results found.  Dietary issues and exercise activities discussed:     Goals Addressed   None    Depression Screen PHQ 2/9 Scores 04/30/2017 11/29/2016 11/17/2015 05/17/2015  PHQ - 2 Score 0 0 0 0    Fall Risk Fall Risk  12/08/2020 04/30/2017 11/29/2016 11/17/2015 05/17/2015  Falls in the past year? 0 Yes No No No  Number falls in past yr: 0 1 - - -  Injury with Fall? 0 No - - -  Follow up - Education provided;Falls prevention discussed - - -    FALL RISK PREVENTION PERTAINING TO THE HOME:  Any stairs in or around the home? {YES/NO:21197} If so, are there any without handrails? {YES/NO:21197} Home free of loose throw rugs in walkways, pet beds, electrical cords, etc? {YES/NO:21197} Adequate lighting in your home to reduce risk of falls? {YES/NO:21197}  ASSISTIVE DEVICES UTILIZED TO PREVENT FALLS:  Life alert? {YES/NO:21197} Use of a cane, walker or w/c? {YES/NO:21197} Grab bars in the bathroom? {YES/NO:21197} Shower chair or bench in shower? {YES/NO:21197} Elevated toilet seat or a handicapped toilet? {YES/NO:21197}  TIMED UP AND GO:  Was the test performed? {YES/NO:21197}.  Length of time to ambulate 10 feet:  sec.   {Appearance of Gait:2101803}  Cognitive Function: MMSE - Mini Mental State Exam 04/30/2017  Orientation to time 5  Orientation to Place 5  Registration 3  Attention/ Calculation 5  Recall 3  Language- name 2 objects 2  Language- repeat 1  Language- follow 3 step command 3  Language- read & follow direction 1  Write a sentence 1  Copy design 1  Total score 30        Immunizations Immunization  History  Administered Date(s) Administered   Fluad Quad(high Dose 65+) 12/08/2020   Influenza, High Dose Seasonal PF 12/02/2018   PFIZER Comirnaty(Gray Top)Covid-19 Tri-Sucrose Vaccine 04/24/2020, 05/22/2020, 11/27/2020   Pneumococcal Conjugate-13 05/17/2015   Pneumococcal Polysaccharide-23 10/30/2016   Zoster, Live 10/30/2016    TDAP status: Due, Education has been provided regarding the importance of this vaccine. Advised may receive this vaccine at local pharmacy or Health Dept. Aware to provide a copy of the vaccination record if obtained from local pharmacy or Health Dept. Verbalized acceptance and understanding.  Flu Vaccine status: Up to date  Pneumococcal vaccine status: Up to date  Covid-19 vaccine status: Completed vaccines  Qualifies for Shingles Vaccine? Yes   Zostavax completed Yes   Shingrix Completed?: No.    Education has been provided  regarding the importance of this vaccine. Patient has been advised to call insurance company to determine out of pocket expense if they have not yet received this vaccine. Advised may also receive vaccine at local pharmacy or Health Dept. Verbalized acceptance and understanding.  Screening Tests Health Maintenance  Topic Date Due   TETANUS/TDAP  Never done   Zoster Vaccines- Shingrix (1 of 2) Never done   COVID-19 Vaccine (4 - Booster for Pfizer series) 03/27/2021   INFLUENZA VACCINE  05/23/2021   MAMMOGRAM  07/16/2022   COLONOSCOPY (Pts 45-41yrs Insurance coverage will need to be confirmed)  07/20/2029   DEXA SCAN  Completed   Hepatitis C Screening  Completed   PNA vac Low Risk Adult  Completed   HPV VACCINES  Aged Out    Health Maintenance  Health Maintenance Due  Topic Date Due   TETANUS/TDAP  Never done   Zoster Vaccines- Shingrix (1 of 2) Never done   COVID-19 Vaccine (4 - Booster for Pfizer series) 03/27/2021    Colorectal cancer screening: Type of screening: Colonoscopy. Completed 07/21/2019. Repeat every 7  years  Mammogram status: Completed Bilateral 07/16/2020. Repeat every year  Bone Density status: Completed 07/16/2020. Results reflect: Bone density results: OSTEOPOROSIS. Repeat every 2 years.  Lung Cancer Screening: (Low Dose CT Chest recommended if Age 51-80 years, 30 pack-year currently smoking OR have quit w/in 15years.) does not qualify.     Additional Screening:  Hepatitis C Screening: Completed 11/17/2015  Vision Screening: Recommended annual ophthalmology exams for early detection of glaucoma and other disorders of the eye. Is the patient up to date with their annual eye exam?  {YES/NO:21197} Who is the provider or what is the name of the office in which the patient attends annual eye exams? *** If pt is not established with a provider, would they like to be referred to a provider to establish care? {YES/NO:21197}.   Dental Screening: Recommended annual dental exams for proper oral hygiene  Community Resource Referral / Chronic Care Management: CRR required this visit?  {YES/NO:21197}  CCM required this visit?  {YES/NO:21197}     Plan:     I have personally reviewed and noted the following in the patient's chart:   Medical and social history Use of alcohol, tobacco or illicit drugs  Current medications and supplements including opioid prescriptions.  Functional ability and status Nutritional status Physical activity Advanced directives List of other physicians Hospitalizations, surgeries, and ER visits in previous 12 months Vitals Screenings to include cognitive, depression, and falls Referrals and appointments  In addition, I have reviewed and discussed with patient certain preventive protocols, quality metrics, and best practice recommendations. A written personalized care plan for preventive services as well as general preventive health recommendations were provided to patient.   Due to this being a telephonic visit, the after visit summary with patients  personalized plan was offered to patient via mail or my-chart. ***Patient declined at this time./ Patient would like to access on my-chart/ per request, patient was mailed a copy of AVS./ Patient preferred to pick up at office at next visit.   Marta Antu, LPN   [birth year]/03/1976  Nurse Health Advisor  Nurse Notes: ***

## 2021-04-19 NOTE — Progress Notes (Addendum)
Subjective:   Erica Navarro is a 70 y.o. female who presents for Medicare Annual (Subsequent) preventive examination.   Review of Systems     Cardiac Risk Factors include: advanced age (>61men, >59 women);hypertension;dyslipidemia;obesity (BMI >30kg/m2)     Objective:    Today's Vitals   04/20/21 1251  BP: (!) 158/90  Pulse: 76  Resp: 16  Temp: (!) 97.5 F (36.4 C)  TempSrc: Temporal  SpO2: 99%  Weight: 170 lb 3.2 oz (77.2 kg)  Height: 5\' 3"  (1.6 m)   Body mass index is 30.15 kg/m.  Advanced Directives 04/20/2021 03/28/2019 03/21/2019 01/07/2019 04/30/2017 11/26/2016  Does Patient Have a Medical Advance Directive? No No No No No No  Would patient like information on creating a medical advance directive? Yes (MAU/Ambulatory/Procedural Areas - Information given) No - Patient declined Yes (MAU/Ambulatory/Procedural Areas - Information given) No - Patient declined No - Patient declined -    Current Medications (verified) Outpatient Encounter Medications as of 04/20/2021  Medication Sig   alendronate (FOSAMAX) 70 MG tablet Take 1 tablet (70 mg total) by mouth every Tuesday.   amLODipine (NORVASC) 10 MG tablet TAKE 1 TABLET BY MOUTH EVERY DAY   aspirin 81 MG tablet Take 81 mg by mouth daily.   Cholecalciferol 1000 UNITS capsule Take 1,000 Units by mouth daily.   Omega-3 Fatty Acids (FISH OIL) 1000 MG CAPS Take by mouth.   pravastatin (PRAVACHOL) 40 MG tablet Take 1 tablet (40 mg total) by mouth at bedtime.   triamcinolone cream (KENALOG) 0.1 % Apply 1 application topically 2 (two) times daily.   No facility-administered encounter medications on file as of 04/20/2021.    Allergies (verified) Lisinopril   History: Past Medical History:  Diagnosis Date   Hyperlipidemia    Hypertension    Hyperthyroidism    Past Surgical History:  Procedure Laterality Date   COLONOSCOPY  08/10/2019   PARATHYROIDECTOMY Left 03/28/2019   Procedure: LEFT PARATHYROIDECTOMY;  Surgeon: [name],  MD;  Location: [facility];  Service: General;  Laterality: Left;   POLYPECTOMY     Family History  Problem Relation Age of Onset   Colon cancer Neg Hx    Colon polyps Neg Hx    Esophageal cancer Neg Hx    Rectal cancer Neg Hx    Stomach cancer Neg Hx    Social History   Socioeconomic History   Marital status: Single    Spouse name: Not on file   Number of children: Not on file   Years of education: Not on file   Highest education level: Not on file  Occupational History   Not on file  Tobacco Use   Smoking status: Never   Smokeless tobacco: Never  Vaping Use   Vaping Use: Never used  Substance and Sexual Activity   Alcohol use: No   Drug use: No   Sexual activity: Never    Birth control/protection: Abstinence  Other Topics Concern   Not on file  Social History Narrative   Not on file   Social Determinants of Health   Financial Resource Strain: Low Risk    Difficulty of Paying Living Expenses: Not hard at all  Food Insecurity: No Food Insecurity   Worried About Charity fundraiser in the Last Year: Never true   Ran Out of Food in the Last Year: Never true  Transportation Needs: No Transportation Needs   Lack of Transportation (Medical): No   Lack of Transportation (Non-Medical): No  Physical Activity: Inactive  Days of Exercise per Week: 0 days   Minutes of Exercise per Session: 0 min  Stress: No Stress Concern Present   Feeling of Stress : Not at all  Social Connections: Moderately Integrated   Frequency of Communication with Friends and Family: More than three times a week   Frequency of Social Gatherings with Friends and Family: More than three times a week   Attends Religious Services: More than 4 times per year   Active Member of [employer] or Organizations: No   Attends Music therapist: Never   Marital Status: Married    Tobacco Counseling Counseling given: Not Answered   Clinical Intake:  Pre-visit preparation completed: Yes  Pain :  No/denies pain     Nutritional Status: BMI > 30  Obese Nutritional Risks: None Diabetes: No  How often do you need to have someone help you when you read instructions, pamphlets, or other written materials from your doctor or pharmacy?: 1 - Never  Diabetic?No  Interpreter Needed?: No  Information entered by :: Caroleen Hamman LPN   Activities of Daily Living In your present state of health, do you have any difficulty performing the following activities: 04/20/2021  Hearing? N  Vision? N  Difficulty concentrating or making decisions? N  Walking or climbing stairs? N  Dressing or bathing? N  Doing errands, shopping? N  Preparing Food and eating ? N  Using the Toilet? N  In the past six months, have you accidently leaked urine? N  Do you have problems with loss of bowel control? N  Managing your Medications? N  Managing your Finances? N  Housekeeping or managing your Housekeeping? N  Some recent data might be hidden    Patient Care Team: Copland, Gay Filler, MD as PCP - General (Family Medicine) Philemon Kingdom, MD as Consulting Physician (Internal Medicine)  Indicate any recent Medical Services you may have received from other than [facility] providers in the past year (date may be approximate).     Assessment:   This is a routine wellness examination for Erica Navarro.  Hearing/Vision screen Hearing Screening - Comments:: No issues Vision Screening - Comments:: Reading glasses Last eye exam-never  Dietary issues and exercise activities discussed: Current Exercise Habits: The patient does not participate in regular exercise at present, Exercise limited by: None identified   Goals Addressed             This Visit's Progress    Patient Stated   On track    Maintain healthy lifestyle        Depression Screen PHQ 2/9 Scores 04/20/2021 04/30/2017 11/29/2016 11/17/2015 05/17/2015  PHQ - 2 Score 0 0 0 0 0    Fall Risk Fall Risk  04/20/2021 12/08/2020 04/30/2017 11/29/2016 11/17/2015   Falls in the past year? 0 0 Yes No No  Number falls in past yr: 0 0 1 - -  Injury with Fall? 0 0 No - -  Follow up Falls prevention discussed - Education provided;Falls prevention discussed - -    FALL RISK PREVENTION PERTAINING TO THE HOME:  Any stairs in or around the home? No  Home free of loose throw rugs in walkways, pet beds, electrical cords, etc? Yes  Adequate lighting in your home to reduce risk of falls? Yes   ASSISTIVE DEVICES UTILIZED TO PREVENT FALLS:  Life alert? No  Use of a cane, walker or w/c? No  Grab bars in the bathroom? No  Shower chair or bench in shower? No  Elevated  toilet seat or a handicapped toilet? No   TIMED UP AND GO:  Was the test performed? Yes .  Length of time to ambulate 10 feet: 10 sec.   Gait steady and fast without use of assistive device  Cognitive Function:Normal cognitive status assessed by direct observation by this Nurse Health Advisor. No abnormalities found.   MMSE - Mini Mental State Exam 04/30/2017  Orientation to time 5  Orientation to Place 5  Registration 3  Attention/ Calculation 5  Recall 3  Language- name 2 objects 2  Language- repeat 1  Language- follow 3 step command 3  Language- read & follow direction 1  Write a sentence 1  Copy design 1  Total score 30        Immunizations Immunization History  Administered Date(s) Administered   Fluad Quad(high Dose 65+) 12/08/2020   Influenza, High Dose Seasonal PF 12/02/2018   PFIZER Comirnaty(Gray Top)Covid-19 Tri-Sucrose Vaccine 04/24/2020, 05/22/2020, 11/27/2020   Pneumococcal Conjugate-13 05/17/2015   Pneumococcal Polysaccharide-23 10/30/2016   Zoster, Live 10/30/2016    TDAP status: Due, Education has been provided regarding the importance of this vaccine. Advised may receive this vaccine at local pharmacy or Health Dept. Aware to provide a copy of the vaccination record if obtained from local pharmacy or Health Dept. Verbalized acceptance and  understanding.  Flu Vaccine status: Up to date  Pneumococcal vaccine status: Up to date  Covid-19 vaccine status: Completed vaccines  Qualifies for Shingles Vaccine? Yes   Zostavax completed Yes   Shingrix Completed?: No.    Education has been provided regarding the importance of this vaccine. Patient has been advised to call insurance company to determine out of pocket expense if they have not yet received this vaccine. Advised may also receive vaccine at local pharmacy or Health Dept. Verbalized acceptance and understanding.  Screening Tests Health Maintenance  Topic Date Due   TETANUS/TDAP  Never done   Zoster Vaccines- Shingrix (1 of 2) Never done   COVID-19 Vaccine (4 - Booster for Pfizer series) 03/27/2021   INFLUENZA VACCINE  05/23/2021   MAMMOGRAM  07/16/2022   COLONOSCOPY (Pts 45-24yrs Insurance coverage will need to be confirmed)  07/20/2029   DEXA SCAN  Completed   Hepatitis C Screening  Completed   PNA vac Low Risk Adult  Completed   HPV VACCINES  Aged Out    Health Maintenance  Health Maintenance Due  Topic Date Due   TETANUS/TDAP  Never done   Zoster Vaccines- Shingrix (1 of 2) Never done   COVID-19 Vaccine (4 - Booster for Pfizer series) 03/27/2021    Colorectal cancer screening: Type of screening: Colonoscopy. Completed 07/21/2019. Repeat every 7 years  Mammogram status: Completed Bilateral 07/16/2020. Repeat every year  Bone Density status: Completed 07/16/2020. Results reflect: Bone density results: OSTEOPOROSIS. Repeat every 2 years.  Lung Cancer Screening: (Low Dose CT Chest recommended if Age 46-80 years, 30 pack-year currently smoking OR have quit w/in 15years.) does not qualify.     Additional Screening:  Hepatitis C Screening:  Completed 11/17/2015  Vision Screening: Recommended annual ophthalmology exams for early detection of glaucoma and other disorders of the eye. Is the patient up to date with their annual eye exam?  No  Who is the provider  or what is the name of the office in which the patient attends annual eye exams? none Patient plans to schedule her own sppt  Dental Screening: Recommended annual dental exams for proper oral hygiene  Community Resource Referral / Chronic  Care Management: CRR required this visit?  No   CCM required this visit?  No      Plan:     I have personally reviewed and noted the following in the patient's chart:   Medical and social history Use of alcohol, tobacco or illicit drugs  Current medications and supplements including opioid prescriptions.  Functional ability and status Nutritional status Physical activity Advanced directives List of other physicians Hospitalizations, surgeries, and ER visits in previous 12 months Vitals Screenings to include cognitive, depression, and falls Referrals and appointments  In addition, I have reviewed and discussed with patient certain preventive protocols, quality metrics, and best practice recommendations. A written personalized care plan for preventive services as well as general preventive health recommendations were provided to patient.      Marta Antu, LPN   4/80/1655  Nurse Health Advisor  Nurse Notes: None  Medical screening examination/treatment/procedure(s) were performed by non-physician practitioner and as supervising provider I was immediately available for consultation/collaboration.  I agree with above. Marrian Salvage, FNP

## 2021-04-20 ENCOUNTER — Other Ambulatory Visit: Payer: Self-pay

## 2021-04-20 ENCOUNTER — Ambulatory Visit (INDEPENDENT_AMBULATORY_CARE_PROVIDER_SITE_OTHER): Payer: Medicare (Managed Care)

## 2021-04-20 VITALS — BP 158/90 | HR 76 | Temp 97.5°F | Resp 16 | Ht 63.0 in | Wt 170.2 lb

## 2021-04-20 DIAGNOSIS — Z0000 Encounter for general adult medical examination without abnormal findings: Secondary | ICD-10-CM

## 2021-04-20 NOTE — Patient Instructions (Signed)
Erica Navarro , Thank you for taking time to come for your Medicare Wellness Visit. I appreciate your ongoing commitment to your health goals. Please review the following plan we discussed and let me know if I can assist you in the future.   Screening recommendations/referrals: Colonoscopy: Completed 07/21/2019-Due 07/20/2026 Mammogram: Completed 07/16/2020-Due 07/16/2021 Bone Density:  Completed 07/16/2020-Due 07/16/2022 Recommended yearly ophthalmology/optometry visit for glaucoma screening and checkup Recommended yearly dental visit for hygiene and checkup  Vaccinations: Influenza vaccine: Up to date Pneumococcal vaccine:  Up to date Tdap vaccine: Discuss with pharmacy Shingles vaccine: Discuss with pharmacy   Covid-19: Up to date  Advanced directives: Information given today  Conditions/risks identified: See problem list  Next appointment: Follow up in one year for your annual wellness visit 05/02/2022 @ 1:00   Preventive Care 65 Years and Older, Female Preventive care refers to lifestyle choices and visits with your health care provider that can promote health and wellness. What does preventive care include? A yearly physical exam. This is also called an annual well check. Dental exams once or twice a year. Routine eye exams. Ask your health care provider how often you should have your eyes checked. Personal lifestyle choices, including: Daily care of your teeth and gums. Regular physical activity. Eating a healthy diet. Avoiding tobacco and drug use. Limiting alcohol use. Practicing safe sex. Taking low-dose aspirin every day. Taking vitamin and mineral supplements as recommended by your health care provider. What happens during an annual well check? The services and screenings done by your health care provider during your annual well check will depend on your age, overall health, lifestyle risk factors, and family history of disease. Counseling  Your health care provider may  ask you questions about your: Alcohol use. Tobacco use. Drug use. Emotional well-being. Home and relationship well-being. Sexual activity. Eating habits. History of falls. Memory and ability to understand (cognition). Work and work Statistician. Reproductive health. Screening  You may have the following tests or measurements: Height, weight, and BMI. Blood pressure. Lipid and cholesterol levels. These may be checked every 5 years, or more frequently if you are over 47 years old. Skin check. Lung cancer screening. You may have this screening every year starting at age 32 if you have a 30-pack-year history of smoking and currently smoke or have quit within the past 15 years. Fecal occult blood test (FOBT) of the stool. You may have this test every year starting at age 6. Flexible sigmoidoscopy or colonoscopy. You may have a sigmoidoscopy every 5 years or a colonoscopy every 10 years starting at age 80. Hepatitis C blood test. Hepatitis B blood test. Sexually transmitted disease (STD) testing. Diabetes screening. This is done by checking your blood sugar (glucose) after you have not eaten for a while (fasting). You may have this done every 1-3 years. Bone density scan. This is done to screen for osteoporosis. You may have this done starting at age 62. Mammogram. This may be done every 1-2 years. Talk to your health care provider about how often you should have regular mammograms. Talk with your health care provider about your test results, treatment options, and if necessary, the need for more tests. Vaccines  Your health care provider may recommend certain vaccines, such as: Influenza vaccine. This is recommended every year. Tetanus, diphtheria, and acellular pertussis (Tdap, Td) vaccine. You may need a Td booster every 10 years. Zoster vaccine. You may need this after age 44. Pneumococcal 13-valent conjugate (PCV13) vaccine. One dose is recommended after  age 20. Pneumococcal  polysaccharide (PPSV23) vaccine. One dose is recommended after age 7. Talk to your health care provider about which screenings and vaccines you need and how often you need them. This information is not intended to replace advice given to you by your health care provider. Make sure you discuss any questions you have with your health care provider. Document Released: 11/05/2015 Document Revised: 06/28/2016 Document Reviewed: 08/10/2015 Elsevier Interactive Patient Education  2017 [employer] Prevention in the Home Falls can cause injuries. They can happen to people of all ages. There are many things you can do to make your home safe and to help prevent falls. What can I do on the outside of my home? Regularly fix the edges of walkways and driveways and fix any cracks. Remove anything that might make you trip as you walk through a door, such as a raised step or threshold. Trim any bushes or trees on the path to your home. Use bright outdoor lighting. Clear any walking paths of anything that might make someone trip, such as rocks or tools. Regularly check to see if handrails are loose or broken. Make sure that both sides of any steps have handrails. Any raised decks and porches should have guardrails on the edges. Have any leaves, snow, or ice cleared regularly. Use sand or salt on walking paths during winter. Clean up any spills in your garage right away. This includes oil or grease spills. What can I do in the bathroom? Use night lights. Install grab bars by the toilet and in the tub and shower. Do not use towel bars as grab bars. Use non-skid mats or decals in the tub or shower. If you need to sit down in the shower, use a plastic, non-slip stool. Keep the floor dry. Clean up any water that spills on the floor as soon as it happens. Remove soap buildup in the tub or shower regularly. Attach bath mats securely with double-sided non-slip rug tape. Do not have throw rugs and other  things on the floor that can make you trip. What can I do in the bedroom? Use night lights. Make sure that you have a light by your bed that is easy to reach. Do not use any sheets or blankets that are too big for your bed. They should not hang down onto the floor. Have a firm chair that has side arms. You can use this for support while you get dressed. Do not have throw rugs and other things on the floor that can make you trip. What can I do in the kitchen? Clean up any spills right away. Avoid walking on wet floors. Keep items that you use a lot in easy-to-reach places. If you need to reach something above you, use a strong step stool that has a grab bar. Keep electrical cords out of the way. Do not use floor polish or wax that makes floors slippery. If you must use wax, use non-skid floor wax. Do not have throw rugs and other things on the floor that can make you trip. What can I do with my stairs? Do not leave any items on the stairs. Make sure that there are handrails on both sides of the stairs and use them. Fix handrails that are broken or loose. Make sure that handrails are as long as the stairways. Check any carpeting to make sure that it is firmly attached to the stairs. Fix any carpet that is loose or worn. Avoid having throw rugs  at the top or bottom of the stairs. If you do have throw rugs, attach them to the floor with carpet tape. Make sure that you have a light switch at the top of the stairs and the bottom of the stairs. If you do not have them, ask someone to add them for you. What else can I do to help prevent falls? Wear shoes that: Do not have high heels. Have rubber bottoms. Are comfortable and fit you well. Are closed at the toe. Do not wear sandals. If you use a stepladder: Make sure that it is fully opened. Do not climb a closed stepladder. Make sure that both sides of the stepladder are locked into place. Ask someone to hold it for you, if possible. Clearly  mark and make sure that you can see: Any grab bars or handrails. First and last steps. Where the edge of each step is. Use tools that help you move around (mobility aids) if they are needed. These include: Canes. Walkers. Scooters. Crutches. Turn on the lights when you go into a dark area. Replace any light bulbs as soon as they burn out. Set up your furniture so you have a clear path. Avoid moving your furniture around. If any of your floors are uneven, fix them. If there are any pets around you, be aware of where they are. Review your medicines with your doctor. Some medicines can make you feel dizzy. This can increase your chance of falling. Ask your doctor what other things that you can do to help prevent falls. This information is not intended to replace advice given to you by your health care provider. Make sure you discuss any questions you have with your health care provider. Document Released: 08/05/2009 Document Revised: 03/16/2016 Document Reviewed: 11/13/2014 Elsevier Interactive Patient Education  2017 [employer].

## 2021-06-08 NOTE — Progress Notes (Addendum)
[facility] [address], [place], [place] [address] [phone]  Date:  06/13/2021   Name:  Grant Mazzola   DOB:  Dec 25, 1950   MRN:  [id number]  PCP:  Darreld Mclean, MD    Chief Complaint: Hypertension (6 month follow up/)   History of Present Illness:  Erica Navarro is a 70 y.o. very pleasant female patient who presents with the following:  Patient seen today for 10-monthfollow-up visit- history of hypertension, hyperparathyroidism, thyroid nodule, hyperlipidemia, osteoporosis Last seen by myself in February She is followed by endocrinology for hyperparathyroidism-status post parathyroidectomy in 2020  ?  Last endocrinology follow-up visit- she is going on Friday to see Dr GCruzita Lederer I will update her calcium and PTH levels today anticipation of this visit  Last visit she was having some problems with side effects from pravastatin, body aches.  I asked her to try taking it every other day- she tried this but she had to stop taking it as sx persisted   She is checking her BP at home- running generally approx 130/80  Tetanus vaccine-recommended today.  She has no current wound, so this will need to be done at her pharmacy Shingrix- recommended to her , she did had zostavax back in 2018 COVID-19 booster; she had her 3rd dose  Complete labs done in February  Fosamax-she notes that she actually ran out a few months ago.  We will refill for her today Amlodipine Pravastatin- NOT taking due to persistent side effects Patient Active Problem List   Diagnosis Date Noted   Right thyroid nodule 06/13/2019   Ventral hernia without obstruction or gangrene 10/30/2016   Sedentary lifestyle 10/30/2016   Osteoporosis 06/02/2016   Vitamin D insufficiency 06/02/2016   Primary hyperparathyroidism ([place] 06/04/2015   HTN (hypertension) 11/11/2012   High cholesterol 11/11/2012    Past Medical History:  Diagnosis Date    Hyperlipidemia    Hypertension    Hyperthyroidism     Past Surgical History:  Procedure Laterality Date   COLONOSCOPY  08/10/2019   PARATHYROIDECTOMY Left 03/28/2019   Procedure: LEFT PARATHYROIDECTOMY;  Surgeon: GArmandina Gemma MD;  Location: [facility];  Service: General;  Laterality: Left;   POLYPECTOMY      Social History   Tobacco Use   Smoking status: Never   Smokeless tobacco: Never  Vaping Use   Vaping Use: Never used  Substance Use Topics   Alcohol use: No   Drug use: No    Family History  Problem Relation Age of Onset   Colon cancer Neg Hx    Colon polyps Neg Hx    Esophageal cancer Neg Hx    Rectal cancer Neg Hx    Stomach cancer Neg Hx     Allergies  Allergen Reactions   Lisinopril Swelling    hands and face swell    Medication list has been reviewed and updated.  Current Outpatient Medications on File Prior to Visit  Medication Sig Dispense Refill   amLODipine (NORVASC) 10 MG tablet TAKE 1 TABLET BY MOUTH EVERY DAY 90 tablet 4   aspirin 81 MG tablet Take 81 mg by mouth daily.     Cholecalciferol 1000 UNITS capsule Take 1,000 Units by mouth daily.     Omega-3 Fatty Acids (FISH OIL) 1000 MG CAPS Take by mouth.     No current facility-administered medications on file prior to visit.    Review of Systems:  As  per HPI- otherwise negative.   Physical Examination: Vitals:   06/13/21 0817  BP: 132/82  Pulse: 72  Resp: 16  Temp: 97.6 F (36.4 C)  SpO2: 99%   Vitals:   06/13/21 0817  Weight: 172 lb (78 kg)  Height: '5\' 3"'$  (1.6 m)   Body mass index is 30.47 kg/m. Ideal Body Weight: Weight in (lb) to have BMI = 25: 140.8  GEN: no acute distress.  Overweight, looks well HEENT: Atraumatic, Normocephalic.  Bilateral TM wnl, oropharynx normal.  PEERL,EOMI.   Ears and Nose: No external deformity. CV: RRR, No M/G/R. No JVD. No thrill. No extra heart sounds. PULM: CTA B, no wheezes, crackles, rhonchi. No retractions. No resp. distress. No accessory  muscle use. ABD: S, NT, ND, +BS. No rebound. No HSM. EXTR: No c/c/e PSYCH: Normally interactive. Conversant.    Assessment and Plan: Essential hypertension - Plan: CBC, Basic metabolic panel  Primary hyperparathyroidism ([place]) - Plan: Basic metabolic panel, VITAMIN D 25 Hydroxy (Vit-D Deficiency, Fractures), PTH, intact (no Ca), TSH  History of vitamin D deficiency - Plan: VITAMIN D 25 Hydroxy (Vit-D Deficiency, Fractures)  Mixed hyperlipidemia - Plan: Lipid panel  Other osteoporosis without current pathological fracture - Plan: alendronate (FOSAMAX) 70 MG tablet  Immunization due Patient seen today for a follow-up visit.  Blood pressures under good control, continue amlodipine She is not currently taking her statin due to persistent side effects.  We will check her lipids today, and then decide on the next step Check PTH, thyroid, vitamin D today Refill Fosamax Discussed recommended immunizations This visit occurred during the SARS-CoV-2 public health emergency.  Safety protocols were in place, including screening questions prior to the visit, additional usage of staff PPE, and extensive cleaning of exam room while observing appropriate contact time as indicated for disinfecting solutions.   Signed Lamar Blinks, MD  Addendum 8/23-received labs as below-PTH is not back, await this to communicate with patient.  She does not have my chart Calcium is elevated but better than previous Addnd 8/24- PTH has returned, letter to pt I have also CC labs to endocrinology Dr Cruzita Lederer   Results for orders placed or performed in visit on 06/13/21  CBC  Result Value Ref Range   WBC 4.6 4.0 - 10.5 K/uL   RBC 4.54 3.87 - 5.11 Mil/uL   Platelets 274.0 150.0 - 400.0 K/uL   Hemoglobin 13.0 12.0 - 15.0 g/dL   HCT 39.6 36.0 - 46.0 %   MCV 87.3 78.0 - 100.0 fl   MCHC 32.8 30.0 - 36.0 g/dL   RDW 14.3 11.5 - [id number] %  Basic metabolic panel  Result Value Ref Range   Sodium 140 135 - 145 mEq/L    Potassium 4.2 3.5 - 5.1 mEq/L   Chloride 104 96 - 112 mEq/L   CO2 27 19 - 32 mEq/L   Glucose, Bld 83 70 - 99 mg/dL   BUN 17 6 - 23 mg/dL   Creatinine, Ser 0.53 0.40 - 1.20 mg/dL   GFR 92.80 >60.00 mL/min   Calcium 10.9 (H) 8.4 - 10.5 mg/dL  VITAMIN D 25 Hydroxy (Vit-D Deficiency, Fractures)  Result Value Ref Range   VITD 54.04 30.00 - 100.00 ng/mL  Lipid panel  Result Value Ref Range   Cholesterol 282 (H) 0 - 200 mg/dL   Triglycerides 123.0 0.0 - 149.0 mg/dL   HDL 70.00 >39.00 mg/dL   VLDL 24.6 0.0 - 40.0 mg/dL   LDL Cholesterol 188 (H) 0 - 99 mg/dL  Total CHOL/HDL Ratio 4    NonHDL 212.13   TSH  Result Value Ref Range   TSH 0.98 0.35 - 5.50 uIU/mL

## 2021-06-08 NOTE — Patient Instructions (Addendum)
It was great to see you again today, assuming all is well please see me in about 6 months Please consider getting the new shingles vaccine- Shingrix- at your convenience  You are also due for a tetanus vaccine which can be done at your pharmacy- if you do have a wound we will boost this for you  I will be in touch with your labs

## 2021-06-13 ENCOUNTER — Ambulatory Visit (INDEPENDENT_AMBULATORY_CARE_PROVIDER_SITE_OTHER): Payer: Medicare (Managed Care) | Admitting: Family Medicine

## 2021-06-13 ENCOUNTER — Other Ambulatory Visit: Payer: Self-pay

## 2021-06-13 VITALS — BP 132/82 | HR 72 | Temp 97.6°F | Resp 16 | Ht 63.0 in | Wt 172.0 lb

## 2021-06-13 DIAGNOSIS — E782 Mixed hyperlipidemia: Secondary | ICD-10-CM | POA: Diagnosis not present

## 2021-06-13 DIAGNOSIS — E210 Primary hyperparathyroidism: Secondary | ICD-10-CM

## 2021-06-13 DIAGNOSIS — Z23 Encounter for immunization: Secondary | ICD-10-CM

## 2021-06-13 DIAGNOSIS — I10 Essential (primary) hypertension: Secondary | ICD-10-CM

## 2021-06-13 DIAGNOSIS — Z8639 Personal history of other endocrine, nutritional and metabolic disease: Secondary | ICD-10-CM | POA: Diagnosis not present

## 2021-06-13 DIAGNOSIS — M818 Other osteoporosis without current pathological fracture: Secondary | ICD-10-CM

## 2021-06-13 LAB — BASIC METABOLIC PANEL
BUN: 17 mg/dL (ref 6–23)
CO2: 27 mEq/L (ref 19–32)
Calcium: 10.9 mg/dL — ABNORMAL HIGH (ref 8.4–10.5)
Chloride: 104 mEq/L (ref 96–112)
Creatinine, Ser: 0.53 mg/dL (ref 0.40–1.20)
GFR: 92.8 mL/min (ref >60.00)
Glucose, Bld: 83 mg/dL (ref 70–99)
Potassium: 4.2 mEq/L (ref 3.5–5.1)
Sodium: 140 mEq/L (ref 135–145)

## 2021-06-13 LAB — LIPID PANEL
Cholesterol: 282 mg/dL — ABNORMAL HIGH (ref 0–200)
HDL: 70 mg/dL (ref >39.00)
LDL Cholesterol: 188 mg/dL — ABNORMAL HIGH (ref 0–99)
NonHDL: 212.13
Total CHOL/HDL Ratio: 4
Triglycerides: 123 mg/dL (ref 0.0–149.0)
VLDL: 24.6 mg/dL (ref 0.0–40.0)

## 2021-06-13 LAB — CBC
HCT: 39.6 % (ref 36.0–46.0)
Hemoglobin: 13 g/dL (ref 12.0–15.0)
MCHC: 32.8 g/dL (ref 30.0–36.0)
MCV: 87.3 fl (ref 78.0–100.0)
Platelets: 274 10*3/uL (ref 150.0–400.0)
RBC: 4.54 Mil/uL (ref 3.87–5.11)
RDW: 14.3 % (ref 11.5–15.5)
WBC: 4.6 10*3/uL (ref 4.0–10.5)

## 2021-06-13 LAB — TSH: TSH: 0.98 u[IU]/mL (ref 0.35–5.50)

## 2021-06-13 LAB — VITAMIN D 25 HYDROXY (VIT D DEFICIENCY, FRACTURES): VITD: 54.04 ng/mL (ref 30.00–100.00)

## 2021-06-13 MED ORDER — ALENDRONATE SODIUM 70 MG PO TABS: 70.0000 mg | ORAL_TABLET | ORAL | 4 refills | Status: DC

## 2021-06-14 LAB — PARATHYROID HORMONE, INTACT (NO CA): PTH: 91 pg/mL — ABNORMAL HIGH (ref 16–77)

## 2021-06-17 ENCOUNTER — Encounter: Payer: Self-pay | Admitting: Internal Medicine

## 2021-06-17 ENCOUNTER — Ambulatory Visit (INDEPENDENT_AMBULATORY_CARE_PROVIDER_SITE_OTHER): Payer: Medicare (Managed Care) | Admitting: Internal Medicine

## 2021-06-17 ENCOUNTER — Other Ambulatory Visit: Payer: Self-pay

## 2021-06-17 VITALS — BP 128/80 | HR 76 | Ht 63.0 in | Wt 172.2 lb

## 2021-06-17 DIAGNOSIS — E210 Primary hyperparathyroidism: Secondary | ICD-10-CM

## 2021-06-17 DIAGNOSIS — E559 Vitamin D deficiency, unspecified: Secondary | ICD-10-CM

## 2021-06-17 DIAGNOSIS — M818 Other osteoporosis without current pathological fracture: Secondary | ICD-10-CM

## 2021-06-17 DIAGNOSIS — E041 Nontoxic single thyroid nodule: Secondary | ICD-10-CM

## 2021-06-17 NOTE — Progress Notes (Addendum)
Patient ID: Erica Navarro, female   DOB: Nov 01, 1950, 70 y.o.   MRN: [id number]  This visit occurred during the SARS-CoV-2 public health emergency.  Safety protocols were in place, including screening questions prior to the visit, additional usage of staff PPE, and extensive cleaning of exam room while observing appropriate contact time as indicated for disinfecting solutions.   HPI  Erica Navarro is a 70 y.o.-year-old female, returning for f/u for h/o primary hyperparathyroidism - now s/p parathyroidectomy, vit D insufficiency, and OP. Last visit 1 year ago.  Interim history: Patient recently saw PCP and a calcium PTH levels were elevated again. No HAs, constipation or mm/joint aches.   No falls or fractures since last visit.  Reviewed and addended history: Pt was dx with hypercalcemia in 2016.  Reviewed pertinent labs: Lab Results  Component Value Date   PTH 91 (H) 06/13/2021   PTH 55 06/11/2020   PTH Comment 06/11/2020   PTH 30 06/13/2019   PTH Comment 06/13/2019   PTH 65 06/11/2018   PTH Comment 06/11/2018   PTH 46 06/01/2017   PTH 43 06/02/2016   PTH Comment 06/02/2016   CALCIUM 10.9 (H) 06/13/2021   CALCIUM 11.5 (H) 12/08/2020   CALCIUM 10.6 (H) 06/11/2020   CALCIUM 10.6 (H) 03/08/2020   CALCIUM 10.8 (H) 06/13/2019   CALCIUM 10.5 (H) 03/21/2019   CALCIUM 10.3 01/07/2019   CALCIUM 10.9 (H) 12/02/2018   CALCIUM 11.1 (H) 06/11/2018   CALCIUM 11.3 (H) 05/30/2018   Osteoporosis:  We started Fosamax 70 mg weekly 2017.  No hip, thigh, jaw pain.   I advised her to stop Fosamax and start Prolia after the below results returned (06/2020). She stopped Fosamax 05-03/2021 and was not able to start Prolia 2/2 insurance...   T-scores improved after addition of bisphosphonate, but latest L femoral neck BMD was lower: 07/16/2020 [facility])  Lumbar spine (L1-L4) Femoral neck (FN)  T-score - 3.1  RFN: - 1.9 LFN: - 2.6   06/17/2018 ([facility])  Lumbar spine (L1-L4) Femoral neck (FN)  T-score -  3.1 RFN: - 1.9 LFN: - 2.0   12/03/2015 ([facility])  Lumbar spine (L1-L4) Femoral neck (FN)  T-score - 3.6 RFN: - 2.1 LFN: - 2.4   No history of kidney stones.  No CKD. Last BUN/Cr: Lab Results  Component Value Date   BUN 17 06/13/2021   CREATININE 0.53 06/13/2021   She is not on HCTZ.  Vitamin D insufficiency, now resolved: Lab Results  Component Value Date   VD25OH 54.04 06/13/2021   VD25OH 62.75 03/08/2020   VD25OH 58.85 06/13/2019   VD25OH 50.51 05/30/2018   VD25OH 48.93 10/31/2017   VD25OH 47.02 06/01/2017   VD25OH 42.29 10/30/2016   VD25OH 36.55 06/02/2016   VD25OH 23.45 (L) 06/04/2015   VD25OH 37.83 12/04/2014   She continues on 3000 units vitamin D daily.  Pt does not have a FH of hypercalcemia, pituitary tumors, thyroid cancer, or osteoporosis.   Reviewed her initial labs: Component     Latest Ref Rng 12/04/2014  BUN     6 - 23 mg/dL 16  Creatinine     0.40 - 1.20 mg/dL 0.63  Calcium     8.4 - 10.5 mg/dL 10.9 (H)  GFR     >60.00 mL/min 121.86  Vitamin D 1, 25 (OH) Total     18 - 72 pg/mL 96 (H)  Vitamin D3 1, 25 (OH)      96  Vitamin D2 1, 25 (OH)      <  8  PTH     15 - 65 pg/mL 82 (H)  Calcium Ionized     1.12 - 1.32 mmol/L 1.50 (H)  VITD     30.00 - 100.00 ng/mL 37.83  Phosphorus     2.3 - 4.6 mg/dL 2.7  Magnesium     1.5 - 2.5 mg/dL 1.9   Component     Latest Ref Rng 01/11/2015 01/11/2015         3:03 PM  3:07 PM  Calcium, Ur      4   Calcium, 24 hour urine     100 - 250 mg/day 112   Creatinine, Urine      50.4 50.5  Creatinine, 24H Ur     700 - 1800 mg/day 1412 1415   Results pointed towards primary hyperparathyroidism. Her 24-hour urine calcium was not elevated. FeCa= 0.0045 (0.45%).  This was lower than expected from primary hyperparathyroidism, however, there was low suspicion for familial hypocalciuric hypercalcemia due to previously normal calcium levels and also normal magnesium levels.  She initially refused a referral to  surgery, but afterwards agreed with a referral to Dr. Harlow Asa.  Reviewed the results of her previous tests: Thyroid ultrasound (09/21/2018): 1 thyroid nodule 3.0 x 2.2 x 2.0, solid, isoechoic FNA of this nodule (10/01/2018): Benign Technetium sestamibi scan (09/04/2018): Not localizing 4D CT (12/09/2018): Posterior left thyroid lobe mass measuring 6.9 mm  She had parathyroidectomy on 03/28/2019: Enlarged left posterior parathyroid gland: 721 mg, 2.1 cm in diameter  Her calcium decreased from 11.3 preop to 10.7 postop  (04/09/2019).   Her PTH decreased from 82 preop to 65 postop.  She also has a history of HTN, HL.  ROS: Constitutional: no weight gain/no weight loss, no fatigue, no subjective hyperthermia, no subjective hypothermia Eyes: no blurry vision, no xerophthalmia ENT: no sore throat, no nodules palpated in neck, no dysphagia, no odynophagia, no hoarseness Cardiovascular: no CP/no SOB/no palpitations/no leg swelling Respiratory: no cough/no SOB/no wheezing Gastrointestinal: no N/no V/no D/no C/no acid reflux Musculoskeletal: no muscle aches/no joint aches Skin: no rashes, no hair loss Neurological: no tremors/no numbness/no tingling/no dizziness  I reviewed pt's medications, allergies, PMH, social hx, family hx, and changes were documented in the history of present illness. Otherwise, unchanged from my initial visit note.  Past Medical History:  Diagnosis Date   Hyperlipidemia    Hypertension    Hyperthyroidism    Past Surgical History:  Procedure Laterality Date   COLONOSCOPY  08/10/2019   PARATHYROIDECTOMY Left 03/28/2019   Procedure: LEFT PARATHYROIDECTOMY;  Surgeon: [name], MD;  Location: [facility];  Service: General;  Laterality: Left;   POLYPECTOMY     History   Social History   Marital Status: Single    Spouse Name: N/A   Number of Children: 4   Occupational History   janitor   Social History Main Topics   Smoking status: Never Smoker    Smokeless  tobacco: Not on file   Alcohol Use: No   Drug Use: No   Current Outpatient Medications on File Prior to Visit  Medication Sig Dispense Refill   alendronate (FOSAMAX) 70 MG tablet Take 1 tablet (70 mg total) by mouth once a week. 12 tablet 4   amLODipine (NORVASC) 10 MG tablet TAKE 1 TABLET BY MOUTH EVERY DAY 90 tablet 4   aspirin 81 MG tablet Take 81 mg by mouth daily.     Cholecalciferol 1000 UNITS capsule Take 1,000 Units by mouth daily.  Omega-3 Fatty Acids (FISH OIL) 1000 MG CAPS Take by mouth.     No current facility-administered medications on file prior to visit.   Allergies  Allergen Reactions   Lisinopril Swelling    hands and face swell   Pravastatin     Body aches    FH: - see HPI + - HTN and heart ds in mother - HL in sister  PE: BP 128/80 (BP Location: Right Arm, Patient Position: Sitting, Cuff Size: Normal)   Pulse 76   Ht '5\' 3"'$  (1.6 m)   Wt 172 lb 3.2 oz (78.1 kg)   SpO2 98%   BMI 30.50 kg/m  Body mass index is 30.5 kg/m.  Wt Readings from Last 3 Encounters:  06/17/21 172 lb 3.2 oz (78.1 kg)  06/13/21 172 lb (78 kg)  04/20/21 170 lb 3.2 oz (77.2 kg)   Constitutional: overweight, in NAD Eyes: PERRLA, EOMI, no exophthalmos ENT: moist mucous membranes, + L thyroid fullness, cervical scar healed, no cervical lymphadenopathy Cardiovascular: RRR, No MRG Respiratory: CTA B Gastrointestinal: abdomen soft, NT, ND, BS+ Musculoskeletal: no deformities, strength intact in all 4 Skin: moist, warm, no rashes Neurological: no tremor with outstretched hands, DTR normal in all 4  Assessment: 1. Primary hyperparathyroidism  2. Vitamin D insufficiency  3. OP  4. R Thyroid nodule  5. HTN  Plan: Patient has had several instances of elevated calcium, with the highest being 11.3  An intact PTH level was also high, at 104, for a calcium of 10.7.  Afterwards, her PTH decreased to normal levels, however, this was inappropriate suppression in the presence of a  high calcium level.  She has no history of nephrolithiasis but does have osteoporosis.  She was previously on Fosamax, which was helping with lowering calcium levels.  Biochemical investigation pointed towards primary hyperparathyroidism.  A technetium sestamibi scan was nonlocalizing but a 4D CT showed a posterior left lobe mass consistent with a parathyroid adenoma.  She had parathyroidectomy in 03/2019 by Dr. Harlow Asa.  The posterior left lobe mass turned out to be a hyperplastic parathyroid gland.  Calcium and PTH levels decreased after surgery, but not quite to normal.  She had a recent calcium and PTH level checked by PCP.  Calcium was still elevated, at 10.9, previously 11.5; and PTH was also elevated, at 91.  This was checked with the [facility] assay. -At this point, we discussed about possible referral back to surgery, versus follow-up.  My suggestion would be for surgical intervention.  She would like to think about it and let me know.  Discussed about benefits and risks of surgical intervention. -I will see the patient back in 1 year  2. Vitamin D insufficiency -Currently on 3000 units vitamin D daily -Her vitamin D level was normal, at 54, on 06/13/2021.  3. OP -We started Fosamax 70 mg weekly in 2017.  At last visit, she completed 4 years of treatment.  She had no side effects from it. -A bone density scan obtained after last visit showed lower T-scores at the level of the left femoral neck -At that time, I suggested to change from Fosamax to Prolia.  She did stop Fosamax but she was not able to start Prolia yet.  We will try to get it approved by her insurance.  For now, she is planning to pick up a new prescription of Fosamax and I advised her to do so until we can start Prolia -She is aware about possible side effects of Prolia,  similar to bisphosphonates: ONJ and atypical fractures - no falls or fractures since last visit  4. Thyroid nodule -Patient has a right mid thyroid nodule,  isoechoic, solid, 3 cm in the largest dimension.  This was biopsied with benign results -No neck compression symptoms -We will continue to follow her clinically and will also repeat the ultrasound now -Most recent TSH was normal 4 days ago: Lab Results  Component Value Date   TSH 0.98 06/13/2021   Orders Placed This Encounter  Procedures   US THYROID   Thyroid U/S (06/28/2021):  Parenchymal Echotexture: Mildly heterogenous Isthmus: 0.3 cm Right lobe: 4.5 x 2.3 x 2.7 cm Left lobe: 3.3 x 1.5 x 1.4 cm  _________________________________________________________   The previously biopsied nodule in the right mid gland measures 3.6 x 2.2 x 2.0 cm compared to 3.4 x 2.2 x 2.0 cm previously. No significant interval change or new features identified. Incidental note is made of a tiny subcentimeter hypoechoic nodule more medially in the right mid gland. This finding does not meet criteria to warrant further evaluation.   No new nodules or suspicious features.   IMPRESSION: No significant interval change in the size or appearance of the previously biopsied nodule in the right mid gland. Recommend correlation with prior biopsy results.   No new nodules or suspicious features.  Philemon Kingdom, MD PhD [facility]

## 2021-06-17 NOTE — Patient Instructions (Addendum)
Please schedule a new thyroid U/S.  Please continue vitamin D 3000 units daily.  Please come back for a follow-up appointment in 6 months.

## 2021-06-18 ENCOUNTER — Telehealth: Payer: Self-pay

## 2021-06-18 NOTE — Telephone Encounter (Signed)
VOB initiated via [email or web address]

## 2021-06-28 ENCOUNTER — Ambulatory Visit
Admission: RE | Admit: 2021-06-28 | Discharge: 2021-06-28 | Disposition: A | Discharge status: Home / Self Care | Payer: Medicare (Managed Care) | Source: Ambulatory Visit | Attending: Internal Medicine | Admitting: Internal Medicine

## 2021-06-28 ENCOUNTER — Other Ambulatory Visit: Payer: Self-pay

## 2021-06-28 DIAGNOSIS — E210 Primary hyperparathyroidism: Secondary | ICD-10-CM

## 2021-06-28 DIAGNOSIS — E041 Nontoxic single thyroid nodule: Secondary | ICD-10-CM

## 2021-07-19 NOTE — Telephone Encounter (Signed)
Form faxed to [facility] along with last DEXA scan results and last OV note.

## 2021-08-09 ENCOUNTER — Telehealth: Payer: Self-pay | Admitting: Pharmacy Technician

## 2021-08-09 NOTE — Telephone Encounter (Signed)
Received notification from [facility] regarding a prior authorization for DENOSUMAB INJECTION. Authorization has been APPROVED from 09.27.22 to 9.27.23.    Authorization # [id number]

## 2021-08-25 NOTE — Telephone Encounter (Signed)
Baker, Monchell R, CPhT     3:09 PM Note Received notification from [facility] regarding a prior authorization for DENOSUMAB INJECTION. Authorization has been APPROVED from 09.27.22 to 9.27.23.      Authorization # [id number]

## 2021-08-25 NOTE — Telephone Encounter (Signed)
Pt ready for scheduling on or after 08/26/21  Out-of-pocket cost due at time of visit: $295  Primary: [employer] Prolia co-insurance: 20% (approximately $255) Admin fee co-insurance: 20% (approximately $25)  Secondary: n/a Prolia co-insurance:  Admin fee co-insurance:   Deductible: does not apply  Prior Auth: APPROVED PA# [id number] Valid: 07/19/21-07/19/22  ** This summary of benefits is an estimation of the patient's out-of-pocket cost. Exact cost may vary based on individual plan coverage.

## 2021-09-07 ENCOUNTER — Ambulatory Visit (INDEPENDENT_AMBULATORY_CARE_PROVIDER_SITE_OTHER): Payer: Medicare (Managed Care)

## 2021-09-07 ENCOUNTER — Other Ambulatory Visit: Payer: Self-pay

## 2021-09-07 DIAGNOSIS — M818 Other osteoporosis without current pathological fracture: Secondary | ICD-10-CM

## 2021-09-07 MED ORDER — DENOSUMAB 60 MG/ML ~~LOC~~ SOSY
60.0000 mg | PREFILLED_SYRINGE | Freq: Once | SUBCUTANEOUS | Status: AC
  Administered 2021-09-07: 60 mg via SUBCUTANEOUS

## 2021-09-07 NOTE — Progress Notes (Signed)
Prolia injection administered left arm SubQ. Patient education provided and monitored for 15 mins post injection. Patient tolerated well.

## 2021-09-24 NOTE — Telephone Encounter (Signed)
Last Prolia inj 09/07/21 Next Prolia inj due 03/08/22

## 2021-12-16 ENCOUNTER — Ambulatory Visit: Payer: Medicare (Managed Care) | Admitting: Internal Medicine

## 2021-12-16 ENCOUNTER — Other Ambulatory Visit: Payer: Self-pay

## 2021-12-16 ENCOUNTER — Encounter: Payer: Self-pay | Admitting: Internal Medicine

## 2021-12-16 VITALS — BP 120/82 | HR 82 | Ht 63.0 in | Wt 170.6 lb

## 2021-12-16 DIAGNOSIS — E559 Vitamin D deficiency, unspecified: Secondary | ICD-10-CM

## 2021-12-16 DIAGNOSIS — E210 Primary hyperparathyroidism: Secondary | ICD-10-CM

## 2021-12-16 DIAGNOSIS — E041 Nontoxic single thyroid nodule: Secondary | ICD-10-CM | POA: Diagnosis not present

## 2021-12-16 DIAGNOSIS — M818 Other osteoporosis without current pathological fracture: Secondary | ICD-10-CM

## 2021-12-16 LAB — VITAMIN D 25 HYDROXY (VIT D DEFICIENCY, FRACTURES): VITD: 50.57 ng/mL (ref 30.00–100.00)

## 2021-12-16 NOTE — Patient Instructions (Signed)
Please stop at the lab.  Please come back for a follow-up appointment in 1 year.  

## 2021-12-16 NOTE — Patient Instructions (Addendum)
Good to see you again today- please see me in about 6 months assuming all is well I will be in touch with your labs asap   Flu shot given today Please get a tetanus booster at your pharmacy.  You might also consider getting the new shingles vaccine, called Shingrix

## 2021-12-16 NOTE — Progress Notes (Addendum)
[facility] [place], [place], [place] [address] [phone]  Date:  12/19/2021   Name:  Erica Navarro   DOB:  [birth year]   MRN:  [id number]  PCP:  Darreld Mclean, MD    Chief Complaint: 6 month follow up (Concerns/ questions: none/Tdap: none in last 10 years/Flu shot: has not had/Zoster: never)   History of Present Illness:  Erica Navarro is a 71 y.o. very pleasant female patient who presents with the following:  Pt seen today for a 6 month follow-up visit  Last seen by myself in August - history of hypertension, hyperparathyroidism, thyroid nodule, hyperlipidemia, osteoporosis Last seen by myself in February She is followed by endocrinology for hyperparathyroidism-status post parathyroidectomy in 2020   She is seeing Dr Cruzita Lederer last week We started Fosamax 70 mg weekly 2017.  No hip, thigh, jaw pain.   I advised her to stop Fosamax and start Prolia after the below results returned (06/2020). She stopped Fosamax 05-03/2021 and was not able to start Prolia 2/2 insurance - finally started 09/07/2021.  No side effects. Plan: Patient with history of primary hyperparathyroidism, with elevated calcium levels as high as 11.5.  An intact PTH level was also high, at 104, for a calcium of 10.7.   -At the admission sestamibi scan was nonlocalizing, but a 4D CT showed a posterior left lobe mass consistent with a parathyroid adenoma.  She had parathyroidectomy in 03/2019 by Dr. Harlow Asa.  The posterior left lobe mass turned out to be a hyperplastic parathyroid gland.  Calcium and PTH levels decreased after surgery but not to normal.  In 05/2021, calcium was still elevated, at 10.9 and a PTH was 91, checked with the [facility] assay. -At last visit, we discussed about possible referral back to surgery, versus follow-up.  My suggestion was for surgical intervention, but she wanted to think about it.  Discussed about benefits and risks of  surgical intervention. -For now, we will repeat calcium and PTH ([facility] assay) -I will see the patient back in 1 year   Flu vaccine- will do today  Pt needs a tetanus- recommended at drug store  She got prolia back in November - she tolerated well Next shot in May  She had a Zostavax in 2018.  Mention to her that she could have Shingrix done at her convenience for better shingles prevention  She is on amlodipine 10 mg- her BP at home generally no higher than 130/70 She does monitor her blood pressure on a regular basis  Patient Active Problem List   Diagnosis Date Noted   Right thyroid nodule 06/13/2019   Ventral hernia without obstruction or gangrene 10/30/2016   Sedentary lifestyle 10/30/2016   Osteoporosis 06/02/2016   Vitamin D insufficiency 06/02/2016   Primary hyperparathyroidism ([place]) 06/04/2015   HTN (hypertension) 11/11/2012   High cholesterol 11/11/2012    Past Medical History:  Diagnosis Date   Hyperlipidemia    Hypertension    Hyperthyroidism     Past Surgical History:  Procedure Laterality Date   COLONOSCOPY  08/10/2019   PARATHYROIDECTOMY Left 03/28/2019   Procedure: LEFT PARATHYROIDECTOMY;  Surgeon: [name], MD;  Location: [facility];  Service: General;  Laterality: Left;   POLYPECTOMY      Social History   Tobacco Use   Smoking status: Never   Smokeless tobacco: Never  Vaping Use   Vaping Use: Never used  Substance Use Topics   Alcohol use: No  Drug use: No    Family History  Problem Relation Age of Onset   Colon cancer Neg Hx    Colon polyps Neg Hx    Esophageal cancer Neg Hx    Rectal cancer Neg Hx    Stomach cancer Neg Hx     Allergies  Allergen Reactions   Lisinopril Swelling    hands and face swell   Pravastatin     Body aches     Medication list has been reviewed and updated.  Current Outpatient Medications on File Prior to Visit  Medication Sig Dispense Refill   amLODipine (NORVASC) 10 MG tablet TAKE 1 TABLET BY MOUTH  EVERY DAY 90 tablet 4   aspirin 81 MG tablet Take 81 mg by mouth daily.     Cholecalciferol 1000 UNITS capsule Take 3,000 Units by mouth daily.     denosumab (PROLIA) 60 MG/ML SOSY injection Inject 60 mg into the skin every 6 (six) months.     Omega-3 Fatty Acids (FISH OIL) 1000 MG CAPS Take by mouth.     No current facility-administered medications on file prior to visit.    Review of Systems:  As per HPI- otherwise negative. BP Readings from Last 3 Encounters:  12/19/21 (!) 148/80  12/16/21 120/82  06/17/21 128/80      Physical Examination: Vitals:   12/19/21 1303  BP: (!) 148/80  Pulse: 71  Resp: 18  Temp: 98 F (36.7 C)  SpO2: 98%   Vitals:   12/19/21 1303  Weight: 171 lb 9.6 oz (77.8 kg)  Height: 5\' 3"  (1.6 m)   Body mass index is 30.4 kg/m. Ideal Body Weight: Weight in (lb) to have BMI = 25: 140.8  GEN: no acute distress.  Mild obesity, looks well HEENT: Atraumatic, Normocephalic.  Bilateral TM wnl, oropharynx normal.  PEERL,EOMI.  Well-healed scar from prior parathyroidectomy Ears and Nose: No external deformity. CV: RRR, No M/G/R. No JVD. No thrill. No extra heart sounds. PULM: CTA B, no wheezes, crackles, rhonchi. No retractions. No resp. distress. No accessory muscle use. ABD: S, NT, ND No rebound. No HSM. EXTR: No c/c/e PSYCH: Normally interactive. Conversant.    Assessment and Plan: Mixed hyperlipidemia - Plan: Lipid panel  Essential hypertension - Plan: CBC, Comprehensive metabolic panel  Elevated glucose level - Plan: Hemoglobin A1c  Chronic pain in left foot - Plan: Ambulatory referral to Podiatry  Need for influenza vaccination - Plan: Flu Vaccine QUAD High Dose(Fluad)  Patient seen today for follow-up.  Blood pressure generally under good control, patient does monitor at home regularly Continue 5 mg of amlodipine Follow-up on blood sugar-check for prediabetes with A1c today Update flu vaccine.  Encouraged tetanus booster and  Shingrix Patient notes a heel spur, she had a cortisone shot for this in the past which was helpful.  Referral to podiatry Will plan further follow- up pending labs.   Signed Lamar Blinks, MD  Addendum 2/28.  Received labs as below, letter to patient Results for orders placed or performed in visit on 12/19/21  CBC  Result Value Ref Range   WBC 5.7 4.0 - 10.5 K/uL   RBC 4.59 3.87 - 5.11 Mil/uL   Platelets 297.0 150.0 - 400.0 K/uL   Hemoglobin 12.9 12.0 - 15.0 g/dL   HCT 40.0 36.0 - 46.0 %   MCV 87.1 78.0 - 100.0 fl   MCHC 32.2 30.0 - 36.0 g/dL   RDW 14.9 11.5 - 15.5 %  Comprehensive metabolic panel  Result Value Ref  Range   Sodium 138 135 - 145 mEq/L   Potassium 4.1 3.5 - 5.1 mEq/L   Chloride 103 96 - 112 mEq/L   CO2 26 19 - 32 mEq/L   Glucose, Bld 100 (H) 70 - 99 mg/dL   BUN 10 6 - 23 mg/dL   Creatinine, Ser 0.54 0.40 - 1.20 mg/dL   Total Bilirubin 0.5 0.2 - 1.2 mg/dL   Alkaline Phosphatase 60 39 - 117 U/L   AST 18 0 - 37 U/L   ALT 11 0 - 35 U/L   Total Protein 7.8 6.0 - 8.3 g/dL   Albumin 4.5 3.5 - 5.2 g/dL   GFR 92.04 >60.00 mL/min   Calcium 10.9 (H) 8.4 - 10.5 mg/dL  Hemoglobin A1c  Result Value Ref Range   Hgb A1c MFr Bld 5.5 4.6 - 6.5 %  Lipid panel  Result Value Ref Range   Cholesterol 324 (H) 0 - 200 mg/dL   Triglycerides 80.0 0.0 - 149.0 mg/dL   HDL 70.70 >39.00 mg/dL   VLDL 16.0 0.0 - 40.0 mg/dL   LDL Cholesterol 237 (H) 0 - 99 mg/dL   Total CHOL/HDL Ratio 5    NonHDL 252.86

## 2021-12-16 NOTE — Progress Notes (Addendum)
Patient ID: Erica Navarro, female   DOB: May 16, 1951, 71 y.o.   MRN: [id number]  This visit occurred during the SARS-CoV-2 public health emergency.  Safety protocols were in place, including screening questions prior to the visit, additional usage of staff PPE, and extensive cleaning of exam room while observing appropriate contact time as indicated for disinfecting solutions.   HPI  Erica Navarro is a 71 y.o.-year-old female, returning for f/u for h/o primary hyperparathyroidism - now s/p parathyroidectomy, vit D insufficiency, and OP. Last visit 6 months ago..  Interim history: No falls or fractures since last visit.  No headaches, constipation. She has some muscle/joint aches, stable. No vertigo, dizziness, vision problems.  Reviewed and addended history: Pt was dx with hypercalcemia in 2016.  Reviewed pertinent labs: Lab Results  Component Value Date   PTH 91 (H) 06/13/2021   PTH 55 06/11/2020   PTH Comment 06/11/2020   PTH 30 06/13/2019   PTH Comment 06/13/2019   PTH 65 06/11/2018   PTH Comment 06/11/2018   PTH 46 06/01/2017   PTH 43 06/02/2016   PTH Comment 06/02/2016   CALCIUM 10.9 (H) 06/13/2021   CALCIUM 11.5 (H) 12/08/2020   CALCIUM 10.6 (H) 06/11/2020   CALCIUM 10.6 (H) 03/08/2020   CALCIUM 10.8 (H) 06/13/2019   CALCIUM 10.5 (H) 03/21/2019   CALCIUM 10.3 01/07/2019   CALCIUM 10.9 (H) 12/02/2018   CALCIUM 11.1 (H) 06/11/2018   CALCIUM 11.3 (H) 05/30/2018   Osteoporosis:  We started Fosamax 70 mg weekly 2017.  No hip, thigh, jaw pain.   I advised her to stop Fosamax and start Prolia after the below results returned (06/2020). She stopped Fosamax 05-03/2021 and was not able to start Prolia 2/2 insurance - finally started 09/07/2021.  No side effects.  T-scores improved after addition of bisphosphonate, but latest L femoral neck BMD was lower: 07/16/2020 [facility])  Lumbar spine (L1-L4) Femoral neck (FN)  T-score - 3.1  RFN: - 1.9 LFN: - 2.6   06/17/2018 ([facility])  Lumbar  spine (L1-L4) Femoral neck (FN)  T-score - 3.1 RFN: - 1.9 LFN: - 2.0   12/03/2015 ([facility])  Lumbar spine (L1-L4) Femoral neck (FN)  T-score - 3.6 RFN: - 2.1 LFN: - 2.4   No history of kidney stones.  No CKD. Last BUN/Cr: Lab Results  Component Value Date   BUN 17 06/13/2021   CREATININE 0.53 06/13/2021   She is not on HCTZ.  Vitamin D insufficiency, now resolved: Lab Results  Component Value Date   VD25OH 54.04 06/13/2021   VD25OH 62.75 03/08/2020   VD25OH 58.85 06/13/2019   VD25OH 50.51 05/30/2018   VD25OH 48.93 10/31/2017   VD25OH 47.02 06/01/2017   VD25OH 42.29 10/30/2016   VD25OH 36.55 06/02/2016   VD25OH 23.45 (L) 06/04/2015   VD25OH 37.83 12/04/2014   She continues on 3000 units vitamin D daily.  Pt does not have a FH of hypercalcemia, pituitary tumors, thyroid cancer, or osteoporosis.   Reviewed her initial labs: Component     Latest Ref Rng 12/04/2014  BUN     6 - 23 mg/dL 16  Creatinine     0.40 - 1.20 mg/dL 0.63  Calcium     8.4 - 10.5 mg/dL 10.9 (H)  GFR     >60.00 mL/min 121.86  Vitamin D 1, 25 (OH) Total     18 - 72 pg/mL 96 (H)  Vitamin D3 1, 25 (OH)      96  Vitamin D2 1, 25 (OH)      <  8  PTH     15 - 65 pg/mL 82 (H)  Calcium Ionized     1.12 - 1.32 mmol/L 1.50 (H)  VITD     30.00 - 100.00 ng/mL 37.83  Phosphorus     2.3 - 4.6 mg/dL 2.7  Magnesium     1.5 - 2.5 mg/dL 1.9   Component     Latest Ref Rng 01/11/2015 01/11/2015         3:03 PM  3:07 PM  Calcium, Ur      4   Calcium, 24 hour urine     100 - 250 mg/day 112   Creatinine, Urine      50.4 50.5  Creatinine, 24H Ur     700 - 1800 mg/day 1412 1415   Results pointed towards primary hyperparathyroidism. Her 24-hour urine calcium was not elevated. FeCa= 0.0045 (0.45%).  This was lower than expected from primary hyperparathyroidism, however, there was low suspicion for familial hypocalciuric hypercalcemia due to previously normal calcium levels and also normal magnesium  levels.  She initially refused a referral to surgery, but afterwards agreed with a referral to Dr. Harlow Asa.  Reviewed the results of her previous tests: [employer] sestamibi scan (09/04/2018): Not localizing 4D CT (12/09/2018): Posterior left thyroid lobe mass measuring 6.9 mm  She had parathyroidectomy on 03/28/2019: Enlarged left posterior parathyroid gland: 721 mg, 2.1 cm in diameter  Her calcium decreased from 11.3 preop to 10.7 postop  (04/09/2019).   Her PTH decreased from 82 preop to 65 postop.  Thyroid nodules: Thyroid ultrasound (09/21/2018): 1 thyroid nodule 3.0 x 2.2 x 2.0, solid, isoechoic FNA of this nodule (10/01/2018): Benign Thyroid U/S (06/28/2021): Parenchymal Echotexture: Mildly heterogenous Isthmus: 0.3 cm Right lobe: 4.5 x 2.3 x 2.7 cm Left lobe: 3.3 x 1.5 x 1.4 cm  _________________________________________________________   The previously biopsied nodule in the right mid gland measures 3.6 x 2.2 x 2.0 cm compared to 3.4 x 2.2 x 2.0 cm previously. No significant interval change or new features identified. Incidental note is made of a tiny subcentimeter hypoechoic nodule more medially in the right mid gland. This finding does not meet criteria to warrant further evaluation.   No new nodules or suspicious features.   IMPRESSION: No significant interval change in the size or appearance of the previously biopsied nodule in the right mid gland. Recommend correlation with prior biopsy results.   No new nodules or suspicious features.  Pt denies: - feeling nodules in neck - hoarseness - dysphagia - choking - SOB with lying down  She also has a history of HTN, HL.  ROS: + see HPI  I reviewed pt's medications, allergies, PMH, social hx, family hx, and changes were documented in the history of present illness. Otherwise, unchanged from my initial visit note.  Past Medical History:  Diagnosis Date   Hyperlipidemia    Hypertension    Hyperthyroidism     Past Surgical History:  Procedure Laterality Date   COLONOSCOPY  08/10/2019   PARATHYROIDECTOMY Left 03/28/2019   Procedure: LEFT PARATHYROIDECTOMY;  Surgeon: [name], MD;  Location: [facility];  Service: General;  Laterality: Left;   POLYPECTOMY     History   Social History   Marital Status: Single    Spouse Name: N/A   Number of Children: 4   Occupational History   janitor   Social History Main Topics   Smoking status: Never Smoker    Smokeless tobacco: Not on file   Alcohol Use: No  Drug Use: No   Current Outpatient Medications on File Prior to Visit  Medication Sig Dispense Refill   alendronate (FOSAMAX) 70 MG tablet Take 1 tablet (70 mg total) by mouth once a week. 12 tablet 4   amLODipine (NORVASC) 10 MG tablet TAKE 1 TABLET BY MOUTH EVERY DAY 90 tablet 4   aspirin 81 MG tablet Take 81 mg by mouth daily.     Cholecalciferol 1000 UNITS capsule Take 1,000 Units by mouth daily.     Omega-3 Fatty Acids (FISH OIL) 1000 MG CAPS Take by mouth.     No current facility-administered medications on file prior to visit.   Allergies  Allergen Reactions   Lisinopril Swelling    hands and face swell   Pravastatin     Body aches    FH: - see HPI + - HTN and heart ds in mother - HL in sister  PE: There were no vitals taken for this visit. There is no height or weight on file to calculate BMI.  Wt Readings from Last 3 Encounters:  06/17/21 172 lb 3.2 oz (78.1 kg)  06/13/21 172 lb (78 kg)  04/20/21 170 lb 3.2 oz (77.2 kg)   Constitutional: overweight, in NAD Eyes: PERRLA, EOMI, no exophthalmos ENT: moist mucous membranes, + L thyroid fullness, cervical scar healed, no cervical lymphadenopathy Cardiovascular: RRR, No MRG Respiratory: CTA B Musculoskeletal: no deformities, strength intact in all 4 Skin: moist, warm, no rashes Neurological: no tremor with outstretched hands, DTR normal in all 4  Assessment: 1. Primary hyperparathyroidism  2. Vitamin D  insufficiency  3. OP  4. R Thyroid nodule  5. HTN  Plan: Patient with history of primary hyperparathyroidism, with elevated calcium levels as high as 11.5.  An intact PTH level was also high, at 104, for a calcium of 10.7.   -At the admission sestamibi scan was nonlocalizing, but a 4D CT showed a posterior left lobe mass consistent with a parathyroid adenoma.  She had parathyroidectomy in 03/2019 by Dr. Harlow Asa.  The posterior left lobe mass turned out to be a hyperplastic parathyroid gland.  Calcium and PTH levels decreased after surgery but not to normal.  In 05/2021, calcium was still elevated, at 10.9 and a PTH was 91, checked with the [facility] assay. -At last visit, we discussed about possible referral back to surgery, versus follow-up.  My suggestion was for surgical intervention, but she wanted to think about it.  Discussed about benefits and risks of surgical intervention. -For now, we will repeat calcium and PTH ([facility] assay) -I will see the patient back in 1 year  2. Vitamin D insufficiency -Currently on 3000 units vitamin D daily -Her vitamin D level was normal, at 54, on 06/13/2021 -We will repeat this now  3. OP -We started Fosamax 70 mg weekly in 2017.  She completed 4 years of treatment.  After this, I suggested Prolia.  She did stop Fosamax but she was not able to start Prolia until 09/07/2021, when she had her first dose. -She tolerates Prolia well, without hip/thigh/jaw pain -Bone density obtained before last visit showed lower T-scores at the level of the left femoral neck, before starting Prolia -She is due for another bone density scan in 06/2022 -She is aware about possible side effects of Prolia, similar to bisphosphonates: ONJ and atypical fractures -No falls or fractures since last visit  4. Thyroid nodule -Patient has a right mid thyroid nodule, isoechoic, solid, 3 cm in the largest dimension.  This  was previously biopsied with benign results -She denies neck  compression symptoms -We checked a thyroid ultrasound after last visit; stable right mid thyroid nodule -Most recent TSH was normal: Lab Results  Component Value Date   TSH 0.98 06/13/2021  -Continue to keep an eye on this nodule but no intervention is needed for now.  Component     Latest Ref Rng & Units 12/16/2021  Calcium     8.7 - 10.3 mg/dL 11.1 (H)  PTH, Intact     15 - 65 pg/mL 89 (H)  PTH Interp      Comment  VITD     30.00 - 100.00 ng/mL 50.57  Calcium and PTH levels remain high, while vitamin D level is normal.  At this point, I would suggest referral back to see Dr. Harlow Asa.  Will discuss with patient.  Addendum: Patient agrees to be referred back to Dr. Harlow Asa.  Philemon Kingdom, MD PhD [facility] Endocrinology

## 2021-12-19 ENCOUNTER — Encounter: Payer: Self-pay | Admitting: Family Medicine

## 2021-12-19 ENCOUNTER — Ambulatory Visit: Payer: Medicare (Managed Care) | Admitting: Family Medicine

## 2021-12-19 VITALS — BP 148/80 | HR 71 | Temp 98.0°F | Resp 18 | Ht 63.0 in | Wt 171.6 lb

## 2021-12-19 DIAGNOSIS — Z23 Encounter for immunization: Secondary | ICD-10-CM

## 2021-12-19 DIAGNOSIS — E782 Mixed hyperlipidemia: Secondary | ICD-10-CM | POA: Diagnosis not present

## 2021-12-19 DIAGNOSIS — G8929 Other chronic pain: Secondary | ICD-10-CM

## 2021-12-19 DIAGNOSIS — M79672 Pain in left foot: Secondary | ICD-10-CM

## 2021-12-19 DIAGNOSIS — I10 Essential (primary) hypertension: Secondary | ICD-10-CM

## 2021-12-19 DIAGNOSIS — R7309 Other abnormal glucose: Secondary | ICD-10-CM | POA: Diagnosis not present

## 2021-12-19 LAB — PTH, INTACT AND CALCIUM
Calcium: 11.1 mg/dL — ABNORMAL HIGH (ref 8.7–10.3)
PTH: 89 pg/mL — ABNORMAL HIGH (ref 15–65)

## 2021-12-19 NOTE — Addendum Note (Signed)
Addended by: Philemon Kingdom on: 12/19/2021 03:55 PM   Modules accepted: Orders

## 2021-12-20 LAB — LIPID PANEL
Cholesterol: 324 mg/dL — ABNORMAL HIGH (ref 0–200)
HDL: 70.7 mg/dL (ref >39.00)
LDL Cholesterol: 237 mg/dL — ABNORMAL HIGH (ref 0–99)
NonHDL: 252.86
Total CHOL/HDL Ratio: 5
Triglycerides: 80 mg/dL (ref 0.0–149.0)
VLDL: 16 mg/dL (ref 0.0–40.0)

## 2021-12-20 LAB — COMPREHENSIVE METABOLIC PANEL
ALT: 11 U/L (ref 0–35)
AST: 18 U/L (ref 0–37)
Albumin: 4.5 g/dL (ref 3.5–5.2)
Alkaline Phosphatase: 60 U/L (ref 39–117)
BUN: 10 mg/dL (ref 6–23)
CO2: 26 mEq/L (ref 19–32)
Calcium: 10.9 mg/dL — ABNORMAL HIGH (ref 8.4–10.5)
Chloride: 103 mEq/L (ref 96–112)
Creatinine, Ser: 0.54 mg/dL (ref 0.40–1.20)
GFR: 92.04 mL/min (ref >60.00)
Glucose, Bld: 100 mg/dL — ABNORMAL HIGH (ref 70–99)
Potassium: 4.1 mEq/L (ref 3.5–5.1)
Sodium: 138 mEq/L (ref 135–145)
Total Bilirubin: 0.5 mg/dL (ref 0.2–1.2)
Total Protein: 7.8 g/dL (ref 6.0–8.3)

## 2021-12-20 LAB — CBC
HCT: 40 % (ref 36.0–46.0)
Hemoglobin: 12.9 g/dL (ref 12.0–15.0)
MCHC: 32.2 g/dL (ref 30.0–36.0)
MCV: 87.1 fl (ref 78.0–100.0)
Platelets: 297 10*3/uL (ref 150.0–400.0)
RBC: 4.59 Mil/uL (ref 3.87–5.11)
RDW: 14.9 % (ref 11.5–15.5)
WBC: 5.7 10*3/uL (ref 4.0–10.5)

## 2021-12-20 LAB — HEMOGLOBIN A1C: Hgb A1c MFr Bld: 5.5 % (ref 4.6–6.5)

## 2021-12-26 ENCOUNTER — Encounter: Payer: Self-pay | Admitting: Podiatry

## 2021-12-26 ENCOUNTER — Other Ambulatory Visit: Payer: Self-pay

## 2021-12-26 ENCOUNTER — Ambulatory Visit: Payer: Medicare (Managed Care)

## 2021-12-26 ENCOUNTER — Telehealth: Payer: Self-pay | Admitting: Family Medicine

## 2021-12-26 ENCOUNTER — Ambulatory Visit (INDEPENDENT_AMBULATORY_CARE_PROVIDER_SITE_OTHER): Payer: Medicare (Managed Care) | Admitting: Podiatry

## 2021-12-26 DIAGNOSIS — E782 Mixed hyperlipidemia: Secondary | ICD-10-CM

## 2021-12-26 DIAGNOSIS — M722 Plantar fascial fibromatosis: Secondary | ICD-10-CM | POA: Diagnosis not present

## 2021-12-26 DIAGNOSIS — R52 Pain, unspecified: Secondary | ICD-10-CM

## 2021-12-26 MED ORDER — TRIAMCINOLONE ACETONIDE 10 MG/ML IJ SUSP
10.0000 mg | Freq: Once | INTRAMUSCULAR | Status: AC
  Administered 2021-12-26: 10 mg

## 2021-12-26 MED ORDER — ATORVASTATIN CALCIUM 10 MG PO TABS: 10.0000 mg | ORAL_TABLET | Freq: Every day | ORAL | 3 refills | Status: DC

## 2021-12-26 NOTE — Telephone Encounter (Signed)
LM requesting call back to discuss medications.  ?PCP recommending re-starting medication for high cholesterol (see labs letter).  ?

## 2021-12-26 NOTE — Telephone Encounter (Signed)
Pt received lab results from [facility] stating for he to contact office to discuss putting her on another medication  ? ?Please advise  ?

## 2021-12-26 NOTE — Patient Instructions (Signed)

## 2021-12-26 NOTE — Addendum Note (Signed)
Addended by: Lamar Blinks C on: 12/26/2021 05:09 PM ? ? Modules accepted: Orders ? ?

## 2021-12-26 NOTE — Telephone Encounter (Signed)
Spoke with patient regarding labs and PCP recommendations.  ?Patient was unable to tolerate side effects of Pravachol, however is willing to try a different statin.  ?Patient already has 6 month f/u scheduled.  ? ?Please send medication to [facility] in [place].  ?

## 2021-12-27 NOTE — Progress Notes (Signed)
Subjective:  ? ?Patient ID: Erica Navarro, female   DOB: 71 y.o.   MRN: [id number]  ? ?HPI ?Patient states she is developed a lot of pain in the bottom of her left arch and heel over the last few months.  Patient states it hurts when she is on it for long periods of time and she does work full-time and does not smoke likes to be active ? ? ?Review of Systems  ?All other systems reviewed and are negative. ? ? ?   ?Objective:  ?Physical Exam ?Vitals and nursing note reviewed.  ?Constitutional:   ?   Appearance: She is well-developed.  ?Pulmonary:  ?   Effort: Pulmonary effort is normal.  ?Musculoskeletal:     ?   General: Normal range of motion.  ?Skin: ?   General: Skin is warm.  ?Neurological:  ?   Mental Status: She is alert.  ?  ?Neurovascular status found to be intact muscle strength is adequate range of motion adequate with moderate depression of the arch and exquisite discomfort in the plantar fascial left at the insertion tendon calcaneus with good digital perfusion well oriented x3 ? ?   ?Assessment:  ?Acute plantar fasciitis left with inflammation fluid around the medial band ? ?   ?Plan:  ?H&P reviewed condition sterile prep and injected the fascia at insertion calcaneus 3 mg Kenalog 5 mg Xylocaine applied fascial brace with instructions on elevating her arch due to instability to both take pressure off the plantar fascia and provide for support and reappoint to recheck ? ?X-rays indicate moderate depression of the arch spur formation no indication stress fracture arthritis ?   ? ? ?

## 2022-01-23 NOTE — Telephone Encounter (Signed)
Prolia VOB initiated via [email or web address] ? ?Last OV: 12/16/21 ?Next OV:  ?Last Prolia inj: 09/07/21 ?Next Prolia inj DUE: 03/08/22 ? ?

## 2022-01-30 ENCOUNTER — Other Ambulatory Visit (HOSPITAL_COMMUNITY): Payer: Self-pay | Admitting: Surgery

## 2022-01-30 ENCOUNTER — Other Ambulatory Visit: Payer: Self-pay | Admitting: Surgery

## 2022-01-30 DIAGNOSIS — E213 Hyperparathyroidism, unspecified: Secondary | ICD-10-CM

## 2022-02-07 NOTE — Telephone Encounter (Signed)
Prior auth required for PROLIA  PA PROCESS DETAILS: PA is required. Call [phone] to initiate over the phone, or to request a PA form be faxed to your office. PA may also be initiated via web by accessing [email or web address]. Please clearly indicate on the PA that the request is for the medical benefit.    

## 2022-02-13 ENCOUNTER — Encounter (HOSPITAL_COMMUNITY)
Admission: RE | Admit: 2022-02-13 | Discharge: 2022-02-13 | Disposition: A | Discharge status: Home / Self Care | Payer: Medicare (Managed Care) | Source: Ambulatory Visit | Attending: Surgery | Admitting: Surgery

## 2022-02-13 DIAGNOSIS — E213 Hyperparathyroidism, unspecified: Secondary | ICD-10-CM | POA: Insufficient documentation

## 2022-02-13 MED ORDER — TECHNETIUM TC 99M SESTAMIBI - CARDIOLITE
25.2000 | Freq: Once | INTRAVENOUS | Status: AC | PRN
  Administered 2022-02-13: 25.2 via INTRAVENOUS

## 2022-02-17 ENCOUNTER — Ambulatory Visit: Payer: Self-pay | Admitting: Surgery

## 2022-02-17 NOTE — Progress Notes (Signed)
Nuclear med sestamibi now indicates a signal at the right inferior position consistent with adenoma.  Will plan a minimally invasive approach to this gland as an out-patient surgery. ? ?Claiborne Billings - I will submit order for the schedulers to contact the patient. ? ?tmg ? ?[name], MD ?[facility] ?A [facility] practice ?Office: [phone] ?

## 2022-02-20 NOTE — Telephone Encounter (Signed)
PA# [id number] ?Valid: 07/19/21-07/19/22 ? ? ?

## 2022-02-20 NOTE — Telephone Encounter (Signed)
Pt ready for scheduling on or after 03/08/22 ? ?Out-of-pocket cost due at time of visit: $301 ? ?Primary: [employer] Medicare ?Prolia co-insurance: 20% (approximately $276) ?Admin fee co-insurance: 20% (approximately $25) ? ?Secondary: n/a ?Prolia co-insurance:  ?Admin fee co-insurance:  ? ?Deductible: does not apply ? ?Prior Auth: APPROVED ?PA# [id number] ?Valid: 07/19/21-07/19/22 ? ?** This summary of benefits is an estimation of the patient's out-of-pocket cost. Exact cost may vary based on individual plan coverage.  ? ?

## 2022-02-22 NOTE — Telephone Encounter (Signed)
Appt scheduled

## 2022-03-06 ENCOUNTER — Encounter: Payer: Self-pay | Admitting: Surgery

## 2022-03-06 NOTE — H&P (Signed)
? ? ? ?PROVIDER: Lui Bellis Charlotta Newton, MD ? ? ?Chief Complaint: Follow-up (Recurrent/persistent primary hyperparathyroidism) ? ?History of Present Illness: ? ?Patient is referred by Dr. Philemon Kingdom for recurrent or persistent primary hyperparathyroidism. Patient is known to my practice from previous evaluation. In June 2020 she underwent a left inferior minimally invasive parathyroidectomy. Prior to that surgery she had calcium levels as high as 11.3 and an intact PTH level of 82. Postoperatively her calcium level fell to 10.7 and her intact PTH level fell to 63. However, recent testing in February 2023 shows a calcium level of 11.1 and an intact PTH level of 89. Patient is largely asymptomatic. She had had some joint and muscle discomfort while taking a statin. Once the statin was discontinued, her symptoms resolved. Her energy level is good. She had no complications following her previous surgery. Patient did have an ultrasound performed in September 2022. This shows a stable nodule in the right thyroid lobe measuring 3.6 cm in maximum dimension. This nodule had undergone previous fine-needle aspiration biopsy with benign results. No additional imaging has been performed.  ? ?Review of Systems: ?A complete review of systems was obtained from the patient. I have reviewed this information and discussed as appropriate with the patient. See HPI as well for other ROS. ? ?Review of Systems  ?Constitutional: Negative.  ?HENT: Negative.  ?Eyes: Negative.  ?Respiratory: Negative.  ?Cardiovascular: Negative.  ?Gastrointestinal: Negative.  ?Genitourinary: Negative.  ?Musculoskeletal: Negative.  ?Skin: Negative.  ?Neurological: Negative.  ?Endo/Heme/Allergies: Negative.  ?Psychiatric/Behavioral: Negative.  ? ? ?Medical History: ?History reviewed. No pertinent past medical history. ? ?Patient Active Problem List  ?Diagnosis  ? Primary hyperparathyroidism (CMS-HCC)  ? Status post parathyroidectomy (CMS-HCC)  ? ?Past  Surgical History:  ?Procedure Laterality Date  ? thyroid  ? ? ?Allergies  ?Allergen Reactions  ? Lisinopril Swelling  ? ?Current Outpatient Medications on File Prior to Visit  ?Medication Sig Dispense Refill  ? amLODIPine (NORVASC) 10 MG tablet Take 10 mg by mouth once daily  ? aspirin 81 MG EC tablet Take 81 mg by mouth once daily  ? atorvastatin (LIPITOR) 10 MG tablet TAKE 1 TABLET (10 MG TOTAL) BY MOUTH DAILY. CAN START WITH 1/2 TABLET EVERY OTHER DAY IF DESIRED  ? cholecalciferol (VITAMIN D3) 1000 unit capsule Take by mouth  ? ?No current facility-administered medications on file prior to visit.  ? ?Family History  ?Problem Relation Age of Onset  ? High blood pressure (Hypertension) Sister  ? Hyperlipidemia (Elevated cholesterol) Sister  ? ? ?Social History  ? ?Tobacco Use  ?Smoking Status Never  ?Smokeless Tobacco Never  ? ? ?Social History  ? ?Socioeconomic History  ? Marital status: Single  ?Tobacco Use  ? Smoking status: Never  ? Smokeless tobacco: Never  ?Substance and Sexual Activity  ? Alcohol use: Never  ? Drug use: Never  ? ?Objective:  ? ?Vitals:  ?BP: 130/78  ?Pulse: 83  ?Temp: 36.4 ?C (97.6 ?F)  ?SpO2: 96%  ?Weight: 76.5 kg (168 lb 9.6 oz)  ?Height: 160 cm ('5\' 3"'$ )  ? ?Body mass index is 29.87 kg/m?. ? ?Physical Exam  ? ?GENERAL APPEARANCE ?Development: normal ?Nutritional status: normal ?Gross deformities: none ? ?SKIN ?Rash, lesions, ulcers: none ?Induration, erythema: none ?Nodules: none palpable ? ?EYES ?Conjunctiva and lids: normal ?Pupils: equal and reactive ?Iris: normal bilaterally ? ?EARS, NOSE, MOUTH, THROAT ?External ears: no lesion or deformity ?External nose: no lesion or deformity ?Hearing: grossly normal ?Due to Covid-19 pandemic, patient is wearing a mask. ? ?  NECK ?Symmetric: yes ?Trachea: midline ?Thyroid: There is a well-healed left anterior cervical incision. Palpation of the left thyroid lobe shows no significant nodularity or masses. Palpation on the right shows a nodule in the  right thyroid lobe which is relatively soft and mobile and likely measures less than 3 cm in size. There is no associated lymphadenopathy. ? ?CHEST ?Respiratory effort: normal ?Retraction or accessory muscle use: no ?Breath sounds: normal bilaterally ?Rales, rhonchi, wheeze: none ? ?CARDIOVASCULAR ?Auscultation: regular rhythm, normal rate ?Murmurs: none ?Pulses: radial pulse 2+ palpable ?Lower extremity edema: none ? ?MUSCULOSKELETAL ?Station and gait: normal ?Digits and nails: no clubbing or cyanosis ?Muscle strength: grossly normal all extremities ?Range of motion: grossly normal all extremities ?Deformity: none ? ?LYMPHATIC ?Cervical: none palpable ?Supraclavicular: none palpable ? ?PSYCHIATRIC ?Oriented to person, place, and time: yes ?Mood and affect: normal for situation ?Judgment and insight: appropriate for situation ? ? ?Assessment and Plan:  ? ?Hyperparathyroidism (CMS-HCC) ? ?Primary hyperparathyroidism (CMS-HCC) ? ?Status post parathyroidectomy (CMS-HCC) ? ?Patient is referred by Dr. Philemon Kingdom for surgical evaluation and management of persistent or recurrent primary hyperparathyroidism. ? ?Patient has biochemical evidence of primary hyperparathyroidism. She has undergone a previous left-sided parathyroidectomy. Recent ultrasound did not show any enlarged parathyroid tissue but did demonstrate a stable right thyroid nodule. Today the patient and I discussed further diagnostic studies to evaluate for hyperparathyroidism. I would like to repeat her nuclear medicine parathyroid scan. If that localizes a second adenoma, then we will recommend proceeding with minimally invasive parathyroid surgery. If that study fails to identify an adenoma, then the patient and I will discuss further options for management. This may include a traditional neck exploration with identification of all 3 remaining parathyroid glands for possible parathyroidectomy. We may also consider surgical management of the right sided  thyroid nodule. The patient and I discussed this briefly today. ? ?Patient will undergo nuclear medicine parathyroid scan. We will contact her with those results and make a decision regarding further management at that time.  ? ?[name], MD ?[facility] ?A [facility] practice ?Office: [phone] ? ?

## 2022-03-08 ENCOUNTER — Encounter (HOSPITAL_COMMUNITY): Payer: Self-pay

## 2022-03-08 ENCOUNTER — Encounter (HOSPITAL_COMMUNITY): Admission: RE | Admit: 2022-03-08 | Payer: Medicare (Managed Care) | Source: Ambulatory Visit

## 2022-03-09 ENCOUNTER — Ambulatory Visit: Payer: Medicare (Managed Care)

## 2022-03-09 DIAGNOSIS — M818 Other osteoporosis without current pathological fracture: Secondary | ICD-10-CM

## 2022-03-09 MED ORDER — DENOSUMAB 60 MG/ML ~~LOC~~ SOSY
60.0000 mg | PREFILLED_SYRINGE | Freq: Once | SUBCUTANEOUS | Status: AC
  Administered 2022-03-09: 60 mg via SUBCUTANEOUS

## 2022-03-09 NOTE — Progress Notes (Signed)
Patient verbally confirmed name, date of birth, and correct medication to be administered. Prolia injection administered and pt tolerated well.  

## 2022-03-10 ENCOUNTER — Ambulatory Visit: Payer: Medicare (Managed Care)

## 2022-03-10 ENCOUNTER — Ambulatory Visit: Admit: 2022-03-10 | Payer: Medicare (Managed Care) | Admitting: Surgery

## 2022-03-10 SURGERY — PARATHYROIDECTOMY
Anesthesia: General | Laterality: Right

## 2022-03-26 ENCOUNTER — Other Ambulatory Visit: Payer: Self-pay | Admitting: Family Medicine

## 2022-03-26 DIAGNOSIS — E782 Mixed hyperlipidemia: Secondary | ICD-10-CM

## 2022-03-28 NOTE — Patient Instructions (Signed)
DUE TO COVID-19 ONLY TWO VISITORS  (aged 73 and older)  ARE ALLOWED TO COME WITH YOU AND STAY IN THE WAITING ROOM ONLY DURING PRE OP AND PROCEDURE.   **NO VISITORS ARE ALLOWED IN THE SHORT STAY AREA OR RECOVERY ROOM!!**  IF YOU WILL BE ADMITTED INTO THE HOSPITAL YOU ARE ALLOWED ONLY FOUR SUPPORT PEOPLE DURING VISITATION HOURS ONLY (7 AM -8PM)   The support person(s) must pass our screening, gel in and out, and wear a mask at all times, including in the patient's room. Patients must also wear a mask when staff or their support person are in the room. Visitors GUEST BADGE MUST BE WORN VISIBLY  One adult visitor may remain with you overnight and MUST be in the room by 8 P.M.     Your procedure is scheduled on: 04/06/22   Report to [facility] Main Entrance    Report to short stay at : 5:15 AM   Call this number if you have problems the morning of surgery [phone]   Do not eat food :After Midnight.   After Midnight you may have the following liquids until: 4:30 AM DAY OF SURGERY  Water Black Coffee (sugar ok, NO MILK/CREAM OR CREAMERS)  Tea (sugar ok, NO MILK/CREAM OR CREAMERS) regular and decaf                             Plain Jell-O (NO RED)                                           Fruit ices (not with fruit pulp, NO RED)                                     Popsicles (NO RED)                                                                  Juice: apple, WHITE grape, WHITE cranberry Sports drinks like Gatorade (NO RED) Clear broth(vegetable,chicken,beef)  Oral Hygiene is also important to reduce your risk of infection.                                    Remember - BRUSH YOUR TEETH THE MORNING OF SURGERY WITH YOUR REGULAR TOOTHPASTE   Do NOT smoke after Midnight   Take these medicines the morning of surgery with A SIP OF WATER: amlodipine.  DO NOT TAKE ANY ORAL DIABETIC MEDICATIONS DAY OF YOUR SURGERY  Bring CPAP mask and tubing day of surgery.                               You may not have any metal on your body including hair pins, jewelry, and body piercing             Do not wear make-up, lotions, powders, perfumes/cologne, or deodorant  Do not wear nail polish including gel  and S&S, artificial/acrylic nails, or any other type of covering on natural nails including finger and toenails. If you have artificial nails, gel coating, etc. that needs to be removed by a nail salon please have this removed prior to surgery or surgery may need to be canceled/ delayed if the surgeon/ anesthesia feels like they are unable to be safely monitored.   Do not shave  48 hours prior to surgery.   Do not bring valuables to the hospital. [place].   Contacts, dentures or bridgework may not be worn into surgery.   Bring small overnight bag day of surgery.   DO NOT [place]. PHARMACY WILL DISPENSE MEDICATIONS LISTED ON YOUR MEDICATION LIST TO YOU DURING YOUR ADMISSION [place]!    Patients discharged on the day of surgery will not be allowed to drive home.  Someone NEEDS to stay with you for the first 24 hours after anesthesia.   Special Instructions: Bring a copy of your healthcare power of attorney and living will documents         the day of surgery if you haven't scanned them before.              Please read over the following fact sheets you were given: IF YOU HAVE QUESTIONS ABOUT YOUR PRE-OP INSTRUCTIONS PLEASE CALL [phone]     [facility] - Preparing for Surgery Before surgery, you can play an important role.  Because skin is not sterile, your skin needs to be as free of germs as possible.  You can reduce the number of germs on your skin by washing with CHG (chlorahexidine gluconate) soap before surgery.  CHG is an antiseptic cleaner which kills germs and bonds with the skin to continue killing germs even after washing. Please DO NOT use if you have an allergy to CHG  or antibacterial soaps.  If your skin becomes reddened/irritated stop using the CHG and inform your nurse when you arrive at Short Stay. Do not shave (including legs and underarms) for at least 48 hours prior to the first CHG shower.  You may shave your face/neck. Please follow these instructions carefully:  1.  Shower with CHG Soap the night before surgery and the  morning of Surgery.  2.  If you choose to wash your hair, wash your hair first as usual with your  normal  shampoo.  3.  After you shampoo, rinse your hair and body thoroughly to remove the  shampoo.                           4.  Use CHG as you would any other liquid soap.  You can apply chg directly  to the skin and wash                       Gently with a scrungie or clean washcloth.  5.  Apply the CHG Soap to your body ONLY FROM THE NECK DOWN.   Do not use on face/ open                           Wound or open sores. Avoid contact with eyes, ears mouth and genitals (private parts).  Wash face,  Genitals (private parts) with your normal soap.             6.  Wash thoroughly, paying special attention to the area where your surgery  will be performed.  7.  Thoroughly rinse your body with warm water from the neck down.  8.  DO NOT shower/wash with your normal soap after using and rinsing off  the CHG Soap.                9.  Pat yourself dry with a clean towel.            10.  Wear clean pajamas.            11.  Place clean sheets on your bed the night of your first shower and do not  sleep with pets. Day of Surgery : Do not apply any lotions/deodorants the morning of surgery.  Please wear clean clothes to the hospital/surgery center.  FAILURE TO FOLLOW THESE INSTRUCTIONS MAY RESULT IN THE CANCELLATION OF YOUR SURGERY PATIENT SIGNATURE_________________________________  NURSE SIGNATURE__________________________________  ________________________________________________________________________

## 2022-03-29 ENCOUNTER — Encounter (HOSPITAL_COMMUNITY): Payer: Self-pay

## 2022-03-29 ENCOUNTER — Other Ambulatory Visit: Payer: Self-pay

## 2022-03-29 ENCOUNTER — Encounter (HOSPITAL_COMMUNITY)
Admission: RE | Admit: 2022-03-29 | Discharge: 2022-03-29 | Disposition: A | Discharge status: Home / Self Care | Payer: Medicare (Managed Care) | Source: Ambulatory Visit | Attending: Surgery | Admitting: Surgery

## 2022-03-29 VITALS — BP 148/76 | HR 68 | Temp 98.3°F | Ht 63.0 in | Wt 162.0 lb

## 2022-03-29 DIAGNOSIS — Z01818 Encounter for other preprocedural examination: Secondary | ICD-10-CM | POA: Diagnosis present

## 2022-03-29 DIAGNOSIS — I10 Essential (primary) hypertension: Secondary | ICD-10-CM | POA: Insufficient documentation

## 2022-03-29 HISTORY — DX: Unspecified osteoarthritis, unspecified site: M19.90

## 2022-03-29 LAB — BASIC METABOLIC PANEL
Anion gap: 7 (ref 5–15)
BUN: 15 mg/dL (ref 8–23)
CO2: 26 mmol/L (ref 22–32)
Calcium: 10.3 mg/dL (ref 8.9–10.3)
Chloride: 106 mmol/L (ref 98–111)
Creatinine, Ser: 0.49 mg/dL (ref 0.44–1.00)
GFR, Estimated: >60 mL/min (ref >60)
Glucose, Bld: 107 mg/dL — ABNORMAL HIGH (ref 70–99)
Potassium: 4.1 mmol/L (ref 3.5–5.1)
Sodium: 139 mmol/L (ref 135–145)

## 2022-03-29 LAB — CBC
HCT: 40.4 % (ref 36.0–46.0)
Hemoglobin: 13 g/dL (ref 12.0–15.0)
MCH: 28.8 pg (ref 26.0–34.0)
MCHC: 32.2 g/dL (ref 30.0–36.0)
MCV: 89.4 fL (ref 80.0–100.0)
Platelets: 321 10*3/uL (ref 150–400)
RBC: 4.52 MIL/uL (ref 3.87–5.11)
RDW: 13.8 % (ref 11.5–15.5)
WBC: 6.5 10*3/uL (ref 4.0–10.5)
nRBC: 0 % (ref 0.0–0.2)

## 2022-03-29 NOTE — Progress Notes (Signed)
For Short Stay: [place] appointment date: Date of COVID positive in last 12 days:  Bowel Prep reminder:   For Anesthesia: PCP - Dr. [name] Copland. LOV: 12/19/21 Cardiologist -   Chest x-ray -  EKG -  Stress Test -  ECHO -  Cardiac Cath -  Pacemaker/ICD device last checked: Pacemaker orders received: Device Rep notified:  Spinal Cord Stimulator:  Sleep Study -  CPAP -   Fasting Blood Sugar -  Checks Blood Sugar _____ times a day Date and result of last Hgb A1c-  Blood Thinner Instructions: Aspirin Instructions: Last Dose:  Activity level: Can go up a flight of stairs and activities of daily living without stopping and without chest pain and/or shortness of breath   Able to exercise without chest pain and/or shortness of breath   Unable to go up a flight of stairs without chest pain and/or shortness of breath     Anesthesia review: Hx: HTN  Patient denies shortness of breath, fever, cough and chest pain at PAT appointment   Patient verbalized understanding of instructions that were given to them at the PAT appointment. Patient was also instructed that they will need to review over the PAT instructions again at home before surgery.

## 2022-04-04 ENCOUNTER — Encounter (HOSPITAL_COMMUNITY): Payer: Self-pay | Admitting: Surgery

## 2022-04-04 NOTE — H&P (Signed)
PROVIDER: Malaia Buchta Charlotta Newton, MD   Chief Complaint: Follow-up (Recurrent/persistent primary hyperparathyroidism)  History of Present Illness:  Patient is referred by Dr. Philemon Kingdom for recurrent or persistent primary hyperparathyroidism. Patient is known to my practice from previous evaluation. In June 2020 she underwent a left inferior minimally invasive parathyroidectomy. Prior to that surgery she had calcium levels as high as 11.3 and an intact PTH level of 82. Postoperatively her calcium level fell to 10.7 and her intact PTH level fell to 63. However, recent testing in February 2023 shows a calcium level of 11.1 and an intact PTH level of 89. Patient is largely asymptomatic. She had had some joint and muscle discomfort while taking a statin. Once the statin was discontinued, her symptoms resolved. Her energy level is good. She had no complications following her previous surgery. Patient did have an ultrasound performed in September 2022. This shows a stable nodule in the right thyroid lobe measuring 3.6 cm in maximum dimension. This nodule had undergone previous fine-needle aspiration biopsy with benign results. No additional imaging has been performed.   Review of Systems: A complete review of systems was obtained from the patient. I have reviewed this information and discussed as appropriate with the patient. See HPI as well for other ROS.  Review of Systems  Constitutional: Negative.  HENT: Negative.  Eyes: Negative.  Respiratory: Negative.  Cardiovascular: Negative.  Gastrointestinal: Negative.  Genitourinary: Negative.  Musculoskeletal: Negative.  Skin: Negative.  Neurological: Negative.  Endo/Heme/Allergies: Negative.  Psychiatric/Behavioral: Negative.    Medical History: History reviewed. No pertinent past medical history.  Patient Active Problem List  Diagnosis   Primary hyperparathyroidism (CMS-HCC)   Status post parathyroidectomy (CMS-HCC)   Past  Surgical History:  Procedure Laterality Date   thyroid    Allergies  Allergen Reactions   Lisinopril Swelling   Current Outpatient Medications on File Prior to Visit  Medication Sig Dispense Refill   amLODIPine (NORVASC) 10 MG tablet Take 10 mg by mouth once daily   aspirin 81 MG EC tablet Take 81 mg by mouth once daily   atorvastatin (LIPITOR) 10 MG tablet TAKE 1 TABLET (10 MG TOTAL) BY MOUTH DAILY. CAN START WITH 1/2 TABLET EVERY OTHER DAY IF DESIRED   cholecalciferol (VITAMIN D3) 1000 unit capsule Take by mouth   No current facility-administered medications on file prior to visit.   Family History  Problem Relation Age of Onset   High blood pressure (Hypertension) Sister   Hyperlipidemia (Elevated cholesterol) Sister    Social History   Tobacco Use  Smoking Status Never  Smokeless Tobacco Never    Social History   Socioeconomic History   Marital status: Single  Tobacco Use   Smoking status: Never   Smokeless tobacco: Never  Substance and Sexual Activity   Alcohol use: Never   Drug use: Never   Objective:   Vitals:  BP: 130/78  Pulse: 83  Temp: 36.4 C (97.6 F)  SpO2: 96%  Weight: 76.5 kg (168 lb 9.6 oz)  Height: 160 cm ('5\' 3"'$ )   Body mass index is 29.87 kg/m.  Physical Exam   GENERAL APPEARANCE Development: normal Nutritional status: normal Gross deformities: none  SKIN Rash, lesions, ulcers: none Induration, erythema: none Nodules: none palpable  EYES Conjunctiva and lids: normal Pupils: equal and reactive Iris: normal bilaterally  EARS, NOSE, MOUTH, THROAT External ears: no lesion or deformity External nose: no lesion or deformity Hearing: grossly normal Due to Covid-19 pandemic, patient is wearing a mask.  NECK Symmetric: yes Trachea: midline Thyroid: There is a well-healed left anterior cervical incision. Palpation of the left thyroid lobe shows no significant nodularity or masses. Palpation on the right shows a nodule in the  right thyroid lobe which is relatively soft and mobile and likely measures less than 3 cm in size. There is no associated lymphadenopathy.  CHEST Respiratory effort: normal Retraction or accessory muscle use: no Breath sounds: normal bilaterally Rales, rhonchi, wheeze: none  CARDIOVASCULAR Auscultation: regular rhythm, normal rate Murmurs: none Pulses: radial pulse 2+ palpable Lower extremity edema: none  MUSCULOSKELETAL Station and gait: normal Digits and nails: no clubbing or cyanosis Muscle strength: grossly normal all extremities Range of motion: grossly normal all extremities Deformity: none  LYMPHATIC Cervical: none palpable Supraclavicular: none palpable  PSYCHIATRIC Oriented to person, place, and time: yes Mood and affect: normal for situation Judgment and insight: appropriate for situation   Assessment and Plan:   Primary hyperparathyroidism (CMS-HCC)  Status post parathyroidectomy (CMS-HCC)  Patient is referred by Dr. Philemon Kingdom for surgical evaluation and management of persistent or recurrent primary hyperparathyroidism.  Patient has biochemical evidence of primary hyperparathyroidism. She has undergone a previous left-sided parathyroidectomy. Recent ultrasound did not show any enlarged parathyroid tissue but did demonstrate a stable right thyroid nodule. Today the patient and I discussed further diagnostic studies to evaluate for hyperparathyroidism. I would like to repeat her nuclear medicine parathyroid scan. If that localizes a second adenoma, then we will recommend proceeding with minimally invasive parathyroid surgery. If that study fails to identify an adenoma, then the patient and I will discuss further options for management. This may include a traditional neck exploration with identification of all 3 remaining parathyroid glands for possible parathyroidectomy. We may also consider surgical management of the right sided thyroid nodule. The patient and  I discussed this briefly today.  [name], MD [facility] A [place] practice Office: [phone]

## 2022-04-05 NOTE — Anesthesia Preprocedure Evaluation (Addendum)
Anesthesia Evaluation  Patient identified by MRN, date of birth, ID band Patient awake    Reviewed: Allergy & Precautions, NPO status , Patient's Chart, lab work & pertinent test results  Airway Mallampati: II  TM Distance: >3 FB Neck ROM: Full    Dental no notable dental hx. (+) Teeth Intact, Dental Advisory Given   Pulmonary    Pulmonary exam normal breath sounds clear to auscultation       Cardiovascular hypertension, Normal cardiovascular exam Rhythm:Regular Rate:Normal     Neuro/Psych negative neurological ROS  negative psych ROS   GI/Hepatic Neg liver ROS,   Endo/Other  Primary hyperparathyroidism  Renal/GU Lab Results      Component                Value               Date                      CREATININE               0.49                03/29/2022                BUN                      15                  03/29/2022                NA                       139                 03/29/2022                K                        4.1                 03/29/2022                            Musculoskeletal  (+) Arthritis ,   Abdominal   Peds  Hematology Lab Results      Component                Value               Date                      WBC                      6.5                 03/29/2022                HGB                      13.0                03/29/2022                HCT  40.4                03/29/2022                MCV                      89.4                03/29/2022                PLT                      321                 03/29/2022              Anesthesia Other Findings All: latex, pravachol, zestril  Reproductive/Obstetrics                            Anesthesia Physical Anesthesia Plan  ASA: 2  Anesthesia Plan: General   Post-op Pain Management:    Induction: Intravenous  PONV Risk Score and Plan: 3 and Treatment may vary due to age or  medical condition, Midazolam and Ondansetron  Airway Management Planned: Oral ETT  Additional Equipment: None  Intra-op Plan:   Post-operative Plan: Extubation in OR  Informed Consent: I have reviewed the patients History and Physical, chart, labs and discussed the procedure including the risks, benefits and alternatives for the proposed anesthesia with the patient or authorized representative who has indicated his/her understanding and acceptance.     Dental advisory given  Plan Discussed with:   Anesthesia Plan Comments:        Anesthesia Quick Evaluation

## 2022-04-06 ENCOUNTER — Other Ambulatory Visit: Payer: Self-pay

## 2022-04-06 ENCOUNTER — Encounter (HOSPITAL_COMMUNITY): Payer: Self-pay | Admitting: Surgery

## 2022-04-06 ENCOUNTER — Ambulatory Visit (HOSPITAL_COMMUNITY)
Admission: RE | Admit: 2022-04-06 | Discharge: 2022-04-06 | Disposition: A | Discharge status: Home / Self Care | Payer: Medicare (Managed Care) | Attending: Surgery | Admitting: Surgery

## 2022-04-06 ENCOUNTER — Ambulatory Visit (HOSPITAL_COMMUNITY): Payer: Medicare (Managed Care) | Admitting: Anesthesiology

## 2022-04-06 ENCOUNTER — Ambulatory Visit (HOSPITAL_BASED_OUTPATIENT_CLINIC_OR_DEPARTMENT_OTHER): Payer: Medicare (Managed Care) | Admitting: Anesthesiology

## 2022-04-06 ENCOUNTER — Encounter (HOSPITAL_COMMUNITY)
Admission: RE | Disposition: A | Discharge status: Home / Self Care | Payer: Self-pay | Source: Home / Self Care | Attending: Surgery

## 2022-04-06 DIAGNOSIS — I10 Essential (primary) hypertension: Secondary | ICD-10-CM | POA: Diagnosis not present

## 2022-04-06 DIAGNOSIS — D351 Benign neoplasm of parathyroid gland: Secondary | ICD-10-CM | POA: Insufficient documentation

## 2022-04-06 DIAGNOSIS — E210 Primary hyperparathyroidism: Secondary | ICD-10-CM

## 2022-04-06 HISTORY — PX: PARATHYROIDECTOMY: SHX19

## 2022-04-06 SURGERY — PARATHYROIDECTOMY
Anesthesia: General | Laterality: Right

## 2022-04-06 MED ORDER — CHLORHEXIDINE GLUCONATE CLOTH 2 % EX PADS: 6.0000 | MEDICATED_PAD | Freq: Once | CUTANEOUS | Status: DC

## 2022-04-06 MED ORDER — ORAL CARE MOUTH RINSE: 15.0000 mL | Freq: Once | OROMUCOSAL | Status: AC

## 2022-04-06 MED ORDER — LACTATED RINGERS IV SOLN: INTRAVENOUS | Status: DC

## 2022-04-06 MED ORDER — BUPIVACAINE HCL (PF) 0.25 % IJ SOLN
INTRAMUSCULAR | Status: AC
  Filled 2022-04-06: qty 30

## 2022-04-06 MED ORDER — HEMOSTATIC AGENTS (NO CHARGE) OPTIME
TOPICAL | Status: DC | PRN
  Administered 2022-04-06: 1

## 2022-04-06 MED ORDER — CEFAZOLIN SODIUM-DEXTROSE 2-4 GM/100ML-% IV SOLN
2.0000 g | INTRAVENOUS | Status: AC
  Administered 2022-04-06: 2 g via INTRAVENOUS
  Filled 2022-04-06: qty 100

## 2022-04-06 MED ORDER — FENTANYL CITRATE PF 50 MCG/ML IJ SOSY: 25.0000 ug | PREFILLED_SYRINGE | INTRAMUSCULAR | Status: DC | PRN

## 2022-04-06 MED ORDER — EPHEDRINE SULFATE (PRESSORS) 50 MG/ML IJ SOLN
INTRAMUSCULAR | Status: DC | PRN
  Administered 2022-04-06: 5 mg via INTRAVENOUS

## 2022-04-06 MED ORDER — ONDANSETRON HCL 4 MG/2ML IJ SOLN: 4.0000 mg | Freq: Once | INTRAMUSCULAR | Status: DC | PRN

## 2022-04-06 MED ORDER — ROCURONIUM BROMIDE 10 MG/ML (PF) SYRINGE
PREFILLED_SYRINGE | INTRAVENOUS | Status: AC
  Filled 2022-04-06: qty 10

## 2022-04-06 MED ORDER — ROCURONIUM BROMIDE 100 MG/10ML IV SOLN
INTRAVENOUS | Status: DC | PRN
  Administered 2022-04-06: 50 mg via INTRAVENOUS

## 2022-04-06 MED ORDER — CHLORHEXIDINE GLUCONATE 0.12 % MT SOLN
15.0000 mL | Freq: Once | OROMUCOSAL | Status: AC
  Administered 2022-04-06: 15 mL via OROMUCOSAL

## 2022-04-06 MED ORDER — FENTANYL CITRATE (PF) 250 MCG/5ML IJ SOLN
INTRAMUSCULAR | Status: AC
  Filled 2022-04-06: qty 5

## 2022-04-06 MED ORDER — MIDAZOLAM HCL 2 MG/2ML IJ SOLN
INTRAMUSCULAR | Status: AC
  Filled 2022-04-06: qty 2

## 2022-04-06 MED ORDER — ACETAMINOPHEN 10 MG/ML IV SOLN: 1000.0000 mg | Freq: Once | INTRAVENOUS | Status: DC | PRN

## 2022-04-06 MED ORDER — LIDOCAINE HCL (PF) 2 % IJ SOLN
INTRAMUSCULAR | Status: AC
  Filled 2022-04-06: qty 5

## 2022-04-06 MED ORDER — 0.9 % SODIUM CHLORIDE (POUR BTL) OPTIME
TOPICAL | Status: DC | PRN
  Administered 2022-04-06: 1000 mL

## 2022-04-06 MED ORDER — EPHEDRINE 5 MG/ML INJ
INTRAVENOUS | Status: AC
  Filled 2022-04-06: qty 5

## 2022-04-06 MED ORDER — SUGAMMADEX SODIUM 200 MG/2ML IV SOLN
INTRAVENOUS | Status: DC | PRN
  Administered 2022-04-06: 200 mg via INTRAVENOUS

## 2022-04-06 MED ORDER — MIDAZOLAM HCL 5 MG/5ML IJ SOLN
INTRAMUSCULAR | Status: DC | PRN
  Administered 2022-04-06 (×2): 1 mg via INTRAVENOUS

## 2022-04-06 MED ORDER — ONDANSETRON HCL 4 MG/2ML IJ SOLN
INTRAMUSCULAR | Status: DC | PRN
  Administered 2022-04-06: 4 mg via INTRAVENOUS

## 2022-04-06 MED ORDER — PROPOFOL 10 MG/ML IV BOLUS
INTRAVENOUS | Status: AC
  Filled 2022-04-06: qty 20

## 2022-04-06 MED ORDER — DEXAMETHASONE SODIUM PHOSPHATE 10 MG/ML IJ SOLN
INTRAMUSCULAR | Status: AC
  Filled 2022-04-06: qty 1

## 2022-04-06 MED ORDER — PHENYLEPHRINE HCL (PRESSORS) 10 MG/ML IV SOLN
INTRAVENOUS | Status: DC | PRN
  Administered 2022-04-06 (×2): 80 ug via INTRAVENOUS

## 2022-04-06 MED ORDER — TRAMADOL HCL 50 MG PO TABS: 50.0000 mg | ORAL_TABLET | Freq: Four times a day (QID) | ORAL | 0 refills | Status: DC | PRN

## 2022-04-06 MED ORDER — FENTANYL CITRATE (PF) 100 MCG/2ML IJ SOLN
INTRAMUSCULAR | Status: DC | PRN
  Administered 2022-04-06: 25 ug via INTRAVENOUS
  Administered 2022-04-06: 100 ug via INTRAVENOUS

## 2022-04-06 MED ORDER — LIDOCAINE HCL (CARDIAC) PF 100 MG/5ML IV SOSY
PREFILLED_SYRINGE | INTRAVENOUS | Status: DC | PRN
  Administered 2022-04-06: 100 mg via INTRAVENOUS

## 2022-04-06 MED ORDER — ONDANSETRON HCL 4 MG/2ML IJ SOLN
INTRAMUSCULAR | Status: AC
  Filled 2022-04-06: qty 2

## 2022-04-06 MED ORDER — DEXAMETHASONE SODIUM PHOSPHATE 10 MG/ML IJ SOLN
INTRAMUSCULAR | Status: DC | PRN
  Administered 2022-04-06: 8 mg via INTRAVENOUS

## 2022-04-06 MED ORDER — BUPIVACAINE HCL 0.25 % IJ SOLN
INTRAMUSCULAR | Status: DC | PRN
  Administered 2022-04-06: 10 mL

## 2022-04-06 MED ORDER — PROPOFOL 10 MG/ML IV BOLUS
INTRAVENOUS | Status: DC | PRN
  Administered 2022-04-06: 120 mg via INTRAVENOUS

## 2022-04-06 SURGICAL SUPPLY — 38 items
ADH SKN CLS APL DERMABOND .7 (GAUZE/BANDAGES/DRESSINGS) ×1
APL PRP STRL LF DISP 70% ISPRP (MISCELLANEOUS) ×1
ATTRACTOMAT 16X20 MAGNETIC DRP (DRAPES) ×2 IMPLANT
BAG COUNTER SPONGE SURGICOUNT (BAG) ×2 IMPLANT
BAG SPNG CNTER NS LX DISP (BAG) ×1
BLADE SURG 15 STRL LF DISP TIS (BLADE) ×1 IMPLANT
BLADE SURG 15 STRL SS (BLADE) ×2
CHLORAPREP W/TINT 26 (MISCELLANEOUS) ×2 IMPLANT
CLIP TI MEDIUM 6 (CLIP) ×4 IMPLANT
CLIP TI WIDE RED SMALL 6 (CLIP) ×4 IMPLANT
COVER SURGICAL LIGHT HANDLE (MISCELLANEOUS) ×2 IMPLANT
DERMABOND ADVANCED (GAUZE/BANDAGES/DRESSINGS) ×1
DERMABOND ADVANCED .7 DNX12 (GAUZE/BANDAGES/DRESSINGS) ×1 IMPLANT
DRAPE LAPAROTOMY T 98X78 PEDS (DRAPES) ×2 IMPLANT
DRAPE UTILITY XL STRL (DRAPES) ×2 IMPLANT
ELECT REM PT RETURN 15FT ADLT (MISCELLANEOUS) ×2 IMPLANT
GAUZE 4X4 16PLY ~~LOC~~+RFID DBL (SPONGE) ×2 IMPLANT
GLOVE SURG ORTHO 8.0 STRL STRW (GLOVE) ×2 IMPLANT
GLOVE SURG SYN 7.5  E (GLOVE) ×6
GLOVE SURG SYN 7.5 E (GLOVE) ×3 IMPLANT
GLOVE SURG SYN 7.5 PF PI (GLOVE) ×3 IMPLANT
GOWN STRL REUS W/ TWL XL LVL3 (GOWN DISPOSABLE) ×3 IMPLANT
GOWN STRL REUS W/TWL XL LVL3 (GOWN DISPOSABLE) ×6
HEMOSTAT SURGICEL 2X4 FIBR (HEMOSTASIS) ×2 IMPLANT
ILLUMINATOR WAVEGUIDE N/F (MISCELLANEOUS) IMPLANT
KIT BASIN OR (CUSTOM PROCEDURE TRAY) ×2 IMPLANT
KIT TURNOVER KIT A (KITS) IMPLANT
NDL HYPO 25X1 1.5 SAFETY (NEEDLE) ×1 IMPLANT
NEEDLE HYPO 25X1 1.5 SAFETY (NEEDLE) ×2 IMPLANT
PACK BASIC VI WITH GOWN DISP (CUSTOM PROCEDURE TRAY) ×2 IMPLANT
PENCIL SMOKE EVACUATOR (MISCELLANEOUS) ×2 IMPLANT
SUT MNCRL AB 4-0 PS2 18 (SUTURE) ×2 IMPLANT
SUT VIC AB 3-0 SH 18 (SUTURE) ×2 IMPLANT
SYR BULB IRRIG 60ML STRL (SYRINGE) ×2 IMPLANT
SYR CONTROL 10ML LL (SYRINGE) ×2 IMPLANT
TOWEL OR 17X26 10 PK STRL BLUE (TOWEL DISPOSABLE) ×2 IMPLANT
TOWEL OR NON WOVEN STRL DISP B (DISPOSABLE) ×2 IMPLANT
TUBING CONNECTING 10 (TUBING) ×2 IMPLANT

## 2022-04-06 NOTE — Interval H&P Note (Signed)
History and Physical Interval Note:  04/06/2022 6:59 AM  Erica Navarro  has presented today for surgery, with the diagnosis of PRIMARY HYPERPARATHYROIDISM.  The various methods of treatment have been discussed with the patient and family. After consideration of risks, benefits and other options for treatment, the patient has consented to    Procedure(s): RIGHT INFERIOR PARATHYROIDECTOMY (Right) as a surgical intervention.    The patient's history has been reviewed, patient examined, no change in status, stable for surgery.  I have reviewed the patient's chart and labs.  Questions were answered to the patient's satisfaction.    [name], [place] [facility] A [place] practice Office: [place]

## 2022-04-06 NOTE — Transfer of Care (Signed)
Immediate Anesthesia Transfer of Care Note  Patient: Erica Navarro  Procedure(s) Performed: RIGHT INFERIOR PARATHYROIDECTOMY (Right)  Patient Location: PACU  Anesthesia Type:General  Level of Consciousness: oriented, drowsy and patient cooperative  Airway & Oxygen Therapy: Patient Spontanous Breathing and Patient connected to face mask oxygen  Post-op Assessment: Report given to RN and Post -op Vital signs reviewed and stable  Post vital signs: Reviewed and stable  Last Vitals:  Vitals Value Taken Time  BP 122/67 04/06/22 0838  Temp    Pulse 75 04/06/22 0837  Resp 16 04/06/22 0839  SpO2 100 % 04/06/22 0837  Vitals shown include unvalidated device data.  Last Pain:  Vitals:   04/06/22 0611  TempSrc:   PainSc: 0-No pain         Complications:  Encounter Notable Events  Notable Event Outcome Phase Comment  Difficult to intubate - expected  Intraprocedure Filed from anesthesia note documentation.

## 2022-04-06 NOTE — Discharge Instructions (Addendum)
[facility] - Dr. Todd Gerkin  THYROID & PARATHYROID SURGERY:  POST-OP INSTRUCTIONS  Always review the instruction sheet provided by the hospital nurse at discharge.  A prescription for pain medication may be sent to your pharmacy at the time of discharge.  Take your pain medication as prescribed.  If narcotic pain medicine is not needed, then you may take acetaminophen (Tylenol) or ibuprofen (Advil) as needed for pain or soreness.  Take your normal home medications as prescribed unless otherwise directed.  If you need a refill on your pain medication, please contact the office during regular business hours.  Prescriptions will not be processed by the office after 5:00PM or on weekends.  Start with a light diet upon arrival home, such as soup and crackers or toast.  Be sure to drink plenty of fluids.  Resume your normal diet the day after surgery.  Most patients will experience some swelling and bruising on the chest and neck area.  Ice packs will help for the first 48 hours after arriving home.  Swelling and bruising will take several days to resolve.   It is common to experience some constipation after surgery.  Increasing fluid intake and taking a stool softener (Colace) will usually help to prevent this problem.  A mild laxative (Milk of Magnesia or Miralax) should be taken according to package directions if there has been no bowel movement after 48 hours.  Dermabond glue covers your incision. This seals the wound and you may shower at any time. The Dermabond will remain in place for about a week.  You may gradually remove the glue when it loosens around the edges.  If you need to loosen the Dermabond for removal, apply a layer of Vaseline to the wound for 15 minutes and then remove with a Kleenex. Your sutures are under the skin and will not show - they will dissolve on their own.  You may resume light daily activities beginning the day after discharge (such as self-care,  walking, climbing stairs), gradually increasing activities as tolerated. You may have sexual intercourse when it is comfortable. Refrain from any heavy lifting or straining until approved by your doctor. You may drive when you no longer are taking prescription pain medication, you can comfortably wear a seatbelt, and you can safely maneuver your car and apply the brakes.  You will see your doctor in the office for a follow-up appointment approximately three weeks after your surgery.  Make sure that you call for this appointment within a day or two after you arrive home to insure a convenient appointment time. Please have any requested laboratory tests performed a few days prior to your office visit so that the results will be available at your follow up appointment.  WHEN TO CALL THE [facility] OFFICE: -- Fever greater than 101.5 -- Inability to urinate -- Nausea and/or vomiting - persistent -- Extreme swelling or bruising -- Continued bleeding from incision -- Increased pain, redness, or drainage from the incision -- Difficulty swallowing or breathing -- Muscle cramping or spasms -- Numbness or tingling in hands or around lips  The clinic staff is available to answer your questions during regular business hours.  Please don't hesitate to call and ask to speak to one of the nurses if you have concerns.  [facility] OFFICE: [phone] (24 hours)  Please sign up for MyChart accounts. This will allow you to communicate directly with my nurse or myself without having to call the office. It will also allow you   to view your test results. You will need to enroll in MyChart for my office ([facility]) and for the hospital ([place]).  Todd Gerkin, MD [facility] A [facility] 

## 2022-04-06 NOTE — Anesthesia Procedure Notes (Signed)
Procedure Name: Intubation Date/Time: 04/06/2022 7:32 AM  Performed by: Garrel Ridgel, CRNAPre-anesthesia Checklist: Patient identified, Emergency Drugs available, Suction available and Patient being monitored Patient Re-evaluated:Patient Re-evaluated prior to induction Oxygen Delivery Method: Circle system utilized Preoxygenation: Pre-oxygenation with 100% oxygen Induction Type: IV induction Ventilation: Mask ventilation without difficulty Laryngoscope Size: Mac and 3 Grade View: Grade III Tube type: Oral Laser Tube: Cuffed inflated with minimal occlusive pressure - saline Tube size: 7.0 mm Number of attempts: 1 Airway Equipment and Method: Oral airway and Bougie stylet Placement Confirmation: ETT inserted through vocal cords under direct vision, positive ETCO2 and breath sounds checked- equal and bilateral Secured at: 22 cm Tube secured with: Tape Dental Injury: Teeth and Oropharynx as per pre-operative assessment  Difficulty Due To: Difficulty was anticipated

## 2022-04-06 NOTE — Anesthesia Postprocedure Evaluation (Signed)
Anesthesia Post Note  Patient: Erica Navarro  Procedure(s) Performed: RIGHT INFERIOR PARATHYROIDECTOMY (Right)     Patient location during evaluation: PACU Anesthesia Type: General Level of consciousness: awake and alert Pain management: pain level controlled Vital Signs Assessment: post-procedure vital signs reviewed and stable Respiratory status: spontaneous breathing, nonlabored ventilation, respiratory function stable and patient connected to nasal cannula oxygen Cardiovascular status: blood pressure returned to baseline and stable Postop Assessment: no apparent nausea or vomiting Anesthetic complications: yes   Encounter Notable Events  Notable Event Outcome Phase Comment  Difficult to intubate - expected  Intraprocedure Filed from anesthesia note documentation.    Last Vitals:  Vitals:   04/06/22 0845 04/06/22 0900  BP: 132/67 130/69  Pulse: 73 75  Resp: 16 18  Temp:  36.6 C  SpO2: 96% 95%    Last Pain:  Vitals:   04/06/22 0900  TempSrc:   PainSc: 0-No pain                 Barnet Glasgow

## 2022-04-06 NOTE — Op Note (Signed)
OPERATIVE REPORT - PARATHYROIDECTOMY  Preoperative diagnosis: Primary hyperparathyroidism  Postop diagnosis: Same  Procedure: Right inferior minimally invasive parathyroidectomy  Surgeon:  [name], MD  Anesthesia: General endotracheal  Estimated blood loss: Minimal  Preparation: ChloraPrep  Indications: Patient is referred by Dr. Philemon Kingdom for recurrent or persistent primary hyperparathyroidism. Patient is known to my practice from previous evaluation. In June 2020 she underwent a left inferior minimally invasive parathyroidectomy. Prior to that surgery she had calcium levels as high as 11.3 and an intact PTH level of 82. Postoperatively her calcium level fell to 10.7 and her intact PTH level fell to 63. However, recent testing in February 2023 shows a calcium level of 11.1 and an intact PTH level of 89. Patient is largely asymptomatic. She had had some joint and muscle discomfort while taking a statin. Once the statin was discontinued, her symptoms resolved. Her energy level is good. She had no complications following her previous surgery. Patient did have an ultrasound performed in September 2022. This shows a stable nodule in the right thyroid lobe measuring 3.6 cm in maximum dimension. This nodule had undergone previous fine-needle aspiration biopsy with benign results. Nuclear med parathyroid scan localized a right inferior adenoma.  Patient now comes to surgery for parathyroidectomy.  Procedure: The patient was prepared in the pre-operative holding area. The patient was brought to the operating room and placed in a supine position on the operating room table. Following administration of general anesthesia, the patient was positioned and then prepped and draped in the usual strict aseptic fashion. After ascertaining that an adequate level of anesthesia been achieved, a neck incision was made with a #15 blade. Dissection was carried through subcutaneous tissues and platysma.  Hemostasis was obtained with the electrocautery. Skin flaps were developed circumferentially and a Weitlander retractor was placed for exposure.  Strap muscles were incised in the midline. Strap muscles were reflected laterally exposing the thyroid lobe. With gentle blunt dissection the thyroid lobe was mobilized.  Dissection was carried inferiorly and posteriorly and an enlarged parathyroid gland was identified. It was gently mobilized. Vascular structures were divided between small ligaclips. Care was taken to avoid the recurrent laryngeal nerve. The parathyroid gland was completely excised. It was submitted to pathology where frozen section confirmed hypercellular parathyroid tissue consistent with adenoma.  Neck was irrigated with warm saline and good hemostasis was noted. Fibrillar was placed in the operative field. Strap muscles were approximated in the midline with interrupted 3-0 Vicryl sutures. Platysma was closed with interrupted 3-0 Vicryl sutures. Marcaine was infiltrated circumferentially. Skin was closed with a running 4-0 Monocryl subcuticular suture. Wound was washed and dried and Dermabond was applied. Patient was awakened from anesthesia and brought to the recovery room. The patient tolerated the procedure well.   [name], MD [facility] Office: [phone]

## 2022-04-07 ENCOUNTER — Encounter (HOSPITAL_COMMUNITY): Payer: Self-pay | Admitting: Surgery

## 2022-04-07 LAB — SURGICAL PATHOLOGY

## 2022-04-10 NOTE — Telephone Encounter (Signed)
Last Prolia inj 03/09/22 Next Prolia inj 09/10/22

## 2022-05-01 NOTE — Progress Notes (Signed)
Subjective:   Erica Navarro is a 71 y.o. female who presents for Medicare Annual (Subsequent) preventive examination.  Review of Systems     Cardiac Risk Factors include: advanced age (>67mn, >>31women);obesity (BMI >30kg/m2);hypertension;dyslipidemia     Objective:    Today's Vitals   05/02/22 1304 05/02/22 1306  BP: 134/72   Pulse: 85   Resp: 16   Temp: 98.1 F (36.7 C)   SpO2: 98%   Weight: 169 lb 9.6 oz (76.9 kg)   Height: '5\' 3"'$  (1.6 m)   PainSc:  7    Body mass index is 30.04 kg/m.     05/02/2022    1:02 PM 03/29/2022    1:17 PM 04/20/2021   12:58 PM 03/28/2019    8:18 AM 03/21/2019    8:55 AM 01/07/2019   10:59 AM 04/30/2017    8:18 AM  Advanced Directives  Does Patient Have a Medical Advance Directive? No No No No No No No  Would patient like information on creating a medical advance directive? No - Patient declined  Yes (MAU/Ambulatory/Procedural Areas - Information given) No - Patient declined Yes (MAU/Ambulatory/Procedural Areas - Information given) No - Patient declined No - Patient declined    Current Medications (verified) Outpatient Encounter Medications as of 05/02/2022  Medication Sig   acetaminophen (TYLENOL) 500 MG tablet Take 1,000 mg by mouth every 6 (six) hours as needed (pain.).   amLODipine (NORVASC) 10 MG tablet TAKE 1 TABLET BY MOUTH EVERY DAY (Patient taking differently: Take 10 mg by mouth every evening.)   aspirin 81 MG EC tablet Take 81 mg by mouth in the morning.   atorvastatin (LIPITOR) 10 MG tablet TAKE 1 TABLET (10 MG TOTAL) BY MOUTH DAILY. CAN START WITH 1/2 TABLET EVERY OTHER DAY IF DESIRED   Cholecalciferol (VITAMIN D3) 50 MCG (2000 UT) TABS Take 2,000 Units by mouth in the morning.   Cholecalciferol 1000 UNITS capsule Take 1,000 Units by mouth in the morning.   traMADol (ULTRAM) 50 MG tablet Take 1-2 tablets (50-100 mg total) by mouth every 6 (six) hours as needed for moderate pain.   No facility-administered encounter medications on  file as of 05/02/2022.    Allergies (verified) Latex, Pravachol [pravastatin], and Zestril [lisinopril]   History: Past Medical History:  Diagnosis Date   Arthritis    Hyperlipidemia    Hypertension    Hyperthyroidism    Past Surgical History:  Procedure Laterality Date   COLONOSCOPY  08/10/2019   PARATHYROIDECTOMY Left 03/28/2019   Procedure: LEFT PARATHYROIDECTOMY;  Surgeon: GArmandina Gemma MD;  Location: [facility];  Service: General;  Laterality: Left;   PARATHYROIDECTOMY Right 04/06/2022   Procedure: RIGHT INFERIOR PARATHYROIDECTOMY;  Surgeon: GArmandina Gemma MD;  Location: [facility];  Service: General;  Laterality: Right;   POLYPECTOMY     Family History  Problem Relation Age of Onset   Colon cancer Neg Hx    Colon polyps Neg Hx    Esophageal cancer Neg Hx    Rectal cancer Neg Hx    Stomach cancer Neg Hx    Social History   Socioeconomic History   Marital status: Single    Spouse name: Not on file   Number of children: Not on file   Years of education: Not on file   Highest education level: Not on file  Occupational History   Not on file  Tobacco Use   Smoking status: Never   Smokeless tobacco: Never  Vaping Use   Vaping Use: Never  used  Substance and Sexual Activity   Alcohol use: No   Drug use: No   Sexual activity: Never    Birth control/protection: Abstinence  Other Topics Concern   Not on file  Social History Narrative   Not on file   Social Determinants of Health   Financial Resource Strain: Low Risk  (04/20/2021)   Overall Financial Resource Strain (CARDIA)    Difficulty of Paying Living Expenses: Not hard at all  Food Insecurity: No Food Insecurity (04/20/2021)   Hunger Vital Sign    Worried About Running Out of Food in the Last Year: Never true    [place] in the Last Year: Never true  Transportation Needs: No Transportation Needs (04/20/2021)   PRAPARE - Hydrologist (Medical): No    Lack of Transportation  (Non-Medical): No  Physical Activity: Inactive (04/20/2021)   Exercise Vital Sign    Days of Exercise per Week: 0 days    Minutes of Exercise per Session: 0 min  Stress: No Stress Concern Present (04/20/2021)   [place]    Feeling of Stress : Not at all  Social Connections: Moderately Integrated (04/20/2021)   Social Connection and Isolation Panel [NHANES]    Frequency of Communication with Friends and Family: More than three times a week    Frequency of Social Gatherings with Friends and Family: More than three times a week    Attends Religious Services: More than 4 times per year    Active Member of [employer] or Organizations: No    Attends Music therapist: Never    Marital Status: Married    Tobacco Counseling Counseling given: Not Answered   Clinical Intake:  Pre-visit preparation completed: Yes  Pain : 0-10 Pain Score: 7  Pain Type: Chronic pain Pain Location: Leg Pain Descriptors / Indicators: Aching, Dull, Sore Pain Onset: In the past 7 days Pain Frequency: Intermittent     BMI - recorded: 30.04 Nutritional Status: BMI > 30  Obese Nutritional Risks: None Diabetes: No  How often do you need to have someone help you when you read instructions, pamphlets, or other written materials from your doctor or pharmacy?: 1 - Never  Diabetic?no  Interpreter Needed?: No  Information entered by :: Malene Blaydes   Activities of Daily Living    05/02/2022    1:08 PM 03/29/2022    1:19 PM  In your present state of health, do you have any difficulty performing the following activities:  Hearing? 0   Vision? 0   Difficulty concentrating or making decisions? 0   Walking or climbing stairs? 0   Dressing or bathing? 0   Doing errands, shopping? 0 0  Preparing Food and eating ? N   Using the Toilet? N   In the past six months, have you accidently leaked urine? N   Do you have problems with loss of  bowel control? N   Managing your Medications? N   Managing your Finances? N   Housekeeping or managing your Housekeeping? N     Patient Care Team: Copland, Gay Filler, MD as PCP - General (Family Medicine) Philemon Kingdom, MD as Consulting Physician (Internal Medicine)  Indicate any recent Medical Services you may have received from other than [facility] providers in the past year (date may be approximate).     Assessment:   This is a routine wellness examination for Revonda.  Hearing/Vision screen No results  found.  Dietary issues and exercise activities discussed: Current Exercise Habits: Home exercise routine, Type of exercise: walking, Time (Minutes): 20, Frequency (Times/Week): 7, Weekly Exercise (Minutes/Week): 140, Intensity: Mild, Exercise limited by: None identified   Goals Addressed             This Visit's Progress    Patient Stated   On track    Maintain healthy lifestyle       Depression Screen    05/02/2022    1:03 PM 04/20/2021    1:01 PM 04/30/2017    8:19 AM 11/29/2016   10:15 AM 11/17/2015    8:05 AM 05/17/2015    8:31 AM  PHQ 2/9 Scores  PHQ - 2 Score 1 0 0 0 0 0    Fall Risk    05/02/2022    1:02 PM 04/20/2021    1:00 PM 12/08/2020    1:56 PM 04/30/2017    8:19 AM 11/29/2016   10:15 AM  [place] in the past year? 0 0 0 Yes No  Number falls in past yr: 0 0 0 1   Injury with Fall? 0 0 0 No   Risk for fall due to : No Fall Risks      Follow up Falls evaluation completed Falls prevention discussed  Education provided;Falls prevention discussed     FALL RISK PREVENTION PERTAINING TO THE HOME:  Any stairs in or around the home? No  If so, are there any without handrails?  N/a Home free of loose throw rugs in walkways, pet beds, electrical cords, etc? Yes  Adequate lighting in your home to reduce risk of falls? Yes   ASSISTIVE DEVICES UTILIZED TO PREVENT FALLS:  Life alert? No  Use of a cane, walker or w/c? No  Grab bars in the bathroom? Yes   Shower chair or bench in shower? No  Elevated toilet seat or a handicapped toilet? No   TIMED UP AND GO:  Was the test performed? No .    Cognitive Function:    04/30/2017    8:20 AM  MMSE - Mini Mental State Exam  Orientation to time 5  Orientation to Place 5  Registration 3  Attention/ Calculation 5  Recall 3  Language- name 2 objects 2  Language- repeat 1  Language- follow 3 step command 3  Language- read & follow direction 1  Write a sentence 1  Copy design 1  Total score 30        05/02/2022    1:11 PM  6CIT Screen  What Year? 0 points  What month? 0 points  What time? 0 points  Count back from 20 0 points  Months in reverse 0 points  Repeat phrase 0 points  Total Score 0 points    Immunizations Immunization History  Administered Date(s) Administered   Fluad Quad(high Dose 65+) 12/08/2020, 12/19/2021   Influenza, High Dose Seasonal PF 12/02/2018   PFIZER Comirnaty(Gray Top)Covid-19 Tri-Sucrose Vaccine 04/24/2020, 05/22/2020, 11/11/2020, 11/27/2020   Pfizer Covid-19 Vaccine Bivalent Booster 64yr & up 11/27/2020   Pneumococcal Conjugate-13 05/17/2015   Pneumococcal Polysaccharide-23 10/30/2016   Zoster, Live 10/30/2016    TDAP status: Due, Education has been provided regarding the importance of this vaccine. Advised may receive this vaccine at local pharmacy or Health Dept. Aware to provide a copy of the vaccination record if obtained from local pharmacy or Health Dept. Verbalized acceptance and understanding.  Flu Vaccine status: Up to date  Pneumococcal vaccine status: Up  to date  Covid-19 vaccine status: Declined, Education has been provided regarding the importance of this vaccine but patient still declined. Advised may receive this vaccine at local pharmacy or Health Dept.or vaccine clinic. Aware to provide a copy of the vaccination record if obtained from local pharmacy or Health Dept. Verbalized acceptance and understanding.  Qualifies for  Shingles Vaccine? Yes   Zostavax completed No   Shingrix Completed?: No.    Education has been provided regarding the importance of this vaccine. Patient has been advised to call insurance company to determine out of pocket expense if they have not yet received this vaccine. Advised may also receive vaccine at local pharmacy or Health Dept. Verbalized acceptance and understanding.  Screening Tests Health Maintenance  Topic Date Due   TETANUS/TDAP  Never done   Zoster Vaccines- Shingrix (1 of 2) Never done   COVID-19 Vaccine (6 - Pfizer series) 03/27/2021   INFLUENZA VACCINE  05/23/2022   MAMMOGRAM  07/16/2022   COLONOSCOPY (Pts 45-71yr Insurance coverage will need to be confirmed)  07/20/2029   Pneumonia Vaccine 71 Years old  Completed   DEXA SCAN  Completed   Hepatitis C Screening  Completed   HPV VACCINES  Aged Out    Health Maintenance  Health Maintenance Due  Topic Date Due   TETANUS/TDAP  Never done   Zoster Vaccines- Shingrix (1 of 2) Never done   [place]OVID[place] Vacc[place] 03/27/2021    Colorectal cancer screening: Type of screening: Colonoscopy. Completed 07/21/19. Repeat every 10 years  Mammogram status: Completed 07/16/20. Repeat every year every 2 years  Bone Density status: Completed 07/16/20. Results reflect: Bone density results: OSTEOPOROSIS. Repeat every 2 years.  Lung Cancer Screening: (Low Dose CT Chest recommended if Age 73-80years, 30 pack-year currently smoking OR have quit w/in 15years.) does not qualify.   Lung Cancer Screening Referral: n/a  Additional Screening:  Hepatitis C Screening: does qualify; Completed 11/17/15  Vision Screening: Recommended annual ophthalmology exams for early detection of glaucoma and other disorders of the eye. Is the patient up to date with their annual eye exam?  No  Who is the provider or what is the name of the office in which the patient attends annual eye exams? N/A If pt is not established with a  provider, would they like to be referred to a provider to establish care? No .   Dental Screening: Recommended annual dental exams for proper oral hygiene  Community Resource Referral / Chronic Care Management: CRR required this visit?  No   CCM required this visit?  No      Plan:     I have personally reviewed and noted the following in the patient's chart:   Medical and social history Use of alcohol, tobacco or illicit drugs  Current medications and supplements including opioid prescriptions.  Functional ability and status Nutritional status Physical activity Advanced directives List of other physicians Hospitalizations, surgeries, and ER visits in previous 12 months Vitals Screenings to include cognitive, depression, and falls Referrals and appointments  In addition, I have reviewed and discussed with patient certain preventive protocols, quality metrics, and best practice recommendations. A written personalized care plan for preventive services as well as general preventive health recommendations were provided to patient.     SDuard BradyChism, CMA   05/02/2022   Nurse Notes: none

## 2022-05-02 ENCOUNTER — Ambulatory Visit (INDEPENDENT_AMBULATORY_CARE_PROVIDER_SITE_OTHER): Payer: Medicare (Managed Care)

## 2022-05-02 VITALS — BP 134/72 | HR 85 | Temp 98.1°F | Resp 16 | Ht 63.0 in | Wt 169.6 lb

## 2022-05-02 DIAGNOSIS — Z0000 Encounter for general adult medical examination without abnormal findings: Secondary | ICD-10-CM | POA: Diagnosis not present

## 2022-05-02 NOTE — Patient Instructions (Addendum)
Ms. Utecht , Thank you for taking time to come for your Medicare Wellness Visit. I appreciate your ongoing commitment to your health goals. Please review the following plan we discussed and let me know if I can assist you in the future.   Screening recommendations/referrals: Colonoscopy: 07/21/19 due 07/20/29 Mammogram: 07/16/20 due 07/16/22 Bone Density: 07/16/20 due 07/16/22 Recommended yearly ophthalmology/optometry visit for glaucoma screening and checkup Recommended yearly dental visit for hygiene and checkup  Vaccinations: Influenza vaccine: up to date Pneumococcal vaccine: up to date Tdap vaccine: Due-May obtain vaccine at our office or your local pharmacy.  Shingles vaccine: Due-May obtain vaccine at our office or your local pharmacy.    Covid-19:Declined  Advanced directives: no  Conditions/risks identified: see problem list   Next appointment: Follow up in one year for your annual wellness visit 05/04/23   Preventive Care 65 Years and Older, Female Preventive care refers to lifestyle choices and visits with your health care provider that can promote health` and wellness. What does preventive care include? A yearly physical exam. This is also called an annual well check. Dental exams once or twice a year. Routine eye exams. Ask your health care provider how often you should have your eyes checked. Personal lifestyle choices, including: Daily care of your teeth and gums. Regular physical activity. Eating a healthy diet. Avoiding tobacco and drug use. Limiting alcohol use. Practicing safe sex. Taking low-dose aspirin every day. Taking vitamin and mineral supplements as recommended by your health care provider. What happens during an annual well check? The services and screenings done by your health care provider during your annual well check will depend on your age, overall health, lifestyle risk factors, and family history of disease. Counseling  Your health care provider  may ask you questions about your: Alcohol use. Tobacco use. Drug use. Emotional well-being. Home and relationship well-being. Sexual activity. Eating habits. History of falls. Memory and ability to understand (cognition). Work and work Statistician. Reproductive health. Screening  You may have the following tests or measurements: Height, weight, and BMI. Blood pressure. Lipid and cholesterol levels. These may be checked every 5 years, or more frequently if you are over 12 years old. Skin check. Lung cancer screening. You may have this screening every year starting at age 91 if you have a 30-pack-year history of smoking and currently smoke or have quit within the past 15 years. Fecal occult blood test (FOBT) of the stool. You may have this test every year starting at age 46. Flexible sigmoidoscopy or colonoscopy. You may have a sigmoidoscopy every 5 years or a colonoscopy every 10 years starting at age 6. Hepatitis C blood test. Hepatitis B blood test. Sexually transmitted disease (STD) testing. Diabetes screening. This is done by checking your blood sugar (glucose) after you have not eaten for a while (fasting). You may have this done every 1-3 years. Bone density scan. This is done to screen for osteoporosis. You may have this done starting at age 102. Mammogram. This may be done every 1-2 years. Talk to your health care provider about how often you should have regular mammograms. Talk with your health care provider about your test results, treatment options, and if necessary, the need for more tests. Vaccines  Your health care provider may recommend certain vaccines, such as: Influenza vaccine. This is recommended every year. Tetanus, diphtheria, and acellular pertussis (Tdap, Td) vaccine. You may need a Td booster every 10 years. Zoster vaccine. You may need this after age 35. Pneumococcal 13-valent  conjugate (PCV13) vaccine. One dose is recommended after age 3. Pneumococcal  polysaccharide (PPSV23) vaccine. One dose is recommended after age 20. Talk to your health care provider about which screenings and vaccines you need and how often you need them. This information is not intended to replace advice given to you by your health care provider. Make sure you discuss any questions you have with your health care provider. Document Released: 11/05/2015 Document Revised: 06/28/2016 Document Reviewed: 08/10/2015 Elsevier Interactive Patient Education  2017 [place] Prevention in the Home Falls can cause injuries. They can happen to people of all ages. There are many things you can do to make your home safe and to help prevent falls. What can I do on the outside of my home? Regularly fix the edges of walkways and driveways and fix any cracks. Remove anything that might make you trip as you walk through a door, such as a raised step or threshold. Trim any bushes or trees on the path to your home. Use bright outdoor lighting. Clear any walking paths of anything that might make someone trip, such as rocks or tools. Regularly check to see if handrails are loose or broken. Make sure that both sides of any steps have handrails. Any raised decks and porches should have guardrails on the edges. Have any leaves, snow, or ice cleared regularly. Use sand or salt on walking paths during winter. Clean up any spills in your garage right away. This includes oil or grease spills. What can I do in the bathroom? Use night lights. Install grab bars by the toilet and in the tub and shower. Do not use towel bars as grab bars. Use non-skid mats or decals in the tub or shower. If you need to sit down in the shower, use a plastic, non-slip stool. Keep the floor dry. Clean up any water that spills on the floor as soon as it happens. Remove soap buildup in the tub or shower regularly. Attach bath mats securely with double-sided non-slip rug tape. Do not have throw rugs and other  things on the floor that can make you trip. What can I do in the bedroom? Use night lights. Make sure that you have a light by your bed that is easy to reach. Do not use any sheets or blankets that are too big for your bed. They should not hang down onto the floor. Have a firm chair that has side arms. You can use this for support while you get dressed. Do not have throw rugs and other things on the floor that can make you trip. What can I do in the kitchen? Clean up any spills right away. Avoid walking on wet floors. Keep items that you use a lot in easy-to-reach places. If you need to reach something above you, use a strong step stool that has a grab bar. Keep electrical cords out of the way. Do not use floor polish or wax that makes floors slippery. If you must use wax, use non-skid floor wax. Do not have throw rugs and other things on the floor that can make you trip. What can I do with my stairs? Do not leave any items on the stairs. Make sure that there are handrails on both sides of the stairs and use them. Fix handrails that are broken or loose. Make sure that handrails are as long as the stairways. Check any carpeting to make sure that it is firmly attached to the stairs. Fix any carpet that  is loose or worn. Avoid having throw rugs at the top or bottom of the stairs. If you do have throw rugs, attach them to the floor with carpet tape. Make sure that you have a light switch at the top of the stairs and the bottom of the stairs. If you do not have them, ask someone to add them for you. What else can I do to help prevent falls? Wear shoes that: Do not have high heels. Have rubber bottoms. Are comfortable and fit you well. Are closed at the toe. Do not wear sandals. If you use a stepladder: Make sure that it is fully opened. Do not climb a closed stepladder. Make sure that both sides of the stepladder are locked into place. Ask someone to hold it for you, if possible. Clearly  mark and make sure that you can see: Any grab bars or handrails. First and last steps. Where the edge of each step is. Use tools that help you move around (mobility aids) if they are needed. These include: Canes. Walkers. Scooters. Crutches. Turn on the lights when you go into a dark area. Replace any light bulbs as soon as they burn out. Set up your furniture so you have a clear path. Avoid moving your furniture around. If any of your floors are uneven, fix them. If there are any pets around you, be aware of where they are. Review your medicines with your doctor. Some medicines can make you feel dizzy. This can increase your chance of falling. Ask your doctor what other things that you can do to help prevent falls. This information is not intended to replace advice given to you by your health care provider. Make sure you discuss any questions you have with your health care provider. Document Released: 08/05/2009 Document Revised: 03/16/2016 Document Reviewed: 11/13/2014 Elsevier Interactive Patient Education  2017 [employer].

## 2022-05-23 ENCOUNTER — Other Ambulatory Visit: Payer: Self-pay | Admitting: Family Medicine

## 2022-05-23 DIAGNOSIS — I10 Essential (primary) hypertension: Secondary | ICD-10-CM

## 2022-06-24 NOTE — Patient Instructions (Addendum)
It was great to see you again today, please see me in about 6 months assuming all is well I am so sorry for the loss of your son Gwynn Burly.  Please let me know if I can do anything to help you.   The compassionate friends network is a support group specifically for bereaved parents which may be helpful for you  Let's have you try pravastatin 20 mg- ok to take every other day- and see if you can tolerate this more easily Remember flu shot this fall  I will be in touch with your labs Please let me know how your hip does after your injection today

## 2022-06-24 NOTE — Progress Notes (Unsigned)
[place] at [facility] [address], [place], [place] [address] [phone]  Date:  06/28/2022   Name:  Erica Navarro   DOB:  1951-09-13   MRN:  [id number]  PCP:  Darreld Mclean, MD    Chief Complaint: No chief complaint on file.   History of Present Illness:  Erica Navarro is a 71 y.o. very pleasant female patient who presents with the following:  Patient seen today for periodic follow-up Most recent visit with myself was in February- history of hypertension, hyperparathyroidism, thyroid nodule, hyperlipidemia, osteoporosis'  She follows endocrinology for hyperparathyroidism, status post partial parathyroidectomy in 2020 She underwent further testing per per her ENT surgeon Dr. Harlow Asa in June and ended up having a repeat minimally invasive parathyroidectomy  Currently on Prolia for her osteoporosis Most recent visit with her endocrinologist was in February-Dr. Cruzita Lederer  Tetanus Shingrix Influenza vaccine Can update mammogram and DEXA scan Colonoscopy up-to-date  Blood work on chart from June, calcium normal  Amlodipine 10 Aspirin 81 Lipitor Vitamin D Patient Active Problem List   Diagnosis Date Noted   Right thyroid nodule 06/13/2019   Ventral hernia without obstruction or gangrene 10/30/2016   Sedentary lifestyle 10/30/2016   Osteoporosis 06/02/2016   Vitamin D insufficiency 06/02/2016   Hyperparathyroidism, primary ([place]) 06/04/2015   HTN (hypertension) 11/11/2012   High cholesterol 11/11/2012    Past Medical History:  Diagnosis Date   Arthritis    Hyperlipidemia    Hypertension    Hyperthyroidism     Past Surgical History:  Procedure Laterality Date   COLONOSCOPY  08/10/2019   PARATHYROIDECTOMY Left 03/28/2019   Procedure: LEFT PARATHYROIDECTOMY;  Surgeon: [name], MD;  Location: [facility];  Service: General;  Laterality: Left;   PARATHYROIDECTOMY Right 04/06/2022   Procedure: RIGHT INFERIOR  PARATHYROIDECTOMY;  Surgeon: [name], MD;  Location: [facility];  Service: General;  Laterality: Right;   POLYPECTOMY      Social History   Tobacco Use   Smoking status: Never   Smokeless tobacco: Never  Vaping Use   Vaping Use: Never used  Substance Use Topics   Alcohol use: No   Drug use: No    Family History  Problem Relation Age of Onset   Colon cancer Neg Hx    Colon polyps Neg Hx    Esophageal cancer Neg Hx    Rectal cancer Neg Hx    Stomach cancer Neg Hx     Allergies  Allergen Reactions   Latex Itching   Pravachol [Pravastatin]     Body aches    Zestril [Lisinopril] Swelling    hands and face swell    Medication list has been reviewed and updated.  Current Outpatient Medications on File Prior to Visit  Medication Sig Dispense Refill   acetaminophen (TYLENOL) 500 MG tablet Take 1,000 mg by mouth every 6 (six) hours as needed (pain.).     amLODipine (NORVASC) 10 MG tablet TAKE 1 TABLET BY MOUTH EVERY DAY 90 tablet 4   aspirin 81 MG EC tablet Take 81 mg by mouth in the morning.     atorvastatin (LIPITOR) 10 MG tablet TAKE 1 TABLET (10 MG TOTAL) BY MOUTH DAILY. CAN START WITH 1/2 TABLET EVERY OTHER DAY IF DESIRED 90 tablet 1   Cholecalciferol (VITAMIN D3) 50 MCG (2000 UT) TABS Take 2,000 Units by mouth in the morning.     Cholecalciferol 1000 UNITS capsule Take 1,000 Units by mouth in the  morning.     traMADol (ULTRAM) 50 MG tablet Take 1-2 tablets (50-100 mg total) by mouth every 6 (six) hours as needed for moderate pain. 15 tablet 0   No current facility-administered medications on file prior to visit.    Review of Systems:  As per HPI- otherwise negative.\  Physical Examination: There were no vitals filed for this visit. There were no vitals filed for this visit. There is no height or weight on file to calculate BMI. Ideal Body Weight:    GEN: no acute distress. HEENT: Atraumatic, Normocephalic.  Ears and Nose: No external deformity. CV: RRR, No  M/G/R. No JVD. No thrill. No extra heart sounds. PULM: CTA B, no wheezes, crackles, rhonchi. No retractions. No resp. distress. No accessory muscle use. ABD: S, NT, ND, +BS. No rebound. No HSM. EXTR: No c/c/e PSYCH: Normally interactive. Conversant.    Assessment and Plan: ***  Signed Lamar Blinks, MD

## 2022-06-28 ENCOUNTER — Ambulatory Visit: Payer: Medicare (Managed Care) | Admitting: Family Medicine

## 2022-06-28 ENCOUNTER — Encounter: Payer: Self-pay | Admitting: Family Medicine

## 2022-06-28 VITALS — BP 126/70 | HR 61 | Temp 97.8°F | Resp 18 | Ht 63.0 in | Wt 169.2 lb

## 2022-06-28 DIAGNOSIS — F4321 Adjustment disorder with depressed mood: Secondary | ICD-10-CM

## 2022-06-28 DIAGNOSIS — E782 Mixed hyperlipidemia: Secondary | ICD-10-CM

## 2022-06-28 DIAGNOSIS — E210 Primary hyperparathyroidism: Secondary | ICD-10-CM

## 2022-06-28 DIAGNOSIS — M7062 Trochanteric bursitis, left hip: Secondary | ICD-10-CM

## 2022-06-28 DIAGNOSIS — M810 Age-related osteoporosis without current pathological fracture: Secondary | ICD-10-CM

## 2022-06-28 DIAGNOSIS — I10 Essential (primary) hypertension: Secondary | ICD-10-CM | POA: Diagnosis not present

## 2022-06-28 DIAGNOSIS — E7800 Pure hypercholesterolemia, unspecified: Secondary | ICD-10-CM

## 2022-06-28 DIAGNOSIS — Z1231 Encounter for screening mammogram for malignant neoplasm of breast: Secondary | ICD-10-CM

## 2022-06-28 DIAGNOSIS — Z634 Disappearance and death of family member: Secondary | ICD-10-CM

## 2022-06-28 DIAGNOSIS — M818 Other osteoporosis without current pathological fracture: Secondary | ICD-10-CM | POA: Diagnosis not present

## 2022-06-28 LAB — COMPREHENSIVE METABOLIC PANEL
ALT: 11 U/L (ref 0–35)
AST: 16 U/L (ref 0–37)
Albumin: 4.1 g/dL (ref 3.5–5.2)
Alkaline Phosphatase: 65 U/L (ref 39–117)
BUN: 13 mg/dL (ref 6–23)
CO2: 29 mEq/L (ref 19–32)
Calcium: 9.5 mg/dL (ref 8.4–10.5)
Chloride: 102 mEq/L (ref 96–112)
Creatinine, Ser: 0.48 mg/dL (ref 0.40–1.20)
GFR: 94.35 mL/min (ref >60.00)
Glucose, Bld: 83 mg/dL (ref 70–99)
Potassium: 4.5 mEq/L (ref 3.5–5.1)
Sodium: 139 mEq/L (ref 135–145)
Total Bilirubin: 0.6 mg/dL (ref 0.2–1.2)
Total Protein: 7.6 g/dL (ref 6.0–8.3)

## 2022-06-28 LAB — CBC
HCT: 37.5 % (ref 36.0–46.0)
Hemoglobin: 12.2 g/dL (ref 12.0–15.0)
MCHC: 32.6 g/dL (ref 30.0–36.0)
MCV: 86.8 fl (ref 78.0–100.0)
Platelets: 269 10*3/uL (ref 150.0–400.0)
RBC: 4.32 Mil/uL (ref 3.87–5.11)
RDW: 15 % (ref 11.5–15.5)
WBC: 4.3 10*3/uL (ref 4.0–10.5)

## 2022-06-28 LAB — LIPID PANEL
Cholesterol: 237 mg/dL — ABNORMAL HIGH (ref 0–200)
HDL: 66.1 mg/dL (ref >39.00)
LDL Cholesterol: 156 mg/dL — ABNORMAL HIGH (ref 0–99)
NonHDL: 170.68
Total CHOL/HDL Ratio: 4
Triglycerides: 74 mg/dL (ref 0.0–149.0)
VLDL: 14.8 mg/dL (ref 0.0–40.0)

## 2022-06-28 LAB — TSH: TSH: 1.08 u[IU]/mL (ref 0.35–5.50)

## 2022-06-28 MED ORDER — PRAVASTATIN SODIUM 20 MG PO TABS: 20.0000 mg | ORAL_TABLET | Freq: Every day | ORAL | 3 refills | Status: DC

## 2022-06-28 MED ORDER — METHYLPREDNISOLONE ACETATE 40 MG/ML IJ SUSP
40.0000 mg | Freq: Once | INTRAMUSCULAR | Status: AC
  Administered 2022-06-28: 40 mg via INTRA_ARTICULAR

## 2022-07-05 ENCOUNTER — Telehealth: Payer: Self-pay | Admitting: Family Medicine

## 2022-07-05 NOTE — Telephone Encounter (Signed)
Patient called stating [facility] has been trying to reach [place] regarding her pravastatin. She states [facility] wants to know what side effects that medication might have with her other medications. She would like for [place] to get in touch with them and sort it out. Please advise.

## 2022-07-05 NOTE — Telephone Encounter (Signed)
Would Pravastatin interfere with any of her other mediations?

## 2022-07-05 NOTE — Telephone Encounter (Signed)
Called pt- I think their concern is likely that pravastatin was listed as an allergy - however she is not truly allergic.   Called [facility] to update them  [phone]  op2

## 2022-07-17 ENCOUNTER — Encounter (HOSPITAL_BASED_OUTPATIENT_CLINIC_OR_DEPARTMENT_OTHER): Payer: Self-pay

## 2022-07-17 ENCOUNTER — Encounter: Payer: Self-pay | Admitting: Family Medicine

## 2022-07-17 ENCOUNTER — Ambulatory Visit (HOSPITAL_BASED_OUTPATIENT_CLINIC_OR_DEPARTMENT_OTHER)
Admission: RE | Admit: 2022-07-17 | Discharge: 2022-07-17 | Disposition: A | Discharge status: Home / Self Care | Payer: Medicare (Managed Care) | Source: Ambulatory Visit | Attending: Family Medicine | Admitting: Family Medicine

## 2022-07-17 DIAGNOSIS — Z1231 Encounter for screening mammogram for malignant neoplasm of breast: Secondary | ICD-10-CM | POA: Insufficient documentation

## 2022-07-17 DIAGNOSIS — M810 Age-related osteoporosis without current pathological fracture: Secondary | ICD-10-CM | POA: Diagnosis present

## 2022-07-29 NOTE — Telephone Encounter (Signed)
Prolia VOB initiated via [email or web address]  Last Prolia inj 03/09/22 Next Prolia inj 09/10/22

## 2022-08-23 NOTE — Telephone Encounter (Signed)
Prior auth renewal required for [employer]  PA PROCESS DETAILS: PA is required. Call [phone] to initiate over the phone, or to request a PA form be faxed to your office. PA may also be initiated via web by accessing [email or web address]. Please clearly indicate on the PA that the request is for the medical benefit.

## 2022-08-24 NOTE — Telephone Encounter (Signed)
Prior auth initiated via [employer] Prior Auth (EOC) ID:  [id number]

## 2022-09-01 NOTE — Telephone Encounter (Signed)
Prior Authorization initiated for PROLIA via [email or web address] KEY: [id number]  Additional Information Required Drug is covered by current benefit plan. No further PA activity needed

## 2022-09-15 NOTE — Telephone Encounter (Signed)
Pt ready for scheduling on or after 09/10/22  Out-of-pocket cost due at time of visit: $301  Primary: [employer] Medicare Adv PPO Prolia co-insurance: 20% (approximately $276) Admin fee co-insurance: 20% (approximately $25)  Secondary: n/a Prolia co-insurance:  Admin fee co-insurance:   Deductible: does not apply  Prior Auth: not required per CoverMyMeds: Drug is covered by current benefit plan. No further PA activity needed PA# Valid:   ** This summary of benefits is an estimation of the patient's out-of-pocket cost. Exact cost may vary based on individual plan coverage.

## 2022-09-20 NOTE — Telephone Encounter (Signed)
Patient is scheduled for 09/22/2022.

## 2022-09-22 ENCOUNTER — Ambulatory Visit: Payer: Medicare (Managed Care)

## 2022-09-22 VITALS — BP 130/82 | HR 79

## 2022-09-22 DIAGNOSIS — M818 Other osteoporosis without current pathological fracture: Secondary | ICD-10-CM

## 2022-09-22 MED ORDER — DENOSUMAB 60 MG/ML ~~LOC~~ SOSY
60.0000 mg | PREFILLED_SYRINGE | Freq: Once | SUBCUTANEOUS | Status: AC
  Administered 2022-09-22: 60 mg via SUBCUTANEOUS

## 2022-09-22 NOTE — Progress Notes (Signed)
Patient verbally confirmed name, date of birth, and correct medication to be administered. Prolia injection administered and pt tolerated well.  

## 2022-10-24 NOTE — Progress Notes (Addendum)
[place] at [facility] [address] [place], [place] [address] [phone]  Date:  10/26/2022   Name:  Erica Navarro   DOB:  1951-05-01   MRN:  [id number]  PCP:  Darreld Mclean, MD    Chief Complaint: No chief complaint on file.   History of Present Illness:  Erica Navarro is a 72 y.o. very pleasant female patient who presents with the following:  Pt seen today with concern of pain in her back and down legs Most recent visit with myself was in September - history of hypertension, hyperparathyroidism, thyroid nodule, hyperlipidemia, osteoporosis'   She follows endocrinology for hyperparathyroidism, status post partial parathyroidectomy in 2020 She underwent further testing per per her ENT surgeon Dr. Harlow Asa in June 2023 and ended up having a repeat minimally invasive parathyroidectomy; she thinks she has one gland left but we are not quite sure  Her 71 yo son Thurmond died in Mar 09, 2023 of this year- he became a quadraplegic after an accident about 20 years ago and it sounds as though he passed away due to complications from pressure ulcers. She is handling this as well as can be expected.    She has noted left shoulder pain for a couple of months - the ROM is reduced now and she has a harder time using it She notes pain at her left base of thumb Also, her knees are bothering her and she has aches in her legs, shoulders, and back- she has noted this for a month or so She uses tylenol and it does help No fever, etc  She is using some tumeric  Never had any joint surgery We did films of her right knee back in 2016  Pt reports she checks her BP at home and generally gets about 115/80     Patient Active Problem List   Diagnosis Date Noted   Right thyroid nodule 06/13/2019   Ventral hernia without obstruction or gangrene 10/30/2016   Sedentary lifestyle 10/30/2016   Osteoporosis 06/02/2016   Vitamin D insufficiency 06/02/2016    Hyperparathyroidism, primary ([place]) 06/04/2015   HTN (hypertension) 11/11/2012   High cholesterol 11/11/2012    Past Medical History:  Diagnosis Date   Arthritis    Hyperlipidemia    Hypertension    Hyperthyroidism     Past Surgical History:  Procedure Laterality Date   COLONOSCOPY  08/10/2019   PARATHYROIDECTOMY Left 03/28/2019   Procedure: LEFT PARATHYROIDECTOMY;  Surgeon: [name], MD;  Location: [facility];  Service: General;  Laterality: Left;   PARATHYROIDECTOMY Right 04/06/2022   Procedure: RIGHT INFERIOR PARATHYROIDECTOMY;  Surgeon: [name], MD;  Location: [facility];  Service: General;  Laterality: Right;   POLYPECTOMY      Social History   Tobacco Use   Smoking status: Never   Smokeless tobacco: Never  Vaping Use   Vaping Use: Never used  Substance Use Topics   Alcohol use: No   Drug use: No    Family History  Problem Relation Age of Onset   Colon cancer Neg Hx    Colon polyps Neg Hx    Esophageal cancer Neg Hx    Rectal cancer Neg Hx    Stomach cancer Neg Hx     Allergies  Allergen Reactions   Latex Itching   Pravachol [Pravastatin]     Body aches    Zestril [Lisinopril] Swelling    hands and face swell    Medication list has  been reviewed and updated.  Current Outpatient Medications on File Prior to Visit  Medication Sig Dispense Refill   acetaminophen (TYLENOL) 500 MG tablet Take 1,000 mg by mouth every 6 (six) hours as needed (pain.).     amLODipine (NORVASC) 10 MG tablet TAKE 1 TABLET BY MOUTH EVERY DAY 90 tablet 4   aspirin 81 MG EC tablet Take 81 mg by mouth in the morning.     Cholecalciferol (VITAMIN D3) 50 MCG (2000 UT) TABS Take 2,000 Units by mouth in the morning.     Cholecalciferol 1000 UNITS capsule Take 1,000 Units by mouth in the morning.     pravastatin (PRAVACHOL) 20 MG tablet Take 1 tablet (20 mg total) by mouth daily. Ok to take every other day if needed 90 each 3   No current facility-administered medications on file prior  to visit.    Review of Systems:  As per HPI- otherwise negative.   Physical Examination: Vitals:   10/26/22 1308 10/26/22 1327  BP: (!) 170/80 (!) 140/75  Pulse: 60    Vitals:   10/26/22 1308  Weight: 172 lb 9.6 oz (78.3 kg)   Body mass index is 30.57 kg/m. Ideal Body Weight:    GEN: no acute distress.  Mildly obese, looks well HEENT: Atraumatic, Normocephalic.  Ears and Nose: No external deformity. CV: RRR, No M/G/R. No JVD. No thrill. No extra heart sounds. PULM: CTA B, no wheezes, crackles, rhonchi. No retractions. No resp. distress. No accessory muscle use. EXTR: No c/c/e PSYCH: Normally interactive. Conversant.  Right shoulder exam is normal.  Left shoulder exam is consistent with rotator cuff tendinitis.  She has almost full range of motion, slight restriction to flexion and abduction.  No weakness is noted There is a small effusion present in the right knee Osteoarthritis present at the first MCP joints bilaterally Assessment and Plan: Pain in other joint - Plan: Sedimentation rate, C-reactive protein, CK (Creatine Kinase), Antinuclear Antib (ANA), Rheumatoid Factor, celecoxib (CELEBREX) 100 MG capsule  Essential hypertension - Plan: Basic metabolic panel, CBC  Primary hyperparathyroidism ([place]) - Plan: Basic metabolic panel  Multiple joint pain - Plan: Ambulatory referral to Orthopedic Surgery, celecoxib (CELEBREX) 100 MG capsule  Patient seen today for follow-up.  Her blood pressure is a bit elevated in clinic, but she notes it is well-controlled at home.  Labs are pending as above, continue current dose of amlodipine Follow-up on her calcium level today She notes multiple joint pains, especially her knees and left shoulder.  Referral made to orthopedics.  I will also obtain blood work to evaluate for any likely autoimmune disorder.  Prescribed Celebrex that she can use as needed for pain  Signed Lamar Blinks, MD Received her labs 1/5-  Message to  pt  Results for orders placed or performed in visit on 93/23/55  Basic metabolic panel  Result Value Ref Range   Sodium 139 135 - 145 mEq/L   Potassium 4.0 3.5 - 5.1 mEq/L   Chloride 102 96 - 112 mEq/L   CO2 28 19 - 32 mEq/L   Glucose, Bld 141 (H) 70 - 99 mg/dL   BUN 16 6 - 23 mg/dL   Creatinine, Ser 0.52 0.40 - 1.20 mg/dL   GFR 92.33 >60.00 mL/min   Calcium 9.4 8.4 - 10.5 mg/dL  CBC  Result Value Ref Range   WBC 7.3 4.0 - 10.5 K/uL   RBC 4.39 3.87 - 5.11 Mil/uL   Platelets 306.0 150.0 - 400.0 K/uL   Hemoglobin  12.6 12.0 - 15.0 g/dL   HCT 38.5 36.0 - 46.0 %   MCV 87.9 78.0 - 100.0 fl   MCHC 32.7 30.0 - 36.0 g/dL   RDW 14.7 11.5 - 15.5 %  Sedimentation rate  Result Value Ref Range   Sed Rate 30 0 - 30 mm/hr  C-reactive protein  Result Value Ref Range   CRP <1.0 0.5 - 20.0 mg/dL  CK (Creatine Kinase)  Result Value Ref Range   Total CK 112 7 - 177 U/L  Rheumatoid Factor  Result Value Ref Range   Rhuematoid fact SerPl-aCnc <14 <14 IU/mL

## 2022-10-26 ENCOUNTER — Ambulatory Visit (INDEPENDENT_AMBULATORY_CARE_PROVIDER_SITE_OTHER): Payer: Medicare HMO | Admitting: Family Medicine

## 2022-10-26 VITALS — BP 140/75 | HR 60 | Wt 172.6 lb

## 2022-10-26 DIAGNOSIS — I10 Essential (primary) hypertension: Secondary | ICD-10-CM | POA: Diagnosis not present

## 2022-10-26 DIAGNOSIS — M2550 Pain in unspecified joint: Secondary | ICD-10-CM | POA: Diagnosis not present

## 2022-10-26 DIAGNOSIS — E210 Primary hyperparathyroidism: Secondary | ICD-10-CM | POA: Diagnosis not present

## 2022-10-26 DIAGNOSIS — M2559 Pain in other specified joint: Secondary | ICD-10-CM | POA: Diagnosis not present

## 2022-10-26 MED ORDER — CELECOXIB 100 MG PO CAPS: 100.0000 mg | ORAL_CAPSULE | Freq: Two times a day (BID) | ORAL | 1 refills | Status: DC | PRN

## 2022-10-26 NOTE — Patient Instructions (Signed)
It was good to see you today- I will be in touch with your labs and we will get you in with ortho to look at your shoulders and knees  In the meantime you might try some voltaren gel and celebrex as needed Ok to continue the tylenol as well

## 2022-10-27 ENCOUNTER — Encounter: Payer: Self-pay | Admitting: Family Medicine

## 2022-10-27 LAB — CBC
HCT: 38.5 % (ref 36.0–46.0)
Hemoglobin: 12.6 g/dL (ref 12.0–15.0)
MCHC: 32.7 g/dL (ref 30.0–36.0)
MCV: 87.9 fl (ref 78.0–100.0)
Platelets: 306 10*3/uL (ref 150.0–400.0)
RBC: 4.39 Mil/uL (ref 3.87–5.11)
RDW: 14.7 % (ref 11.5–15.5)
WBC: 7.3 10*3/uL (ref 4.0–10.5)

## 2022-10-27 LAB — SEDIMENTATION RATE: Sed Rate: 30 mm/hr (ref 0–30)

## 2022-10-27 LAB — RHEUMATOID FACTOR: Rheumatoid fact SerPl-aCnc: <14 IU/mL (ref <14)

## 2022-10-27 LAB — BASIC METABOLIC PANEL
BUN: 16 mg/dL (ref 6–23)
CO2: 28 mEq/L (ref 19–32)
Calcium: 9.4 mg/dL (ref 8.4–10.5)
Chloride: 102 mEq/L (ref 96–112)
Creatinine, Ser: 0.52 mg/dL (ref 0.40–1.20)
GFR: 92.33 mL/min (ref >60.00)
Glucose, Bld: 141 mg/dL — ABNORMAL HIGH (ref 70–99)
Potassium: 4 mEq/L (ref 3.5–5.1)
Sodium: 139 mEq/L (ref 135–145)

## 2022-10-27 LAB — ANA: Anti Nuclear Antibody (ANA): NEGATIVE

## 2022-10-27 LAB — C-REACTIVE PROTEIN: CRP: <1 mg/dL (ref 0.5–20.0)

## 2022-10-27 LAB — CK: Total CK: 112 U/L (ref 7–177)

## 2022-11-01 ENCOUNTER — Ambulatory Visit (INDEPENDENT_AMBULATORY_CARE_PROVIDER_SITE_OTHER): Payer: Medicare HMO

## 2022-11-01 ENCOUNTER — Ambulatory Visit (INDEPENDENT_AMBULATORY_CARE_PROVIDER_SITE_OTHER): Payer: Medicare HMO | Admitting: Orthopaedic Surgery

## 2022-11-01 DIAGNOSIS — M25562 Pain in left knee: Secondary | ICD-10-CM | POA: Diagnosis not present

## 2022-11-01 DIAGNOSIS — G8929 Other chronic pain: Secondary | ICD-10-CM | POA: Diagnosis not present

## 2022-11-01 DIAGNOSIS — M25512 Pain in left shoulder: Secondary | ICD-10-CM

## 2022-11-01 DIAGNOSIS — M25461 Effusion, right knee: Secondary | ICD-10-CM | POA: Diagnosis not present

## 2022-11-01 DIAGNOSIS — M25561 Pain in right knee: Secondary | ICD-10-CM | POA: Diagnosis not present

## 2022-11-01 DIAGNOSIS — M19012 Primary osteoarthritis, left shoulder: Secondary | ICD-10-CM | POA: Diagnosis not present

## 2022-11-01 DIAGNOSIS — M25462 Effusion, left knee: Secondary | ICD-10-CM | POA: Diagnosis not present

## 2022-11-01 MED ORDER — TRIAMCINOLONE ACETONIDE 40 MG/ML IJ SUSP
80.0000 mg | INTRAMUSCULAR | Status: AC | PRN
  Administered 2022-11-01: 80 mg via INTRA_ARTICULAR

## 2022-11-01 MED ORDER — LIDOCAINE HCL 1 % IJ SOLN
4.0000 mL | INTRAMUSCULAR | Status: AC | PRN
  Administered 2022-11-01: 4 mL

## 2022-11-01 NOTE — Progress Notes (Signed)
Chief Complaint: Left shoulder pain, right knee pain     History of Present Illness:    Erica Navarro is a 72 y.o. female presents today with ongoing multiple joint complaints.  Specifically with regard to the left shoulder she states that since November she has had pain with difficulty range of motion.  She does have a history of parathyroid disease which was treated this year.  She states that since that time she has had decrease in motion and pain about the joint.  She has not had any physical therapy or injections.  With regard to both of her knees the right has been hurting more recently.  This has been going on for a while.  States that she feels like there is fluid in the knee.  Has not had any injection.  Ice is helping and makes the knee feel better as do anti-inflammatories    Surgical History:   None  PMH/PSH/Family History/Social History/Meds/Allergies:    Past Medical History:  Diagnosis Date  . Arthritis   . Hyperlipidemia   . Hypertension   . Hyperthyroidism    Past Surgical History:  Procedure Laterality Date  . COLONOSCOPY  08/10/2019  . PARATHYROIDECTOMY Left 03/28/2019   Procedure: LEFT PARATHYROIDECTOMY;  Surgeon: [name], MD;  Location: [facility];  Service: General;  Laterality: Left;  . PARATHYROIDECTOMY Right 04/06/2022   Procedure: RIGHT INFERIOR PARATHYROIDECTOMY;  Surgeon: [name], MD;  Location: [facility];  Service: General;  Laterality: Right;  . POLYPECTOMY     Social History   Socioeconomic History  . Marital status: Single    Spouse name: Not on file  . Number of children: Not on file  . Years of education: Not on file  . Highest education level: Not on file  Occupational History  . Not on file  Tobacco Use  . Smoking status: Never  . Smokeless tobacco: Never  Vaping Use  . Vaping Use: Never used  Substance and Sexual Activity  . Alcohol use: No  . Drug use: No  . Sexual activity: Never    Birth  control/protection: Abstinence  Other Topics Concern  . Not on file  Social History Narrative  . Not on file   Social Determinants of Health   Financial Resource Strain: Low Risk  (04/20/2021)   Overall Financial Resource Strain (CARDIA)   . Difficulty of Paying Living Expenses: Not hard at all  Food Insecurity: No Food Insecurity (04/20/2021)   Hunger Vital Sign   . Worried About Charity fundraiser in the Last Year: Never true   . Ran Out of Food in the Last Year: Never true  Transportation Needs: No Transportation Needs (04/20/2021)   PRAPARE - Transportation   . Lack of Transportation (Medical): No   . Lack of Transportation (Non-Medical): No  Physical Activity: Inactive (04/20/2021)   Exercise Vital Sign   . Days of Exercise per Week: 0 days   . Minutes of Exercise per Session: 0 min  Stress: No Stress Concern Present (04/20/2021)   [place]   . Feeling of Stress : Not at all  Social Connections: Moderately Integrated (04/20/2021)   Social Connection and Isolation Panel [NHANES]   . Frequency of Communication with Friends and Family: More than three times a week   .  Frequency of Social Gatherings with Friends and Family: More than three times a week   . Attends Religious Services: More than 4 times per year   . Active Member of Clubs or Organizations: No   . Attends Archivist Meetings: Never   . Marital Status: Married   Family History  Problem Relation Age of Onset  . Colon cancer Neg Hx   . Colon polyps Neg Hx   . Esophageal cancer Neg Hx   . Rectal cancer Neg Hx   . Stomach cancer Neg Hx    Allergies  Allergen Reactions  . Latex Itching  . Pravachol [Pravastatin]     Body aches   . Zestril [Lisinopril] Swelling    hands and face swell   Current Outpatient Medications  Medication Sig Dispense Refill  . acetaminophen (TYLENOL) 500 MG tablet Take 1,000 mg by mouth every 6 (six) hours as  needed (pain.).    Marland Kitchen amLODipine (NORVASC) 10 MG tablet TAKE 1 TABLET BY MOUTH EVERY DAY 90 tablet 4  . aspirin 81 MG EC tablet Take 81 mg by mouth in the morning.    . celecoxib (CELEBREX) 100 MG capsule Take 1 capsule (100 mg total) by mouth 2 (two) times daily as needed. 60 capsule 1  . Cholecalciferol (VITAMIN D3) 50 MCG (2000 UT) TABS Take 2,000 Units by mouth in the morning.    . Cholecalciferol 1000 UNITS capsule Take 1,000 Units by mouth in the morning.    . pravastatin (PRAVACHOL) 20 MG tablet Take 1 tablet (20 mg total) by mouth daily. Ok to take every other day if needed 90 each 3   No current facility-administered medications for this visit.   No results found.  Review of Systems:   A ROS was performed including pertinent positives and negatives as documented in the HPI.  Physical Exam :   Constitutional: NAD and appears stated age Neurological: Alert and oriented Psych: Appropriate affect and cooperative There were no vitals taken for this visit.   Comprehensive Musculoskeletal Exam:    Left shoulder range of motion is to 165 of active forward elevation with pain compared to 170 on the right.  There is painful external rotation at the side to 50 degrees compared to 60 degrees on the left.  Internal rotation is to L1.  Bilateral patellofemoral type pain.  No joint line tenderness about the medial or lateral joint space.  Range of motion is from 0 to 130 degrees.  Imaging:   Xray (3 views left shoulder, 4 views right knee, 4 views left knee): Bilateral degenerative changes about the patellofemoral joint.  Well-maintained glenohumeral joint  I personally reviewed and interpreted the radiographs.   Assessment:   72 y.o. female with left shoulder pain consistent with frozen shoulder.  I did describe that given the fact that hers does appear to be resolving but I do believe she would benefit well from an injection would likely resolve her symptoms completely.  She does have  some mild patellofemoral degenerative changes which are consistent with her symptoms of patellofemoral type pain.  To that effect I do believe a right knee injection will also help her significantly  Plan :    -Left shoulder as well as right knee ultrasound-guided injection performed verbal consent obtained    Procedure Note  Patient: Erica Navarro             Date of Birth: Oct 15, 1951           MRN:  [id number]             Visit Date: 11/01/2022  Procedures: Visit Diagnoses:  1. Chronic left shoulder pain   2. Chronic pain of left knee     Large Joint Inj: R knee on 11/01/2022 3:56 PM Indications: pain Details: 22 G 1.5 in needle, ultrasound-guided anterior approach  Arthrogram: No  Medications: 4 mL lidocaine 1 %; 80 mg triamcinolone acetonide 40 MG/ML Outcome: tolerated well, no immediate complications Procedure, treatment alternatives, risks and benefits explained, specific risks discussed. Consent was given by the patient. Immediately prior to procedure a time out was called to verify the correct patient, procedure, equipment, support staff and site/side marked as required. Patient was prepped and draped in the usual sterile fashion.    Large Joint Inj: L glenohumeral on 11/01/2022 3:56 PM Indications: pain Details: 22 G 1.5 in needle, ultrasound-guided anterior approach  Arthrogram: No  Medications: 4 mL lidocaine 1 %; 80 mg triamcinolone acetonide 40 MG/ML Outcome: tolerated well, no immediate complications Procedure, treatment alternatives, risks and benefits explained, specific risks discussed. Consent was given by the patient. Immediately prior to procedure a time out was called to verify the correct patient, procedure, equipment, support staff and site/side marked as required. Patient was prepped and draped in the usual sterile fashion.          I personally saw and evaluated the patient, and participated in the management and treatment plan.  Vanetta Mulders,  MD Attending Physician, Orthopedic Surgery  This document was dictated using Dragon voice recognition software. A reasonable attempt at proof reading has been made to minimize errors.

## 2022-11-09 NOTE — Telephone Encounter (Signed)
Last Prolia inj 09/22/22 Next Prolia inj due 03/25/23

## 2022-12-19 ENCOUNTER — Encounter: Payer: Self-pay | Admitting: Family Medicine

## 2022-12-19 NOTE — Progress Notes (Deleted)
[place] at [facility] [address], [place], [place] [address] [id number] [phone]  Date:  12/27/2022   Name:  Erica Navarro   DOB:  Jun 09, 1951   MRN:  [id number]  PCP:  Darreld Mclean, MD    Chief Complaint: No chief complaint on file.   History of Present Illness:  Erica Navarro is a 72 y.o. very pleasant female patient who presents with the following:  Pt seen today for periodic recheck visit  Last seen by myself last month with back pain   Patient Active Problem List   Diagnosis Date Noted   Right thyroid nodule 06/13/2019   Ventral hernia without obstruction or gangrene 10/30/2016   Sedentary lifestyle 10/30/2016   Osteoporosis 06/02/2016   Vitamin D insufficiency 06/02/2016   Hyperparathyroidism, primary ([place]) 06/04/2015   HTN (hypertension) 11/11/2012   High cholesterol 11/11/2012    Past Medical History:  Diagnosis Date   Arthritis    Hyperlipidemia    Hypertension    Hyperthyroidism     Past Surgical History:  Procedure Laterality Date   COLONOSCOPY  08/10/2019   PARATHYROIDECTOMY Left 03/28/2019   Procedure: LEFT PARATHYROIDECTOMY;  Surgeon: [name], MD;  Location: [facility];  Service: General;  Laterality: Left;   PARATHYROIDECTOMY Right 04/06/2022   Procedure: RIGHT INFERIOR PARATHYROIDECTOMY;  Surgeon: [name], MD;  Location: [facility];  Service: General;  Laterality: Right;   POLYPECTOMY      Social History   Tobacco Use   Smoking status: Never   Smokeless tobacco: Never  Vaping Use   Vaping Use: Never used  Substance Use Topics   Alcohol use: No   Drug use: No    Family History  Problem Relation Age of Onset   Colon cancer Neg Hx    Colon polyps Neg Hx    Esophageal cancer Neg Hx    Rectal cancer Neg Hx    Stomach cancer Neg Hx     Allergies  Allergen Reactions   Latex Itching   Pravachol [Pravastatin]     Body aches    Zestril [Lisinopril] Swelling    hands and face swell     Medication list has been reviewed and updated.  Current Outpatient Medications on File Prior to Visit  Medication Sig Dispense Refill   acetaminophen (TYLENOL) 500 MG tablet Take 1,000 mg by mouth every 6 (six) hours as needed (pain.).     amLODipine (NORVASC) 10 MG tablet TAKE 1 TABLET BY MOUTH EVERY DAY 90 tablet 4   aspirin 81 MG EC tablet Take 81 mg by mouth in the morning.     celecoxib (CELEBREX) 100 MG capsule Take 1 capsule (100 mg total) by mouth 2 (two) times daily as needed. 60 capsule 1   Cholecalciferol (VITAMIN D3) 50 MCG (2000 UT) TABS Take 2,000 Units by mouth in the morning.     Cholecalciferol 1000 UNITS capsule Take 1,000 Units by mouth in the morning.     pravastatin (PRAVACHOL) 20 MG tablet Take 1 tablet (20 mg total) by mouth daily. Ok to take every other day if needed 90 each 3   No current facility-administered medications on file prior to visit.    Review of Systems:  ***  Physical Examination: There were no vitals filed for this visit. There were no vitals filed for this visit. There is no height or weight on file to calculate BMI. Ideal Body Weight:    ***  Assessment and  Plan: ***  Signed Lamar Blinks, MD

## 2022-12-22 ENCOUNTER — Ambulatory Visit: Payer: Medicare HMO | Admitting: Internal Medicine

## 2022-12-22 ENCOUNTER — Encounter: Payer: Self-pay | Admitting: Internal Medicine

## 2022-12-22 VITALS — BP 140/90 | HR 80 | Ht 63.0 in | Wt 172.6 lb

## 2022-12-22 DIAGNOSIS — E559 Vitamin D deficiency, unspecified: Secondary | ICD-10-CM | POA: Diagnosis not present

## 2022-12-22 DIAGNOSIS — E041 Nontoxic single thyroid nodule: Secondary | ICD-10-CM | POA: Diagnosis not present

## 2022-12-22 DIAGNOSIS — E210 Primary hyperparathyroidism: Secondary | ICD-10-CM | POA: Diagnosis not present

## 2022-12-22 DIAGNOSIS — M818 Other osteoporosis without current pathological fracture: Secondary | ICD-10-CM | POA: Diagnosis not present

## 2022-12-22 LAB — VITAMIN D 25 HYDROXY (VIT D DEFICIENCY, FRACTURES): VITD: 49.55 ng/mL (ref 30.00–100.00)

## 2022-12-22 NOTE — Progress Notes (Signed)
Patient ID: Erica Navarro, female   DOB: 05/12/1951, 72 y.o.   MRN: [id number]  HPI  Erica Navarro is a 72 y.o.-year-old female, returning for f/u for h/o primary hyperparathyroidism - now s/p parathyroidectomy, vit D insufficiency, and OP. Last visit 1 year ago.  Interim history: No falls or fractures since last visit.   She has muscle/joint aches - had a steroid injection in L knee >> helped. She also has rotator cuff sd. in L shoulder - had a steroid inj. She also had a steroid inj in L hip for bursitis >> helped. Last inj 2 mo ago. No vertigo, dizziness, vision problems. She had right inferior parathyroidectomy by Dr. Harlow Asa since last visit.  Reviewed and addended history: Pt was dx with hypercalcemia in 2016.  Reviewed pertinent labs: Lab Results  Component Value Date   PTH 89 (H) 12/16/2021   PTH Comment 12/16/2021   PTH 91 (H) 06/13/2021   PTH 55 06/11/2020   PTH Comment 06/11/2020   PTH 30 06/13/2019   PTH Comment 06/13/2019   PTH 65 06/11/2018   PTH Comment 06/11/2018   PTH 46 06/01/2017   CALCIUM 9.4 10/26/2022   CALCIUM 9.5 06/28/2022   CALCIUM 10.3 03/29/2022   CALCIUM 10.9 (H) 12/19/2021   CALCIUM 11.1 (H) 12/16/2021   CALCIUM 10.9 (H) 06/13/2021   CALCIUM 11.5 (H) 12/08/2020   CALCIUM 10.6 (H) 06/11/2020   CALCIUM 10.6 (H) 03/08/2020   CALCIUM 10.8 (H) 06/13/2019   Osteoporosis:  We started Fosamax 70 mg weekly 2017.  No hip, thigh, jaw pain.   I advised her to stop Fosamax and start Prolia after the below results returned (06/2020).  She stopped Fosamax 05-03/2021 and was not able to start Prolia 2/2 insurance - finally started 09/07/2021.  No side effects. Prolia doses:  09/07/2021 03/09/2022 09/22/2022  Reviewed DXA scan reports: 07/17/2022 [facility])  Lumbar spine (L1-L3) Femoral neck (FN)  T-score - 2.2 RFN: - 1.5 LFN: - 1.9   07/16/2020 ([facility])  Lumbar spine (L1-L4) Femoral neck (FN)  T-score - 3.1  RFN: - 1.9 LFN: - 2.6   06/17/2018 ([facility])  Lumbar  spine (L1-L4) Femoral neck (FN)  T-score - 3.1 RFN: - 1.9 LFN: - 2.0   12/03/2015 ([facility])  Lumbar spine (L1-L4) Femoral neck (FN)  T-score - 3.6 RFN: - 2.1 LFN: - 2.4   No history of kidney stones.  No CKD. Last BUN/Cr: Lab Results  Component Value Date   BUN 16 10/26/2022   CREATININE 0.52 10/26/2022   She is not on HCTZ.  Vitamin D insufficiency, now resolved: Lab Results  Component Value Date   VD25OH 50.57 12/16/2021   VD25OH 54.04 06/13/2021   VD25OH 62.75 03/08/2020   VD25OH 58.85 06/13/2019   VD25OH 50.51 05/30/2018   VD25OH 48.93 10/31/2017   VD25OH 47.02 06/01/2017   VD25OH 42.29 10/30/2016   VD25OH 36.55 06/02/2016   VD25OH 23.45 (L) 06/04/2015   She continues on 3000 units vitamin D daily.  Pt does not have a FH of hypercalcemia, pituitary tumors, thyroid cancer, or osteoporosis.   Reviewed her initial labs: Component     Latest Ref Rng 12/04/2014  BUN     6 - 23 mg/dL 16  Creatinine     0.40 - 1.20 mg/dL 0.63  Calcium     8.4 - 10.5 mg/dL 10.9 (H)  GFR     >60.00 mL/min 121.86  Vitamin D 1, 25 (OH) Total     18 - 72 pg/mL 96 (H)  Vitamin D3 1, 25 (OH)      96  Vitamin D2 1, 25 (OH)      <8  PTH     15 - 65 pg/mL 82 (H)  Calcium Ionized     1.12 - 1.32 mmol/L 1.50 (H)  VITD     30.00 - 100.00 ng/mL 37.83  Phosphorus     2.3 - 4.6 mg/dL 2.7  Magnesium     1.5 - 2.5 mg/dL 1.9   Component     Latest Ref Rng 01/11/2015 01/11/2015         3:03 PM  3:07 PM  Calcium, Ur      4   Calcium, 24 hour urine     100 - 250 mg/day 112   Creatinine, Urine      50.4 50.5  Creatinine, 24H Ur     700 - 1800 mg/day 1412 1415   Results pointed towards primary hyperparathyroidism. Her 24-hour urine calcium was not elevated. FeCa= 0.0045 (0.45%).  This was lower than expected from primary hyperparathyroidism, however, there was low suspicion for familial hypocalciuric hypercalcemia due to previously normal calcium levels and also normal magnesium  levels.  She initially refused a referral to surgery, but afterwards agreed with a referral to Dr. Harlow Asa.  Reviewed the results of her previous tests: [facility] sestamibi scan (09/04/2018): Not localizing 4D CT (12/09/2018): Posterior left thyroid lobe mass measuring 6.9 mm  She had parathyroidectomy on 03/28/2019: Enlarged left posterior parathyroid gland: 721 mg, 2.1 cm in diameter  PTH and calcium remained elevated so I referred her back to surgery. She had right inferior parathyroidectomy 04/06/2022: 1.262 g parathyroid adenoma measuring 1.5 x 1.2 x 0.8 cm.  Thyroid nodules: Thyroid ultrasound (09/21/2018): 1 thyroid nodule 3.0 x 2.2 x 2.0, solid, isoechoic FNA of this nodule (10/01/2018): Benign Thyroid U/S (06/28/2021): Parenchymal Echotexture: Mildly heterogenous Isthmus: 0.3 cm Right lobe: 4.5 x 2.3 x 2.7 cm Left lobe: 3.3 x 1.5 x 1.4 cm  _________________________________________________________   The previously biopsied nodule in the right mid gland measures 3.6 x 2.2 x 2.0 cm compared to 3.4 x 2.2 x 2.0 cm previously. No significant interval change or new features identified. Incidental note is made of a tiny subcentimeter hypoechoic nodule more medially in the right mid gland. This finding does not meet criteria to warrant further evaluation.   No new nodules or suspicious features.   IMPRESSION: No significant interval change in the size or appearance of the previously biopsied nodule in the right mid gland. Recommend correlation with prior biopsy results.   No new nodules or suspicious features.  Pt denies: - feeling nodules in neck - hoarseness - dysphagia - choking  She also has a history of HTN, HL.  ROS: + see HPI  I reviewed pt's medications, allergies, PMH, social hx, family hx, and changes were documented in the history of present illness. Otherwise, unchanged from my initial visit note.  Past Medical History:  Diagnosis Date   Arthritis     Hyperlipidemia    Hypertension    Hyperthyroidism    Past Surgical History:  Procedure Laterality Date   COLONOSCOPY  08/10/2019   PARATHYROIDECTOMY Left 03/28/2019   Procedure: LEFT PARATHYROIDECTOMY;  Surgeon: [name], MD;  Location: [facility];  Service: General;  Laterality: Left;   PARATHYROIDECTOMY Right 04/06/2022   Procedure: RIGHT INFERIOR PARATHYROIDECTOMY;  Surgeon: [name], MD;  Location: [facility];  Service: General;  Laterality: Right;   POLYPECTOMY     History  Social History   Marital Status: Single    Spouse Name: N/A   Number of Children: 4   Occupational History   janitor   Social History Main Topics   Smoking status: Never Smoker    Smokeless tobacco: Not on file   Alcohol Use: No   Drug Use: No   Current Outpatient Medications on File Prior to Visit  Medication Sig Dispense Refill   acetaminophen (TYLENOL) 500 MG tablet Take 1,000 mg by mouth every 6 (six) hours as needed (pain.).     amLODipine (NORVASC) 10 MG tablet TAKE 1 TABLET BY MOUTH EVERY DAY 90 tablet 4   aspirin 81 MG EC tablet Take 81 mg by mouth in the morning.     celecoxib (CELEBREX) 100 MG capsule Take 1 capsule (100 mg total) by mouth 2 (two) times daily as needed. 60 capsule 1   Cholecalciferol (VITAMIN D3) 50 MCG (2000 UT) TABS Take 2,000 Units by mouth in the morning.     Cholecalciferol 1000 UNITS capsule Take 1,000 Units by mouth in the morning.     pravastatin (PRAVACHOL) 20 MG tablet Take 1 tablet (20 mg total) by mouth daily. Ok to take every other day if needed 90 each 3   No current facility-administered medications on file prior to visit.   Allergies  Allergen Reactions   Latex Itching   Pravachol [Pravastatin]     Body aches    Zestril [Lisinopril] Swelling    hands and face swell   FH: - see HPI + - HTN and heart ds in mother - HL in sister  PE: BP (!) 140/90 (BP Location: Right Arm, Patient Position: Sitting, Cuff Size: Normal)   Pulse 80   Ht '5\' 3"'$  (1.6 m)    Wt 172 lb 9.6 oz (78.3 kg)   SpO2 99%   BMI 30.57 kg/m    Wt Readings from Last 3 Encounters:  12/22/22 172 lb 9.6 oz (78.3 kg)  10/26/22 172 lb 9.6 oz (78.3 kg)  06/28/22 169 lb 3.2 oz (76.7 kg)   Constitutional: overweight, in NAD Eyes:EOMI, no exophthalmos ENT:  + L thyroid fullness, cervical scar healed, no cervical lymphadenopathy Cardiovascular: RRR, No MRG Respiratory: CTA B Musculoskeletal: no deformities Skin: no rashes Neurological: no tremor with outstretched hands  Assessment: 1. Primary hyperparathyroidism  2. Vitamin D insufficiency  3. OP  4. R Thyroid nodule  5. HTN  Plan: Patient with history of primary hyperparathyroidism, with elevated calcium levels as high as 11.5.  An intact PTH level was also high, at 104, for a calcium of 10.7.   -At the admission sestamibi scan was nonlocalizing, but a 4D CT showed a posterior left lobe mass consistent with a parathyroid adenoma.  She had parathyroidectomy in 03/2019 by Dr. Harlow Asa.  The posterior left lobe mass turned out to be a hyperplastic parathyroid gland.  Calcium and PTH levels decreased after surgery but not to normal.  At last visit, they were both elevated. -At last visit, I referred her back to surgery and she had right inferior parathyroidectomy on 04/06/2022 by Dr. Harlow Asa.  This showed a 1.262 g mass consistent with a parathyroid adenoma.  This measured 1.5 cm in the largest dimension. -Most recent calcium level was normal, at 9.4 on 10/26/2022 -will not repeat this today -I will see the patient back in 1 year  2. Vitamin D insufficiency -on 3000 units vitamin D daily -Vitamin D level was normal at last visit -We will repeat this  now  3. OP -No fractures or falls since last visit -We started Fosamax 70 mg weekly in 2017.  She completed 4 years of treatment.  After this, I suggested Prolia.  She did stop Fosamax but she was not able to start Prolia until 09/07/2021, when she had her first dose.  She had  2 doses since last visit: 03/09/2022 and 09/22/2022.  She tolerates Prolia well, without hip/thigh/jaw pain -She had lower T-scores at the level of the left femoral neck before starting Prolia but she had another bone density scan on 07/17/2022 which shows significant improvement in her T-scores -Continue Prolia for now -She is aware about possible side effects of Prolia, similar to bisphosphonates: ONJ and atypical fractures -At today's visit I also advised her to limit steroid injections if possible due to the negative effect on the bone density and fracture risk -Plan to get another bone density in 2 years from the previous  4. Thyroid nodule -Patient with a history of a right mid thyroid nodule, isoechoic, solid, 3 cm in the largest dimension.  This was previously biopsied with benign results -No neck compression symptoms -Before last visit we checked another ultrasound and this showed that the nodule was stable -Most recent TSH was normal: Lab Results  Component Value Date   TSH 1.08 06/28/2022  -No intervention is needed for now, but we will continue to keep an eye on the nodule  Component     Latest Ref Rng 12/22/2022  VITD     30.00 - 100.00 ng/mL 49.55   Normal Vitamin D.  Philemon Kingdom, MD PhD [facility] Endocrinology

## 2022-12-22 NOTE — Patient Instructions (Signed)
Please stop at the lab.  You should have an endocrinology follow-up appointment in 1 year.

## 2022-12-27 ENCOUNTER — Ambulatory Visit: Payer: Medicare (Managed Care) | Admitting: Family Medicine

## 2023-02-24 NOTE — Telephone Encounter (Signed)
Prolia VOB initiated via [email or web address]  Last Prolia inj 09/22/22 Next Prolia inj due 03/25/23

## 2023-03-13 NOTE — Telephone Encounter (Signed)
Prior auth required for PROLIA  [employer]  

## 2023-03-14 NOTE — Telephone Encounter (Signed)
Prior Authorization initiated for PROLIA via [email or web address] KEY: [id number]

## 2023-03-20 NOTE — Telephone Encounter (Signed)
Prior Auth APPROVED PA# [id number] Valid: 03/14/23-10/23/23

## 2023-03-20 NOTE — Telephone Encounter (Signed)
Pt ready for scheduling on or after 03/25/23  Out-of-pocket cost due at time of visit: $327  Primary: [employer] Medicare Adv Gold Plus Prolia co-insurance: 20% (approximately $302) Admin fee co-insurance: 20% (approximately $25)  Deductible: does not apply  Prior Auth APPROVED PA# [id number] Valid: 03/14/23-10/23/23  Secondary: N/A Prolia co-insurance:  Admin fee co-insurance:  Deductible:  Prior Auth:  PA# Valid:   ** This summary of benefits is an estimation of the patient's out-of-pocket cost. Exact cost may vary based on individual plan coverage.

## 2023-03-27 ENCOUNTER — Ambulatory Visit: Payer: Medicare HMO

## 2023-03-27 VITALS — BP 138/70 | HR 84 | Ht 63.0 in | Wt 175.8 lb

## 2023-03-27 DIAGNOSIS — M818 Other osteoporosis without current pathological fracture: Secondary | ICD-10-CM | POA: Diagnosis not present

## 2023-03-27 MED ORDER — DENOSUMAB 60 MG/ML ~~LOC~~ SOSY
60.0000 mg | PREFILLED_SYRINGE | Freq: Once | SUBCUTANEOUS | Status: AC
  Administered 2023-03-27: 60 mg via SUBCUTANEOUS

## 2023-03-27 NOTE — Progress Notes (Signed)
After obtaining consent, and per orders of Dr. Gherghe, injection of Prolia 60mg/mL given by Mehr Depaoli S Rexton Greulich in the Left Arm SQ. Patient instructed to remain in clinic for 20 minutes afterwards, and to report any adverse reaction to me immediately.  

## 2023-03-29 NOTE — Telephone Encounter (Signed)
Last Prolia inj 03/27/23 Next Prolia inj due 09/27/23

## 2023-05-08 ENCOUNTER — Ambulatory Visit: Payer: Medicare HMO | Admitting: Emergency Medicine

## 2023-05-08 VITALS — Ht 63.0 in | Wt 175.0 lb

## 2023-05-08 DIAGNOSIS — Z0000 Encounter for general adult medical examination without abnormal findings: Secondary | ICD-10-CM

## 2023-05-08 NOTE — Progress Notes (Signed)
Subjective:   Per patient no change in vitals since last visit, unable to obtain new vitals due to telehealth visit   Erica Navarro is a 72 y.o. female who presents for Medicare Annual (Subsequent) preventive examination.  Visit Complete: Virtual  I connected with  Erica Navarro on 05/08/23 by a audio enabled telemedicine application and verified that I am speaking with the correct person using two identifiers.  Patient Location: Other:  sister's house  Provider Location: Home Office  I discussed the limitations of evaluation and management by telemedicine. The patient expressed understanding and agreed to proceed.  Patient Medicare AWV questionnaire was completed by the patient on 04/27/23; I have confirmed that all information answered by patient is correct and no changes since this date.  Review of Systems     Cardiac Risk Factors include: advanced age (>40men, >46 women);hypertension;dyslipidemia;obesity (BMI >30kg/m2)     Objective:    Today's Vitals   05/08/23 1457  Weight: 175 lb (79.4 kg)  Height: 5\' 3"  (1.6 m)   Body mass index is 31 kg/m.     05/08/2023    3:10 PM 05/02/2022    1:02 PM 03/29/2022    1:17 PM 04/20/2021   12:58 PM 03/28/2019    8:18 AM 03/21/2019    8:55 AM 01/07/2019   10:59 AM  Advanced Directives  Does Patient Have a Medical Advance Directive? No No No No No No No  Would patient like information on creating a medical advance directive? No - Patient declined No - Patient declined  Yes (MAU/Ambulatory/Procedural Areas - Information given) No - Patient declined Yes (MAU/Ambulatory/Procedural Areas - Information given) No - Patient declined    Current Medications (verified) Outpatient Encounter Medications as of 05/08/2023  Medication Sig   acetaminophen (TYLENOL) 500 MG tablet Take 1,000 mg by mouth every 6 (six) hours as needed (pain.).   amLODipine (NORVASC) 10 MG tablet TAKE 1 TABLET BY MOUTH EVERY DAY   aspirin 81 MG EC tablet Take 81 mg by mouth  in the morning.   Cholecalciferol (VITAMIN D3) 50 MCG (2000 UT) TABS Take 2,000 Units by mouth in the morning.   Cholecalciferol 1000 UNITS capsule Take 1,000 Units by mouth in the morning.   pravastatin (PRAVACHOL) 20 MG tablet Take 1 tablet (20 mg total) by mouth daily. Ok to take every other day if needed   celecoxib (CELEBREX) 100 MG capsule Take 1 capsule (100 mg total) by mouth 2 (two) times daily as needed. (Patient not taking: Reported on 05/08/2023)   No facility-administered encounter medications on file as of 05/08/2023.    Allergies (verified) Latex, Pravachol [pravastatin], and Zestril [lisinopril]   History: Past Medical History:  Diagnosis Date   Arthritis    Hyperlipidemia    Hypertension    Hyperthyroidism    Past Surgical History:  Procedure Laterality Date   COLONOSCOPY  08/10/2019   PARATHYROIDECTOMY Left 03/28/2019   Procedure: LEFT PARATHYROIDECTOMY;  Surgeon: Darnell Level, MD;  Location: [facility];  Service: General;  Laterality: Left;   PARATHYROIDECTOMY Right 04/06/2022   Procedure: RIGHT INFERIOR PARATHYROIDECTOMY;  Surgeon: Darnell Level, MD;  Location: [facility];  Service: General;  Laterality: Right;   POLYPECTOMY     Family History  Problem Relation Age of Onset   Heart attack Mother    Hypertension Sister    Hypertension Sister    Heart attack Brother    Heart attack Brother    Colon cancer Neg Hx    Colon polyps Neg  Hx    Esophageal cancer Neg Hx    Rectal cancer Neg Hx    Stomach cancer Neg Hx    Social History   Socioeconomic History   Marital status: Widowed    Spouse name: Not on file   Number of children: Not on file   Years of education: Not on file   Highest education level: Not on file  Occupational History   Occupation: part time    Comment: janitorial  Tobacco Use   Smoking status: Never   Smokeless tobacco: Never  Vaping Use   Vaping status: Never Used  Substance and Sexual Activity   Alcohol use: No   Drug use: No   Sexual  activity: Never    Birth control/protection: Abstinence  Other Topics Concern   Not on file  Social History Narrative   Not on file   Social Determinants of Health   Financial Resource Strain: Low Risk  (04/27/2023)   Overall Financial Resource Strain (CARDIA)    Difficulty of Paying Living Expenses: Not very hard  Food Insecurity: No Food Insecurity (04/27/2023)   Hunger Vital Sign    Worried About Running Out of Food in the Last Year: Never true    Ran Out of Food in the Last Year: Never true  Transportation Needs: No Transportation Needs (04/27/2023)   PRAPARE - Administrator, Civil Service (Medical): No    Lack of Transportation (Non-Medical): No  Physical Activity: Sufficiently Active (04/27/2023)   Exercise Vital Sign    Days of Exercise per Week: 4 days    Minutes of Exercise per Session: 40 min  Stress: No Stress Concern Present (04/27/2023)   [employer] of Occupational Health - Occupational Stress Questionnaire    Feeling of Stress : Not at all  Social Connections: Moderately Integrated (04/27/2023)   Social Connection and Isolation Panel [NHANES]    Frequency of Communication with Friends and Family: More than three times a week    Frequency of Social Gatherings with Friends and Family: More than three times a week    Attends Religious Services: More than 4 times per year    Active Member of [employer] or Organizations: Yes    Attends Banker Meetings: Patient declined    Marital Status: Widowed    Tobacco Counseling Counseling given: Not Answered   Clinical Intake:  Pre-visit preparation completed: Yes  Pain : No/denies pain     BMI - recorded: 31 Nutritional Status: BMI > 30  Obese Nutritional Risks: None Diabetes: No  How often do you need to have someone help you when you read instructions, pamphlets, or other written materials from your doctor or pharmacy?: 1 - Never  Interpreter Needed?: No  Information entered by :: Tora Kindred, CMA   Activities of Daily Living    04/27/2023    9:05 AM  In your present state of health, do you have any difficulty performing the following activities:  Hearing? 0  Vision? 0  Difficulty concentrating or making decisions? 0  Walking or climbing stairs? 0  Dressing or bathing? 0  Doing errands, shopping? 0  Preparing Food and eating ? N  Using the Toilet? N  In the past six months, have you accidently leaked urine? N  Do you have problems with loss of bowel control? N  Managing your Medications? N  Managing your Finances? N  Housekeeping or managing your Housekeeping? N    Patient Care Team: Copland, Gwenlyn Found, MD as  PCP - General (Family Medicine) Carlus Pavlov, MD as Consulting Physician (Internal Medicine)  Indicate any recent Medical Services you may have received from other than [facility] providers in the past year (date may be approximate).     Assessment:   This is a routine wellness examination for Erica Navarro.  Hearing/Vision screen Hearing Screening - Comments:: Denies hearing loss  Dietary issues and exercise activities discussed:     Goals Addressed               This Visit's Progress     Exercise 3x per week (30 min per time) (pt-stated)        Be more consistent with exercise      COMPLETED: Patient Stated        Maintain healthy lifestyle      COMPLETED: Weight (lb) < 175 lb (79.4 kg) (pt-stated)   175 lb (79.4 kg)     Begin walking at 3-4x/ week.        Depression Screen    05/08/2023    3:07 PM 06/28/2022    8:14 AM 05/02/2022    1:03 PM 04/20/2021    1:01 PM 04/30/2017    8:19 AM 11/29/2016   10:15 AM 11/17/2015    8:05 AM  PHQ 2/9 Scores  PHQ - 2 Score 0 0 1 0 0 0 0    Fall Risk    04/27/2023    9:05 AM 06/28/2022    8:14 AM 05/02/2022    1:02 PM 04/20/2021    1:00 PM 12/08/2020    1:56 PM  Fall Risk   Falls in the past year? 0 0 0 0 0  Number falls in past yr: 0 0 0 0 0  Injury with Fall? 0 0 0 0 0  Risk for fall due to : No Fall  Risks  No Fall Risks    Follow up Falls prevention discussed  Falls evaluation completed Falls prevention discussed     MEDICARE RISK AT HOME:   TIMED UP AND GO:  Was the test performed?  No    Cognitive Function:    04/30/2017    8:20 AM  MMSE - Mini Mental State Exam  Orientation to time 5  Orientation to Place 5  Registration 3  Attention/ Calculation 5  Recall 3  Language- name 2 objects 2  Language- repeat 1  Language- follow 3 step command 3  Language- read & follow direction 1  Write a sentence 1  Copy design 1  Total score 30        05/08/2023    3:11 PM 05/02/2022    1:11 PM  6CIT Screen  What Year? 0 points 0 points  What month? 0 points 0 points  What time? 0 points 0 points  Count back from 20 0 points 0 points  Months in reverse 0 points 0 points  Repeat phrase 0 points 0 points  Total Score 0 points 0 points    Immunizations Immunization History  Administered Date(s) Administered   Fluad Quad(high Dose 65+) 12/08/2020, 12/19/2021   Influenza, High Dose Seasonal PF 12/02/2018   PFIZER Comirnaty(Gray Top)Covid-19 Tri-Sucrose Vaccine 04/24/2020, 05/22/2020, 11/11/2020, 11/27/2020   Pfizer Covid-19 Vaccine Bivalent Booster 60yrs & up 11/27/2020   Pneumococcal Conjugate-13 05/17/2015   Pneumococcal Polysaccharide-23 10/30/2016   Zoster, Live 10/30/2016    TDAP status: Up to date per patient. Received 2023 at [facility]  Flu Vaccine status: Up to date  Pneumococcal vaccine status: Up to date  Covid-19 vaccine status: Completed vaccines  Qualifies for Shingles Vaccine? Yes   Zostavax completed Yes   Shingrix Completed?: No.    Education has been provided regarding the importance of this vaccine. Patient has been advised to call insurance company to determine out of pocket expense if they have not yet received this vaccine. Advised may also receive vaccine at local pharmacy or Health Dept. Verbalized acceptance and understanding.  Screening  Tests Health Maintenance  Topic Date Due   DTaP/Tdap/Td (1 - Tdap) Never done   Zoster Vaccines- Shingrix (1 of 2) 08/24/1999   COVID-19 Vaccine (6 - 2023-24 season) 06/23/2022   INFLUENZA VACCINE  05/24/2023   MAMMOGRAM  07/18/2023   Medicare Annual Wellness (AWV)  05/07/2024   DEXA SCAN  07/17/2024   Colonoscopy  07/20/2026   Pneumonia Vaccine 60+ Years old  Completed   Hepatitis C Screening  Completed   HPV VACCINES  Aged Out    Health Maintenance  Health Maintenance Due  Topic Date Due   DTaP/Tdap/Td (1 - Tdap) Never done   Zoster Vaccines- Shingrix (1 of 2) 08/24/1999   COVID-19 Vaccine (6 - 2023-24 season) 06/23/2022    Colorectal cancer screening: Type of screening: Colonoscopy. Completed 07/21/2019. Repeat every 7 years  Mammogram status: Completed 07/17/22. Repeat every year  Bone Density status: Completed 07/17/22. Results reflect: Bone density results: OSTEOPENIA. Repeat every 2 years.  Lung Cancer Screening: (Low Dose CT Chest recommended if Age 84-80 years, 20 pack-year currently smoking OR have quit w/in 15years.) does not qualify.   Lung Cancer Screening Referral: n/a  Additional Screening:  Hepatitis C Screening: does qualify; Completed 11/17/15  Vision Screening: Recommended annual ophthalmology exams for early detection of glaucoma and other disorders of the eye. Is the patient up to date with their annual eye exam?  No  Who is the provider or what is the name of the office in which the patient attends annual eye exams? none If pt is not established with a provider, would they like to be referred to a provider to establish care? No . Patient declines referral. Will speak with her daughter and schedule.  Dental Screening: Recommended annual dental exams for proper oral hygiene  Diabetic Foot Exam: n/a  Community Resource Referral / Chronic Care Management: CRR required this visit?  No   CCM required this visit?  No     Plan:     I have personally  reviewed and noted the following in the patient's chart:   Medical and social history Use of alcohol, tobacco or illicit drugs  Current medications and supplements including opioid prescriptions. Patient is not currently taking opioid prescriptions. Functional ability and status Nutritional status Physical activity Advanced directives List of other physicians Hospitalizations, surgeries, and ER visits in previous 12 months Vitals Screenings to include cognitive, depression, and falls Referrals and appointments  In addition, I have reviewed and discussed with patient certain preventive protocols, quality metrics, and best practice recommendations. A written personalized care plan for preventive services as well as general preventive health recommendations were provided to patient.     Tora Kindred, CMA   05/08/2023   After Visit Summary: (MyChart) Due to this being a telephonic visit, the after visit summary with patients personalized plan was offered to patient via MyChart   Nurse Notes:  Patient past due for eye exam. Patient declined referral. She states she will speak with her daughter and schedule appointment where she goes. Patient has forms for ACP and will  plan to complete with her daughters help.

## 2023-05-08 NOTE — Patient Instructions (Signed)
Erica Navarro , Thank you for taking time to come for your Medicare Wellness Visit. I appreciate your ongoing commitment to your health goals. Please review the following plan we discussed and let me know if I can assist you in the future.   These are the goals we discussed:  Goals       Exercise 3x per week (30 min per time) (pt-stated)      Be more consistent with exercise        This is a list of the screening recommended for you and due dates:  Health Maintenance  Topic Date Due   DTaP/Tdap/Td vaccine (1 - Tdap) Never done   Zoster (Shingles) Vaccine (1 of 2) 08/24/1999   COVID-19 Vaccine (6 - 2023-24 season) 06/23/2022   Flu Shot  05/24/2023   Mammogram  07/18/2023   Medicare Annual Wellness Visit  05/07/2024   DEXA scan (bone density measurement)  07/17/2024   Colon Cancer Screening  07/20/2026   Pneumonia Vaccine  Completed   Hepatitis C Screening  Completed   HPV Vaccine  Aged Out    Advanced directives: Complete forms and bring to Dr. Cyndie Chime office to scan into your chart.  Conditions/risks identified: Make appointment for eye exam.  Next appointment: Follow up in one year for your annual wellness visit 05/13/24   Preventive Care 65 Years and Older, Female Preventive care refers to lifestyle choices and visits with your health care provider that can promote health and wellness. What does preventive care include? A yearly physical exam. This is also called an annual well check. Dental exams once or twice a year. Routine eye exams. Ask your health care provider how often you should have your eyes checked. Personal lifestyle choices, including: Daily care of your teeth and gums. Regular physical activity. Eating a healthy diet. Avoiding tobacco and drug use. Limiting alcohol use. Practicing safe sex. Taking low-dose aspirin every day. Taking vitamin and mineral supplements as recommended by your health care provider. What happens during an annual well  check? The services and screenings done by your health care provider during your annual well check will depend on your age, overall health, lifestyle risk factors, and family history of disease. Counseling  Your health care provider may ask you questions about your: Alcohol use. Tobacco use. Drug use. Emotional well-being. Home and relationship well-being. Sexual activity. Eating habits. History of falls. Memory and ability to understand (cognition). Work and work Astronomer. Reproductive health. Screening  You may have the following tests or measurements: Height, weight, and BMI. Blood pressure. Lipid and cholesterol levels. These may be checked every 5 years, or more frequently if you are over 38 years old. Skin check. Lung cancer screening. You may have this screening every year starting at age 91 if you have a 30-pack-year history of smoking and currently smoke or have quit within the past 15 years. Fecal occult blood test (FOBT) of the stool. You may have this test every year starting at age 49. Flexible sigmoidoscopy or colonoscopy. You may have a sigmoidoscopy every 5 years or a colonoscopy every 10 years starting at age 46. Hepatitis C blood test. Hepatitis B blood test. Sexually transmitted disease (STD) testing. Diabetes screening. This is done by checking your blood sugar (glucose) after you have not eaten for a while (fasting). You may have this done every 1-3 years. Bone density scan. This is done to screen for osteoporosis. You may have this done starting at age 6. Mammogram. This may be done  every 1-2 years. Talk to your health care provider about how often you should have regular mammograms. Talk with your health care provider about your test results, treatment options, and if necessary, the need for more tests. Vaccines  Your health care provider may recommend certain vaccines, such as: Influenza vaccine. This is recommended every year. Tetanus, diphtheria, and  acellular pertussis (Tdap, Td) vaccine. You may need a Td booster every 10 years. Zoster vaccine. You may need this after age 71. Pneumococcal 13-valent conjugate (PCV13) vaccine. One dose is recommended after age 85. Pneumococcal polysaccharide (PPSV23) vaccine. One dose is recommended after age 36. Talk to your health care provider about which screenings and vaccines you need and how often you need them. This information is not intended to replace advice given to you by your health care provider. Make sure you discuss any questions you have with your health care provider. Document Released: 11/05/2015 Document Revised: 06/28/2016 Document Reviewed: 08/10/2015 Elsevier Interactive Patient Education  2017 [employer].  Fall Prevention in the Home Falls can cause injuries. They can happen to people of all ages. There are many things you can do to make your home safe and to help prevent falls. What can I do on the outside of my home? Regularly fix the edges of walkways and driveways and fix any cracks. Remove anything that might make you trip as you walk through a door, such as a raised step or threshold. Trim any bushes or trees on the path to your home. Use bright outdoor lighting. Clear any walking paths of anything that might make someone trip, such as rocks or tools. Regularly check to see if handrails are loose or broken. Make sure that both sides of any steps have handrails. Any raised decks and porches should have guardrails on the edges. Have any leaves, snow, or ice cleared regularly. Use sand or salt on walking paths during winter. Clean up any spills in your garage right away. This includes oil or grease spills. What can I do in the bathroom? Use night lights. Install grab bars by the toilet and in the tub and shower. Do not use towel bars as grab bars. Use non-skid mats or decals in the tub or shower. If you need to sit down in the shower, use a plastic, non-slip stool. Keep  the floor dry. Clean up any water that spills on the floor as soon as it happens. Remove soap buildup in the tub or shower regularly. Attach bath mats securely with double-sided non-slip rug tape. Do not have throw rugs and other things on the floor that can make you trip. What can I do in the bedroom? Use night lights. Make sure that you have a light by your bed that is easy to reach. Do not use any sheets or blankets that are too big for your bed. They should not hang down onto the floor. Have a firm chair that has side arms. You can use this for support while you get dressed. Do not have throw rugs and other things on the floor that can make you trip. What can I do in the kitchen? Clean up any spills right away. Avoid walking on wet floors. Keep items that you use a lot in easy-to-reach places. If you need to reach something above you, use a strong step stool that has a grab bar. Keep electrical cords out of the way. Do not use floor polish or wax that makes floors slippery. If you must use wax, use  non-skid floor wax. Do not have throw rugs and other things on the floor that can make you trip. What can I do with my stairs? Do not leave any items on the stairs. Make sure that there are handrails on both sides of the stairs and use them. Fix handrails that are broken or loose. Make sure that handrails are as long as the stairways. Check any carpeting to make sure that it is firmly attached to the stairs. Fix any carpet that is loose or worn. Avoid having throw rugs at the top or bottom of the stairs. If you do have throw rugs, attach them to the floor with carpet tape. Make sure that you have a light switch at the top of the stairs and the bottom of the stairs. If you do not have them, ask someone to add them for you. What else can I do to help prevent falls? Wear shoes that: Do not have high heels. Have rubber bottoms. Are comfortable and fit you well. Are closed at the toe. Do not  wear sandals. If you use a stepladder: Make sure that it is fully opened. Do not climb a closed stepladder. Make sure that both sides of the stepladder are locked into place. Ask someone to hold it for you, if possible. Clearly mark and make sure that you can see: Any grab bars or handrails. First and last steps. Where the edge of each step is. Use tools that help you move around (mobility aids) if they are needed. These include: Canes. Walkers. Scooters. Crutches. Turn on the lights when you go into a dark area. Replace any light bulbs as soon as they burn out. Set up your furniture so you have a clear path. Avoid moving your furniture around. If any of your floors are uneven, fix them. If there are any pets around you, be aware of where they are. Review your medicines with your doctor. Some medicines can make you feel dizzy. This can increase your chance of falling. Ask your doctor what other things that you can do to help prevent falls. This information is not intended to replace advice given to you by your health care provider. Make sure you discuss any questions you have with your health care provider. Document Released: 08/05/2009 Document Revised: 03/16/2016 Document Reviewed: 11/13/2014 Elsevier Interactive Patient Education  2017 [employer].

## 2023-05-27 ENCOUNTER — Other Ambulatory Visit: Payer: Self-pay

## 2023-05-27 ENCOUNTER — Encounter (HOSPITAL_BASED_OUTPATIENT_CLINIC_OR_DEPARTMENT_OTHER): Payer: Self-pay

## 2023-05-27 ENCOUNTER — Emergency Department (HOSPITAL_BASED_OUTPATIENT_CLINIC_OR_DEPARTMENT_OTHER)
Admission: EM | Admit: 2023-05-27 | Discharge: 2023-05-27 | Disposition: A | Discharge status: Home / Self Care | Payer: Medicare HMO | Attending: Emergency Medicine | Admitting: Emergency Medicine

## 2023-05-27 DIAGNOSIS — I10 Essential (primary) hypertension: Secondary | ICD-10-CM | POA: Diagnosis not present

## 2023-05-27 DIAGNOSIS — Z7982 Long term (current) use of aspirin: Secondary | ICD-10-CM | POA: Insufficient documentation

## 2023-05-27 DIAGNOSIS — Z91040 Latex allergy status: Secondary | ICD-10-CM | POA: Insufficient documentation

## 2023-05-27 DIAGNOSIS — Z79899 Other long term (current) drug therapy: Secondary | ICD-10-CM | POA: Diagnosis not present

## 2023-05-27 DIAGNOSIS — M5410 Radiculopathy, site unspecified: Secondary | ICD-10-CM

## 2023-05-27 DIAGNOSIS — M5416 Radiculopathy, lumbar region: Secondary | ICD-10-CM | POA: Diagnosis not present

## 2023-05-27 DIAGNOSIS — M25512 Pain in left shoulder: Secondary | ICD-10-CM | POA: Diagnosis not present

## 2023-05-27 DIAGNOSIS — M545 Low back pain, unspecified: Secondary | ICD-10-CM | POA: Insufficient documentation

## 2023-05-27 MED ORDER — METHYLPREDNISOLONE 4 MG PO TBPK: ORAL_TABLET | ORAL | 0 refills | Status: DC

## 2023-05-27 MED ORDER — CYCLOBENZAPRINE HCL 5 MG PO TABS: 5.0000 mg | ORAL_TABLET | Freq: Three times a day (TID) | ORAL | 0 refills | Status: DC | PRN

## 2023-05-27 NOTE — ED Triage Notes (Signed)
Patient has had lower back pain for a week that has progressively gotten worse. She stated it is going into her left leg. She stated the pain increases when she gets up.

## 2023-05-27 NOTE — ED Provider Notes (Signed)
[facility] Provider Note   CSN: [id number] Arrival date & time: 05/27/23  0908     History  Chief Complaint  Patient presents with   Back Pain    Erica Navarro is a 72 y.o. female.  HPI      Send 8-year-old female with a history of hypertension, hyperlipidemia, osteoporosis, parathyroidectomy June 2023, prior joint pain who presents with concern for back pain with radiation down the left leg.  Reports that she had previously had some left hip pain and been diagnosed with bursitis by Dr. Dallas Schimke, also and had some left shoulder pain for which she is seeing orthopedics.  She is given a prescription for celecoxib by Dr. Dallas Schimke and a refill in April.  At the time she was having the pain it was in her left hip and radiating down her leg.  This time it started off in her left hip about a week ago, however was tolerable and she put Biofreeze over the area and felt better, however over the starting yesterday began to have midline and left lower back pain with shooting pain down the left leg.  Reports that it goes through the hip and down the side of her left leg.  She denies any numbness, weakness, loss control of bowel or bladder, fever, cancer history, history of IV drug use or history of trauma or falls.  She works as a Arboriculturist at [employer] and notes that she will bend over to clean things often and that her pain is exacerbated by certain movements.  The pain is severe.  She has tried taking the celecoxib her physician had written her in April, reports some improvement with that, and some improvement with Biofreeze, but due to worsening pain over the last 2 days came to the emergency department for further evaluation.  Denies abdominal pain, dysuria, nausea, vomiting, fevers.   Past Medical History:  Diagnosis Date   Arthritis    Hyperlipidemia    Hypertension    Hyperthyroidism     Home Medications Prior to Admission medications    Medication Sig Start Date End Date Taking? Authorizing Provider  cyclobenzaprine (FLEXERIL) 5 MG tablet Take 1-2 tablets (5-10 mg total) by mouth 3 (three) times daily as needed for muscle spasms. 05/27/23  Yes Alvira Monday, MD  methylPREDNISolone (MEDROL DOSEPAK) 4 MG TBPK tablet See instructions 05/27/23  Yes Alvira Monday, MD  acetaminophen (TYLENOL) 500 MG tablet Take 1,000 mg by mouth every 6 (six) hours as needed (pain.).    [provider]  amLODipine (NORVASC) 10 MG tablet TAKE 1 TABLET BY MOUTH EVERY DAY 05/23/22   Copland, Gwenlyn Found, MD  aspirin 81 MG EC tablet Take 81 mg by mouth in the morning.    [provider]  celecoxib (CELEBREX) 100 MG capsule Take 1 capsule (100 mg total) by mouth 2 (two) times daily as needed. Patient not taking: Reported on 05/08/2023 10/26/22   Copland, Gwenlyn Found, MD  Cholecalciferol (VITAMIN D3) 50 MCG (2000 UT) TABS Take 2,000 Units by mouth in the morning.    [provider]  Cholecalciferol 1000 UNITS capsule Take 1,000 Units by mouth in the morning.    [provider]  denosumab (PROLIA) 60 MG/ML SOSY injection Inject 60 mg into the skin every 6 (six) months.    [provider]  pravastatin (PRAVACHOL) 20 MG tablet Take 1 tablet (20 mg total) by mouth daily. Ok to take every other day if needed  06/28/22   Copland, Gwenlyn Found, MD      Allergies    Latex, Pravachol [pravastatin], and Zestril [lisinopril]    Review of Systems   Review of Systems  Physical Exam Updated Vital Signs BP (!) 144/74 (BP Location: Left Arm)   Pulse 71   Temp 97.9 F (36.6 C) (Oral)   Resp 18   Ht 5\' 3"  (1.6 m)   Wt 81.6 kg   SpO2 98%   BMI 31.89 kg/m  Physical Exam Vitals and nursing note reviewed.  Constitutional:      General: She is not in acute distress.    Appearance: She is well-developed. She is not diaphoretic.  HENT:     Head: Normocephalic and atraumatic.  Eyes:     Conjunctiva/sclera: Conjunctivae normal.   Cardiovascular:     Rate and Rhythm: Normal rate and regular rhythm.     Pulses: Normal pulses.  Pulmonary:     Effort: Pulmonary effort is normal. No respiratory distress.     Breath sounds: Normal breath sounds. No wheezing or rales.  Abdominal:     General: There is no distension.     Palpations: Abdomen is soft.     Tenderness: There is no abdominal tenderness. There is no guarding.  Musculoskeletal:        General: Tenderness (left lower back, midline lumbar spine) present.     Cervical back: Normal range of motion.  Skin:    General: Skin is warm and dry.     Findings: No erythema or rash.  Neurological:     Mental Status: She is alert and oriented to person, place, and time.     Sensory: No sensory deficit.     Motor: No weakness (normal lower extremity strength).     ED Results / Procedures / Treatments   Labs (all labs ordered are listed, but only abnormal results are displayed) Labs Reviewed - No data to display  EKG None  Radiology No results found.  Procedures Procedures    Medications Ordered in ED Medications - No data to display  ED Course/ Medical Decision Making/ A&P                                   72 year old female with a history of hypertension, hyperlipidemia, osteoporosis, parathyroidectomy June 2023, prior joint pain who presents with concern for back pain with radiation down the left leg.    Patient has a normal neurologic exam and denies any urinary retention or overflow incontinence, stool incontinence, saddle anesthesia, fever, IV drug use, trauma, chronic steroid use or immunocompromise and have low suspicion suspicion for cauda equina, fracture, epidural abscess, or vertebral osteomyelitis. No falls, trauma, given radicular pain suspect most likely disc herniation, do not feel emergent imaging indicated at this time.  No urinary symptoms, doubt UTI or pyelonephritis.  No abdominal pain and doubt intra-abdominal cause.  Normal  bilateral pulses and have low suspicion for acute arterial thrombus, no asymmetric leg swelling and doubt DVT.  History and exam is most consistent with radicular pain, most likely from degenerative disc disease.  Will give Medrol Dosepak, prescription for muscle relaxant, and recommend close follow-up with PCP for continued discussion of pain control, physical therapy, and consideration of outpatient imaging/spine follow up if symptoms continue.  Discussed can use her previously prescribed celecoxib as well as tylenol. Patient discharged in stable condition with understanding of reasons to return.  Final Clinical Impression(s) / ED Diagnoses Final diagnoses:  Radicular low back pain    Rx / DC Orders ED Discharge Orders          Ordered    methylPREDNISolone (MEDROL DOSEPAK) 4 MG TBPK tablet        05/27/23 0952    cyclobenzaprine (FLEXERIL) 5 MG tablet  3 times daily PRN        05/27/23 1610              Alvira Monday, MD 05/27/23 1014

## 2023-06-08 NOTE — Progress Notes (Unsigned)
[facility] [address], [address] [place], [place] [address] [phone]  Date:  06/11/2023   Name:  Erica Navarro   DOB:  09-23-1951   MRN:  [id number]  PCP:  Pearline Cables, MD    Chief Complaint: No chief complaint on file.   History of Present Illness:  Erica Navarro is a 72 y.o. very pleasant female patient who presents with the following:  Pt seen today for ER recheck- history of hypertension, hyperparathyroidism, thyroid nodule, hyperlipidemia, osteoporosis'   Last seen by myself in January  She was in the ER on 8/4 with concern of back pain: History and exam is most consistent with radicular pain, most likely from degenerative disc disease.  Will give Medrol Dosepak, prescription for muscle relaxant, and recommend close follow-up with PCP for continued discussion of pain control, physical therapy, and consideration of outpatient imaging/spine follow up if symptoms continue.  Discussed can use her previously prescribed celecoxib as well as tylenol. Patient discharged in stable condition with understanding of reasons to return.   Patient Active Problem List   Diagnosis Date Noted   Right thyroid nodule 06/13/2019   Ventral hernia without obstruction or gangrene 10/30/2016   Sedentary lifestyle 10/30/2016   Osteoporosis 06/02/2016   Vitamin D insufficiency 06/02/2016   Hyperparathyroidism, primary (HCC) 06/04/2015   HTN (hypertension) 11/11/2012   High cholesterol 11/11/2012    Past Medical History:  Diagnosis Date   Arthritis    Hyperlipidemia    Hypertension    Hyperthyroidism     Past Surgical History:  Procedure Laterality Date   COLONOSCOPY  08/10/2019   PARATHYROIDECTOMY Left 03/28/2019   Procedure: LEFT PARATHYROIDECTOMY;  Surgeon: Darnell Level, MD;  Location: [facility];  Service: General;  Laterality: Left;   PARATHYROIDECTOMY Right 04/06/2022   Procedure: RIGHT INFERIOR PARATHYROIDECTOMY;  Surgeon:  Darnell Level, MD;  Location: [facility];  Service: General;  Laterality: Right;   POLYPECTOMY      Social History   Tobacco Use   Smoking status: Never   Smokeless tobacco: Never  Vaping Use   Vaping status: Never Used  Substance Use Topics   Alcohol use: No   Drug use: No    Family History  Problem Relation Age of Onset   Heart attack Mother    Hypertension Sister    Hypertension Sister    Heart attack Brother    Heart attack Brother    Colon cancer Neg Hx    Colon polyps Neg Hx    Esophageal cancer Neg Hx    Rectal cancer Neg Hx    Stomach cancer Neg Hx     Allergies  Allergen Reactions   Latex Itching   Pravachol [Pravastatin]     Body aches    Zestril [Lisinopril] Swelling    hands and face swell    Medication list has been reviewed and updated.  Current Outpatient Medications on File Prior to Visit  Medication Sig Dispense Refill   acetaminophen (TYLENOL) 500 MG tablet Take 1,000 mg by mouth every 6 (six) hours as needed (pain.).     amLODipine (NORVASC) 10 MG tablet TAKE 1 TABLET BY MOUTH EVERY DAY 90 tablet 4   aspirin 81 MG EC tablet Take 81 mg by mouth in the morning.     celecoxib (CELEBREX) 100 MG capsule Take 1 capsule (100 mg total) by mouth 2 (two) times daily as needed. (Patient not taking: Reported on 05/08/2023) 60 capsule  1   Cholecalciferol (VITAMIN D3) 50 MCG (2000 UT) TABS Take 2,000 Units by mouth in the morning.     Cholecalciferol 1000 UNITS capsule Take 1,000 Units by mouth in the morning.     cyclobenzaprine (FLEXERIL) 5 MG tablet Take 1-2 tablets (5-10 mg total) by mouth 3 (three) times daily as needed for muscle spasms. 30 tablet 0   denosumab (PROLIA) 60 MG/ML SOSY injection Inject 60 mg into the skin every 6 (six) months.     methylPREDNISolone (MEDROL DOSEPAK) 4 MG TBPK tablet See instructions 21 each 0   pravastatin (PRAVACHOL) 20 MG tablet Take 1 tablet (20 mg total) by mouth daily. Ok to take every other day if needed 90 each 3   No  current facility-administered medications on file prior to visit.    Review of Systems:  As per HPI- otherwise negative.   Physical Examination: There were no vitals filed for this visit. There were no vitals filed for this visit. There is no height or weight on file to calculate BMI. Ideal Body Weight:    GEN: no acute distress. HEENT: Atraumatic, Normocephalic.  Ears and Nose: No external deformity. CV: RRR, No M/G/R. No JVD. No thrill. No extra heart sounds. PULM: CTA B, no wheezes, crackles, rhonchi. No retractions. No resp. distress. No accessory muscle use. ABD: S, NT, ND, +BS. No rebound. No HSM. EXTR: No c/c/e PSYCH: Normally interactive. Conversant.    Assessment and Plan: ***  Signed Abbe Amsterdam, MD

## 2023-06-11 ENCOUNTER — Ambulatory Visit: Payer: Medicare HMO | Admitting: Family Medicine

## 2023-06-11 VITALS — BP 132/80 | HR 82 | Temp 98.1°F | Resp 18 | Ht 63.0 in | Wt 180.8 lb

## 2023-06-11 DIAGNOSIS — R7309 Other abnormal glucose: Secondary | ICD-10-CM

## 2023-06-11 DIAGNOSIS — I10 Essential (primary) hypertension: Secondary | ICD-10-CM

## 2023-06-11 DIAGNOSIS — M545 Low back pain, unspecified: Secondary | ICD-10-CM | POA: Diagnosis not present

## 2023-06-11 DIAGNOSIS — E782 Mixed hyperlipidemia: Secondary | ICD-10-CM | POA: Diagnosis not present

## 2023-06-11 DIAGNOSIS — E7800 Pure hypercholesterolemia, unspecified: Secondary | ICD-10-CM

## 2023-06-11 DIAGNOSIS — Z1329 Encounter for screening for other suspected endocrine disorder: Secondary | ICD-10-CM

## 2023-06-11 MED ORDER — PRAVASTATIN SODIUM 20 MG PO TABS: 20.0000 mg | ORAL_TABLET | Freq: Every day | ORAL | 3 refills | Status: DC

## 2023-06-11 NOTE — Patient Instructions (Signed)
It was great to see you today- I will be in touch with your labs asap You may be due a tetanus booster which you can do at your pharmacy  Also recommend covid booster and flu shot this fall

## 2023-06-12 ENCOUNTER — Encounter: Payer: Self-pay | Admitting: Family Medicine

## 2023-06-12 DIAGNOSIS — E785 Hyperlipidemia, unspecified: Secondary | ICD-10-CM

## 2023-06-12 LAB — LIPID PANEL
Cholesterol: 294 mg/dL — ABNORMAL HIGH (ref 0–200)
HDL: 59.6 mg/dL (ref >39.00)
LDL Cholesterol: 203 mg/dL — ABNORMAL HIGH (ref 0–99)
NonHDL: 234.29
Total CHOL/HDL Ratio: 5
Triglycerides: 154 mg/dL — ABNORMAL HIGH (ref 0.0–149.0)
VLDL: 30.8 mg/dL (ref 0.0–40.0)

## 2023-06-12 LAB — COMPREHENSIVE METABOLIC PANEL
ALT: 11 U/L (ref 0–35)
AST: 16 U/L (ref 0–37)
Albumin: 4.1 g/dL (ref 3.5–5.2)
Alkaline Phosphatase: 68 U/L (ref 39–117)
BUN: 14 mg/dL (ref 6–23)
CO2: 28 meq/L (ref 19–32)
Calcium: 9.4 mg/dL (ref 8.4–10.5)
Chloride: 102 meq/L (ref 96–112)
Creatinine, Ser: 0.59 mg/dL (ref 0.40–1.20)
GFR: 89.17 mL/min (ref >60.00)
Glucose, Bld: 106 mg/dL — ABNORMAL HIGH (ref 70–99)
Potassium: 4.3 mEq/L (ref 3.5–5.1)
Sodium: 140 meq/L (ref 135–145)
Total Bilirubin: 0.3 mg/dL (ref 0.2–1.2)
Total Protein: 7.3 g/dL (ref 6.0–8.3)

## 2023-06-12 LAB — CBC
HCT: 39.5 % (ref 36.0–46.0)
Hemoglobin: 12.8 g/dL (ref 12.0–15.0)
MCHC: 32.4 g/dL (ref 30.0–36.0)
MCV: 87.5 fl (ref 78.0–100.0)
Platelets: 326 10*3/uL (ref 150.0–400.0)
RBC: 4.52 Mil/uL (ref 3.87–5.11)
RDW: 14.2 % (ref 11.5–15.5)
WBC: 8.2 10*3/uL (ref 4.0–10.5)

## 2023-06-12 LAB — HEMOGLOBIN A1C: Hgb A1c MFr Bld: 5.8 % (ref 4.6–6.5)

## 2023-06-12 LAB — TSH: TSH: 0.88 u[IU]/mL (ref 0.35–5.50)

## 2023-06-12 MED ORDER — SIMVASTATIN 10 MG PO TABS: 10.0000 mg | ORAL_TABLET | Freq: Every day | ORAL | 6 refills | Status: DC

## 2023-08-18 ENCOUNTER — Other Ambulatory Visit: Payer: Self-pay | Admitting: Family Medicine

## 2023-08-18 DIAGNOSIS — I10 Essential (primary) hypertension: Secondary | ICD-10-CM

## 2023-09-12 NOTE — Telephone Encounter (Signed)
Prolia VOB initiated via [email or web address]  Next Prolia inj DUE: 09/27/23

## 2023-09-23 NOTE — Telephone Encounter (Signed)
Prior Authorization REQUIRED for PROLIA  PA PROCESS DETAILS: PA is required. PA can be initiated by calling [phone] or online at [email or web address].      Primary Insurance: [employer] Adv Gold Plus COVERAGE DETAILS: Prolia and administration will be subject to 20.0% coinsurance up to a $3,600.00  out of pocket max ($402.42 met). Once met, coverage increases to 100%. No deductible applies. The  benefits provided on this Verification of Benefits form are Medical Benefits and are the patient's In-Network  benefits for Prolia. If you would like Pharmacy Benefits for Prolia, please call [phone].

## 2023-09-24 ENCOUNTER — Ambulatory Visit: Payer: Medicare HMO

## 2023-09-24 NOTE — Telephone Encounter (Signed)
Prior Auth on file and valid  Prior Auth APPROVED PA# [id number] Valid: 03/14/23-10/23/23

## 2023-09-27 ENCOUNTER — Ambulatory Visit
Admission: RE | Admit: 2023-09-27 | Discharge: 2023-09-27 | Disposition: A | Discharge status: Home / Self Care | Payer: Medicare HMO | Source: Ambulatory Visit | Attending: Internal Medicine | Admitting: Internal Medicine

## 2023-09-27 VITALS — BP 156/84 | HR 80 | Temp 99.0°F | Resp 17

## 2023-09-27 DIAGNOSIS — M7072 Other bursitis of hip, left hip: Secondary | ICD-10-CM

## 2023-09-27 MED ORDER — PREDNISONE 20 MG PO TABS
40.0000 mg | ORAL_TABLET | Freq: Every day | ORAL | 0 refills | Status: DC
Start: 2023-09-28 — End: 2023-09-27

## 2023-09-27 MED ORDER — METHYLPREDNISOLONE ACETATE 80 MG/ML IJ SUSP: 40.0000 mg | Freq: Once | INTRAMUSCULAR | Status: DC

## 2023-09-27 MED ORDER — CELECOXIB 100 MG PO CAPS
100.0000 mg | ORAL_CAPSULE | Freq: Two times a day (BID) | ORAL | 0 refills | Status: DC | PRN
Start: 2023-09-27 — End: 2023-10-03

## 2023-09-27 NOTE — ED Notes (Signed)
Pharmacy notified of cancelling prednisone.

## 2023-09-27 NOTE — ED Triage Notes (Signed)
Pt presents with c/o rt side hip pain that started Monday, states she now has pain on the lt hip.   Home interventions: tylenol, muscle relaxer

## 2023-09-27 NOTE — ED Provider Notes (Signed)
[facility]    CSN: [id number] Arrival date & time: 09/27/23  1407      History   Chief Complaint Chief Complaint  Patient presents with   Hip Pain    Entered by patient    HPI Erica Navarro is a 72 y.o. female presents for hip pain.  Patient reports a history of left hip bursitis and states the past 3 days she has been having left hip pain.  Denies any injury or inciting event.  She reports this feels the same as her previous bursitis flares.  No numbness/tingling/weakness of her lower extremity, no low back pain.  No dysuria.  She has been taking Tylenol and a muscle relaxer with some improvement.  She was on Celebrex in the past but ran out of the medication and is requesting a refill.  She also reports she has been on steroids in the past for this as well.  No other concerns at this time.   Hip Pain    Past Medical History:  Diagnosis Date   Arthritis    Hyperlipidemia    Hypertension    Hyperthyroidism     Patient Active Problem List   Diagnosis Date Noted   Right thyroid nodule 06/13/2019   Ventral hernia without obstruction or gangrene 10/30/2016   Sedentary lifestyle 10/30/2016   Osteoporosis 06/02/2016   Vitamin D insufficiency 06/02/2016   Hyperparathyroidism, primary (HCC) 06/04/2015   HTN (hypertension) 11/11/2012   High cholesterol 11/11/2012    Past Surgical History:  Procedure Laterality Date   COLONOSCOPY  08/10/2019   PARATHYROIDECTOMY Left 03/28/2019   Procedure: LEFT PARATHYROIDECTOMY;  Surgeon: Darnell Level, MD;  Location: [facility];  Service: General;  Laterality: Left;   PARATHYROIDECTOMY Right 04/06/2022   Procedure: RIGHT INFERIOR PARATHYROIDECTOMY;  Surgeon: Darnell Level, MD;  Location: [facility];  Service: General;  Laterality: Right;   POLYPECTOMY      OB History   No obstetric history on file.      Home Medications    Prior to Admission medications   Medication Sig Start Date End Date Taking? Authorizing Provider  celecoxib  (CELEBREX) 100 MG capsule Take 1 capsule (100 mg total) by mouth 2 (two) times daily as needed. 09/27/23  Yes Radford Pax, NP  acetaminophen (TYLENOL) 500 MG tablet Take 1,000 mg by mouth every 6 (six) hours as needed (pain.).    [provider]  amLODipine (NORVASC) 10 MG tablet TAKE 1 TABLET BY MOUTH EVERY DAY 08/20/23   Copland, Gwenlyn Found, MD  aspirin 81 MG EC tablet Take 81 mg by mouth in the morning.    [provider]  Cholecalciferol (VITAMIN D3) 50 MCG (2000 UT) TABS Take 2,000 Units by mouth in the morning.    [provider]  Cholecalciferol 1000 UNITS capsule Take 1,000 Units by mouth in the morning.    [provider]  cyclobenzaprine (FLEXERIL) 5 MG tablet Take 1-2 tablets (5-10 mg total) by mouth 3 (three) times daily as needed for muscle spasms. 05/27/23   Alvira Monday, MD  denosumab (PROLIA) 60 MG/ML SOSY injection Inject 60 mg into the skin every 6 (six) months.    [provider]  pravastatin (PRAVACHOL) 20 MG tablet Take 1 tablet (20 mg total) by mouth daily. Ok to take every other day if needed 06/11/23   Copland, Gwenlyn Found, MD  simvastatin (ZOCOR) 10 MG tablet Take 1 tablet (10 mg total) by mouth at bedtime. 06/12/23   Copland, Gwenlyn Found, MD  Family History Family History  Problem Relation Age of Onset   Heart attack Mother    Hypertension Sister    Hypertension Sister    Heart attack Brother    Heart attack Brother    Colon cancer Neg Hx    Colon polyps Neg Hx    Esophageal cancer Neg Hx    Rectal cancer Neg Hx    Stomach cancer Neg Hx     Social History Social History   Tobacco Use   Smoking status: Never   Smokeless tobacco: Never  Vaping Use   Vaping status: Never Used  Substance Use Topics   Alcohol use: No   Drug use: No     Allergies   Latex, Pravachol [pravastatin], and Zestril [lisinopril]   Review of Systems Review of Systems  Musculoskeletal:        Left hip pain     Physical  Exam Triage Vital Signs ED Triage Vitals  Encounter Vitals Group     BP 09/27/23 1439 (!) 156/84     Systolic BP Percentile --      Diastolic BP Percentile --      Pulse Rate 09/27/23 1439 80     Resp 09/27/23 1439 17     Temp 09/27/23 1439 99 F (37.2 C)     Temp Source 09/27/23 1439 Oral     SpO2 09/27/23 1439 94 %     Weight --      Height --      Head Circumference --      Peak Flow --      Pain Score 09/27/23 1438 7     Pain Loc --      Pain Education --      Exclude from Growth Chart --    No data found.  Updated Vital Signs BP (!) 156/84 (BP Location: Right Arm)   Pulse 80   Temp 99 F (37.2 C) (Oral)   Resp 17   SpO2 94%   Visual Acuity Right Eye Distance:   Left Eye Distance:   Bilateral Distance:    Right Eye Near:   Left Eye Near:    Bilateral Near:     Physical Exam Vitals and nursing note reviewed.  Constitutional:      General: She is not in acute distress.    Appearance: Normal appearance. She is not ill-appearing.  HENT:     Head: Normocephalic and atraumatic.  Eyes:     Pupils: Pupils are equal, round, and reactive to light.  Cardiovascular:     Rate and Rhythm: Normal rate.  Pulmonary:     Effort: Pulmonary effort is normal.  Musculoskeletal:     Left hip: Tenderness present. No deformity, lacerations, bony tenderness or crepitus. Normal range of motion. Normal strength.       Legs:     Comments: Mild pain to the left hip with internal rotation.  Full range of motion of hip otherwise.  Strength 5 out of 5 bilateral lower extremities.  Skin:    General: Skin is warm and dry.  Neurological:     General: No focal deficit present.     Mental Status: She is alert and oriented to person, place, and time.  Psychiatric:        Mood and Affect: Mood normal.        Behavior: Behavior normal.      UC Treatments / Results  Labs (all labs ordered are listed, but only abnormal results are displayed)  Labs Reviewed - No data to  display  EKG   Radiology No results found.  Procedures Procedures (including critical care time)  Medications Ordered in UC Medications  methylPREDNISolone acetate (DEPO-MEDROL) injection 40 mg (has no administration in time range)    Initial Impression / Assessment and Plan / UC Course  I have reviewed the triage vital signs and the nursing notes.  Pertinent labs & imaging results that were available during my care of the patient were reviewed by me and considered in my medical decision making (see chart for details).     Reviewed exam and symptoms with patient.  No red flags.  Will defer steroids as patient does get her Prolia injection tomorrow.  She will follow-up with her provider to determine when she would be able to restart steroids.  Will refill Celebrex to take twice daily.Algis Downs PCP follow-up 2 to 3 days for recheck.  ER precautions reviewed. Final Clinical Impressions(s) / UC Diagnoses   Final diagnoses:  Bursitis of left hip, unspecified bursa     Discharge Instructions      You may take Celebrex twice daily as needed for your hip pain.  It did happen rest.  Please follow-up with your PCP in 2 days for recheck.  Please go to the ER for any worsening symptoms.  I hope you feel better soon!     ED Prescriptions     Medication Sig Dispense Auth. Provider   celecoxib (CELEBREX) 100 MG capsule Take 1 capsule (100 mg total) by mouth 2 (two) times daily as needed. 15 capsule Radford Pax, NP   predniSONE (DELTASONE) 20 MG tablet  (Status: Discontinued) Take 2 tablets (40 mg total) by mouth daily with breakfast for 5 days. 10 tablet Radford Pax, NP      PDMP not reviewed this encounter.   Radford Pax, NP 09/27/23 [phone]

## 2023-09-27 NOTE — Discharge Instructions (Addendum)
You may take Celebrex twice daily as needed for your hip pain.  It did happen rest.  Please follow-up with your PCP in 2 days for recheck.  Please go to the ER for any worsening symptoms.  I hope you feel better soon!

## 2023-09-28 ENCOUNTER — Ambulatory Visit: Payer: Medicare HMO

## 2023-09-28 DIAGNOSIS — M818 Other osteoporosis without current pathological fracture: Secondary | ICD-10-CM

## 2023-09-28 MED ORDER — DENOSUMAB 60 MG/ML ~~LOC~~ SOSY
60.0000 mg | PREFILLED_SYRINGE | Freq: Once | SUBCUTANEOUS | Status: AC
Start: 2024-03-28 — End: 2024-04-01
  Administered 2024-04-01: 60 mg via SUBCUTANEOUS

## 2023-09-28 MED ORDER — DENOSUMAB 60 MG/ML ~~LOC~~ SOSY
60.0000 mg | PREFILLED_SYRINGE | Freq: Once | SUBCUTANEOUS | Status: DC
Start: 2024-03-28 — End: 2023-09-28

## 2023-09-28 MED ORDER — DENOSUMAB 60 MG/ML ~~LOC~~ SOSY
60.0000 mg | PREFILLED_SYRINGE | Freq: Once | SUBCUTANEOUS | Status: DC
Start: 2023-09-28 — End: 2023-09-28

## 2023-09-28 MED ORDER — DENOSUMAB 60 MG/ML ~~LOC~~ SOSY
60.0000 mg | PREFILLED_SYRINGE | Freq: Once | SUBCUTANEOUS | Status: AC
Start: 2023-09-28 — End: 2023-09-28
  Administered 2023-09-28: 60 mg via SUBCUTANEOUS

## 2023-09-28 NOTE — Progress Notes (Signed)
After obtaining consent, and per orders of Dr. Elvera Lennox, injection of Prolia 60 mg given by Pollie Meyer. Patient instructed to remain in clinic for 20 minutes afterwards, and to report any adverse reaction to me immediately.

## 2023-10-01 NOTE — Telephone Encounter (Signed)
Last Prolia inj 09/28/23 Next Prolia inj due 03/29/24

## 2023-10-02 NOTE — Progress Notes (Unsigned)
[facility]rica [facility] at [facility] [address], [name], [place] [address] [phone]  Date:  10/03/2023   Name:  Erica Navarro   DOB:  [birth year]   MRN:  [id number]  PCP:  Erica Cables, MD    Chief Complaint: neck and back pain (Dx with Bursitis at [facility] on 09/27/23: Celebrex- pt says she has had some relief. )   History of Present Illness:  Erica Navarro is a 72 y.o. very pleasant female patient who presents with the following:  Patient seen today with concern of pain in her back and legs Most recent visit with myself was in August-  history of hypertension, hyperparathyroidism, thyroid nodule, hyperlipidemia, osteoporosis At her visit in August she had recently been seen in the ER with radicular back pain, but was doing better by the time of our visit  She notes that she felt tired after thanksgiving, though she was getting a cold. Her back hurt She has been dealing with pain in her left lower back and then in her right hip  She was seen in urgent care in December 5 with left hip pain thought to be bursitis. 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 Patient Active Problem List   Diagnosis Date Noted   Right thyroid nodule 06/13/2019   Ventral hernia without obstruction or gangrene 10/30/2016   Sedentary lifestyle 10/30/2016   Osteoporosis 06/02/2016   Vitamin D insufficiency 06/02/2016   Hyperparathyroidism, primary (HCC) 06/04/2015   HTN (hypertension) 11/11/2012    High cholesterol 11/11/2012    Past Medical History:  Diagnosis Date   Arthritis    Hyperlipidemia    Hypertension    Hyperthyroidism     Past Surgical History:  Procedure Laterality Date   COLONOSCOPY  08/10/2019   PARATHYROIDECTOMY Left 03/28/2019   Procedure: LEFT PARATHYROIDECTOMY;  Surgeon: Erica Level, MD;  Location: [facility];  Service: General;  Laterality: Left;   PARATHYROIDECTOMY Right 04/06/2022   Procedure: RIGHT INFERIOR PARATHYROIDECTOMY;  Surgeon: Erica Level, MD;  Location: [facility];  Service: General;  Laterality: Right;   POLYPECTOMY      Social History   Tobacco Use   Smoking status: Never   Smokeless tobacco: Never  Vaping Use   Vaping status: Never Used  Substance Use Topics   Alcohol use: No   Drug use: No    Family History  Problem Relation Age of Onset   Heart attack Mother    Hypertension Sister    Hypertension Sister    Heart attack Brother    Heart attack Brother    Colon cancer Neg Hx    Colon polyps Neg Hx    Esophageal cancer Neg Hx    Rectal cancer Neg Hx    Stomach cancer Neg Hx     Allergies  Allergen Reactions   Latex Itching   Pravachol [Pravastatin]     Body aches  Zestril [Lisinopril] Swelling    hands and face swell    Medication list has been reviewed and updated.  Current Outpatient Medications on File Prior to Visit  Medication Sig Dispense Refill   acetaminophen (TYLENOL) 500 MG tablet Take 1,000 mg by mouth every 6 (six) hours as needed (pain.).     amLODipine (NORVASC) 10 MG tablet TAKE 1 TABLET BY MOUTH EVERY DAY 90 tablet 4   aspirin 81 MG EC tablet Take 81 mg by mouth in the morning.     Cholecalciferol (VITAMIN D3) 50 MCG (2000 UT) TABS Take 2,000 Units by mouth in the morning.     Cholecalciferol 1000 UNITS capsule Take 1,000 Units by mouth in the morning.     cyclobenzaprine (FLEXERIL) 5 MG tablet Take 1-2 tablets (5-10 mg total) by mouth 3 (three) times daily as needed for muscle spasms. 30 tablet 0    denosumab (PROLIA) 60 MG/ML SOSY injection Inject 60 mg into the skin every 6 (six) months.     pravastatin (PRAVACHOL) 20 MG tablet Take 1 tablet (20 mg total) by mouth daily. Ok to take every other day if needed 90 tablet 3   simvastatin (ZOCOR) 10 MG tablet Take 1 tablet (10 mg total) by mouth at bedtime. 30 tablet 6   Current Facility-Administered Medications on File Prior to Visit  Medication Dose Route Frequency Provider Last Rate Last Admin   [START ON 03/28/2024] denosumab (PROLIA) injection 60 mg  60 mg Subcutaneous Once Erica Pavlov, MD        Review of Systems:  As per HPI- otherwise negative.   Physical Examination: Vitals:   10/03/23 1021  BP: 132/74  Pulse: 80  Resp: 18  Temp: 98 F (36.7 C)  SpO2: 98%   Vitals:   10/03/23 1021  Weight: 186 lb 9.6 oz (84.6 kg)  Height: 5\' 3"  (1.6 m)   Body mass index is 33.05 kg/m. Ideal Body Weight: Weight in (lb) to have BMI = 25: 140.8  GEN: no acute distress. Mild obesity, looks well  HEENT: Atraumatic, Normocephalic.  Ears and Nose: No external deformity. CV: RRR, No M/G/R. No JVD. No thrill. No extra heart sounds. PULM: CTA B, no wheezes, crackles, rhonchi. No retractions. No resp. distress. No accessory muscle use. ABD: S, NT, ND. No rebound. No HSM. EXTR: No c/c/e PSYCH: Normally interactive. Conversant.  Normal thoracolumbar flexion and extension.  Normal bilateral lower extremity strength, sensation, deep tendon reflex.  Negative straight leg raise bilaterally.  Exam of both hips is basically benign, she does have some mild tenderness over the bilateral greater trochanters Normal range of motion of right hip, no pain, swelling, redness or skin change in the groin area  Assessment and Plan: Right hip pain - Plan: DG Hip Unilat W OR W/O Pelvis 2-3 Views Right  Bursitis of left hip, unspecified bursa - Plan: celecoxib (CELEBREX) 100 MG capsule  Chronic bilateral low back pain without sciatica - Plan: DG Lumbar  Spine Complete  Patient seen today with concern of pain in her right hip and sometimes in her back.  She was seen in urgent care and ongoing given Celebrex which has been helpful.  I provided a refill of this for her to use as needed Will obtain x-rays of her lower back and right hip today  Signed Abbe Amsterdam, MD Received x-rays as below, message to patient  DG Lumbar Spine Complete  Result Date: 10/03/2023 CLINICAL DATA:  Low back pain.  Right hip pain. EXAM:  LUMBAR SPINE - COMPLETE 4+ VIEW COMPARISON:  None FINDINGS: Cause lumbar type vertebral bodies. Normal alignment. Disc space narrowing L4-5 and L5-S1. Mild facet osteoarthritis at L4-5 and L5-S1. No regional fracture. No advanced disease. The findings could, however, be symptomatic. IMPRESSION: Degenerative disc disease and facet osteoarthritis at L4-5 and L5-S1. No acute or traumatic finding. Electronically Signed   By: Paulina Fusi M.D.   On: 10/03/2023 12:06   DG Hip Unilat W OR W/O Pelvis 2-3 Views Right  Result Date: 10/03/2023 CLINICAL DATA:  Worsening right hip pain EXAM: DG HIP (WITH OR WITHOUT PELVIS) 2-3V RIGHT COMPARISON:  None Available. FINDINGS: Both hip joints appear normal. No joint space narrowing or osteophyte formation. No sign of avascular necrosis. Mild osteoarthritis affects the right sacroiliac joint. Mild arthropathy at the symphysis pubis. IMPRESSION: Normal appearance of both hip joints. Mild osteoarthritis of the right sacroiliac joint. Mild arthropathy at the symphysis pubis. Electronically Signed   By: Paulina Fusi M.D.   On: 10/03/2023 12:04

## 2023-10-03 ENCOUNTER — Ambulatory Visit (INDEPENDENT_AMBULATORY_CARE_PROVIDER_SITE_OTHER): Payer: Medicare HMO | Admitting: Family Medicine

## 2023-10-03 ENCOUNTER — Encounter: Payer: Self-pay | Admitting: Family Medicine

## 2023-10-03 ENCOUNTER — Other Ambulatory Visit (HOSPITAL_BASED_OUTPATIENT_CLINIC_OR_DEPARTMENT_OTHER): Payer: Self-pay | Admitting: Family Medicine

## 2023-10-03 ENCOUNTER — Ambulatory Visit (HOSPITAL_BASED_OUTPATIENT_CLINIC_OR_DEPARTMENT_OTHER)
Admission: RE | Admit: 2023-10-03 | Discharge: 2023-10-03 | Disposition: A | Discharge status: Home / Self Care | Payer: Medicare HMO | Source: Ambulatory Visit | Attending: Family Medicine | Admitting: Family Medicine

## 2023-10-03 VITALS — BP 132/74 | HR 80 | Temp 98.0°F | Resp 18 | Ht 63.0 in | Wt 186.6 lb

## 2023-10-03 DIAGNOSIS — G8929 Other chronic pain: Secondary | ICD-10-CM | POA: Diagnosis not present

## 2023-10-03 DIAGNOSIS — M545 Low back pain, unspecified: Secondary | ICD-10-CM | POA: Diagnosis not present

## 2023-10-03 DIAGNOSIS — M5136 Other intervertebral disc degeneration, lumbar region with discogenic back pain only: Secondary | ICD-10-CM | POA: Diagnosis not present

## 2023-10-03 DIAGNOSIS — M25551 Pain in right hip: Secondary | ICD-10-CM

## 2023-10-03 DIAGNOSIS — M7072 Other bursitis of hip, left hip: Secondary | ICD-10-CM | POA: Diagnosis not present

## 2023-10-03 DIAGNOSIS — M48061 Spinal stenosis, lumbar region without neurogenic claudication: Secondary | ICD-10-CM | POA: Diagnosis not present

## 2023-10-03 DIAGNOSIS — M1611 Unilateral primary osteoarthritis, right hip: Secondary | ICD-10-CM | POA: Diagnosis not present

## 2023-10-03 DIAGNOSIS — M47816 Spondylosis without myelopathy or radiculopathy, lumbar region: Secondary | ICD-10-CM | POA: Diagnosis not present

## 2023-10-03 DIAGNOSIS — Z139 Encounter for screening, unspecified: Secondary | ICD-10-CM

## 2023-10-03 MED ORDER — CELECOXIB 100 MG PO CAPS
100.0000 mg | ORAL_CAPSULE | Freq: Two times a day (BID) | ORAL | 0 refills | Status: DC | PRN
Start: 2023-10-03 — End: 2024-05-13

## 2023-10-03 NOTE — Patient Instructions (Addendum)
It was good to see you today- I will be in touch with your x-ray reports  Please let me know how your hip is doing over the next few days  Ok to use celebrex on an as needed basis Recommend flu shot, covid booster, shingles vaccine series

## 2023-10-04 ENCOUNTER — Ambulatory Visit (HOSPITAL_BASED_OUTPATIENT_CLINIC_OR_DEPARTMENT_OTHER)
Admission: RE | Admit: 2023-10-04 | Discharge: 2023-10-04 | Disposition: A | Discharge status: Home / Self Care | Payer: Medicare HMO | Source: Ambulatory Visit | Attending: Family Medicine | Admitting: Family Medicine

## 2023-10-04 ENCOUNTER — Encounter (HOSPITAL_BASED_OUTPATIENT_CLINIC_OR_DEPARTMENT_OTHER): Payer: Self-pay

## 2023-10-04 DIAGNOSIS — Z139 Encounter for screening, unspecified: Secondary | ICD-10-CM | POA: Diagnosis present

## 2023-10-04 DIAGNOSIS — Z1231 Encounter for screening mammogram for malignant neoplasm of breast: Secondary | ICD-10-CM | POA: Insufficient documentation

## 2023-12-06 ENCOUNTER — Other Ambulatory Visit: Payer: Self-pay | Admitting: Family Medicine

## 2023-12-06 DIAGNOSIS — E785 Hyperlipidemia, unspecified: Secondary | ICD-10-CM

## 2023-12-11 NOTE — Patient Instructions (Incomplete)
Good to see you today!  

## 2023-12-11 NOTE — Progress Notes (Deleted)
 [facility] at [facility] [address], [address] [place], [place] [address] [phone]  Date:  12/12/2023   Name:  Erica Navarro   DOB:  [birth year]   MRN:  [id number]  PCP:  Pearline Cables, MD    Chief Complaint: No chief complaint on file.   History of Present Illness:  Erica Navarro is a 73 y.o. very pleasant female patient who presents with the following:  Patient seen today for periodic follow-up- history of hypertension, hyperparathyroidism, thyroid nodule, hyperlipidemia, osteoporosis  Most recent visit with myself was in December when she was seen with back and hip pain  Flu vaccine COVID booster Second of Shingrix Mammogram up-to-date Colonoscopy up-to-date DEXA scan 9/23 Labs done in August  Amlodipine 10 Aspirin 81 Prolia Simvastatin Patient Active Problem List   Diagnosis Date Noted   Right thyroid nodule 06/13/2019   Ventral hernia without obstruction or gangrene 10/30/2016   Sedentary lifestyle 10/30/2016   Osteoporosis 06/02/2016   Vitamin D insufficiency 06/02/2016   Hyperparathyroidism, primary (HCC) 06/04/2015   HTN (hypertension) 11/11/2012   High cholesterol 11/11/2012    Past Medical History:  Diagnosis Date   Arthritis    Hyperlipidemia    Hypertension    Hyperthyroidism     Past Surgical History:  Procedure Laterality Date   COLONOSCOPY  08/10/2019   PARATHYROIDECTOMY Left 03/28/2019   Procedure: LEFT PARATHYROIDECTOMY;  Surgeon: Darnell Level, MD;  Location: [facility];  Service: General;  Laterality: Left;   PARATHYROIDECTOMY Right 04/06/2022   Procedure: RIGHT INFERIOR PARATHYROIDECTOMY;  Surgeon: Darnell Level, MD;  Location: [facility];  Service: General;  Laterality: Right;   POLYPECTOMY      Social History   Tobacco Use   Smoking status: Never   Smokeless tobacco: Never  Vaping Use   Vaping status: Never Used  Substance Use Topics   Alcohol use: No   Drug use: No    Family History   Problem Relation Age of Onset   Heart attack Mother    Hypertension Sister    Hypertension Sister    Heart attack Brother    Heart attack Brother    Colon cancer Neg Hx    Colon polyps Neg Hx    Esophageal cancer Neg Hx    Rectal cancer Neg Hx    Stomach cancer Neg Hx     Allergies  Allergen Reactions   Latex Itching   Pravachol [Pravastatin]     Body aches    Zestril [Lisinopril] Swelling    hands and face swell    Medication list has been reviewed and updated.  Current Outpatient Medications on File Prior to Visit  Medication Sig Dispense Refill   acetaminophen (TYLENOL) 500 MG tablet Take 1,000 mg by mouth every 6 (six) hours as needed (pain.).     amLODipine (NORVASC) 10 MG tablet TAKE 1 TABLET BY MOUTH EVERY DAY 90 tablet 4   aspirin 81 MG EC tablet Take 81 mg by mouth in the morning.     celecoxib (CELEBREX) 100 MG capsule Take 1 capsule (100 mg total) by mouth 2 (two) times daily as needed. 60 capsule 0   Cholecalciferol (VITAMIN D3) 50 MCG (2000 UT) TABS Take 2,000 Units by mouth in the morning.     Cholecalciferol 1000 UNITS capsule Take 1,000 Units by mouth in the morning.     cyclobenzaprine (FLEXERIL) 5 MG tablet Take 1-2 tablets (5-10 mg total) by mouth 3 (three) times daily  as needed for muscle spasms. 30 tablet 0   denosumab (PROLIA) 60 MG/ML SOSY injection Inject 60 mg into the skin every 6 (six) months.     pravastatin (PRAVACHOL) 20 MG tablet Take 1 tablet (20 mg total) by mouth daily. Ok to take every other day if needed 90 tablet 3   simvastatin (ZOCOR) 10 MG tablet TAKE 1 TABLET BY MOUTH EVERYDAY AT BEDTIME 90 tablet 2   Current Facility-Administered Medications on File Prior to Visit  Medication Dose Route Frequency Provider Last Rate Last Admin   [START ON 03/28/2024] denosumab (PROLIA) injection 60 mg  60 mg Subcutaneous Once Carlus Pavlov, MD        Review of Systems:  As per HPI- otherwise negative.   Physical Examination: There were no  vitals filed for this visit. There were no vitals filed for this visit. There is no height or weight on file to calculate BMI. Ideal Body Weight:    GEN: no acute distress. HEENT: Atraumatic, Normocephalic.  Ears and Nose: No external deformity. CV: RRR, No M/G/R. No JVD. No thrill. No extra heart sounds. PULM: CTA B, no wheezes, crackles, rhonchi. No retractions. No resp. distress. No accessory muscle use. ABD: S, NT, ND, +BS. No rebound. No HSM. EXTR: No c/c/e PSYCH: Normally interactive. Conversant.    Assessment and Plan: ***  Signed Abbe Amsterdam, MD

## 2023-12-12 ENCOUNTER — Ambulatory Visit: Payer: Medicare HMO | Admitting: Family Medicine

## 2023-12-14 NOTE — Patient Instructions (Addendum)
 It was great to see you again today If not done already, recommend shingles vaccine series at your pharmacy Also recommend COVID booster if none the last 6 months or so  Please check your BP once a day for a week or so and send me some readings if you would Please stop by lab and then imaging on the ground floor to set up your leg ultrasound

## 2023-12-14 NOTE — Progress Notes (Signed)
 [facility] at [facility] [address] [place], [place] [address] [phone]  Date:  12/17/2023   Name:  Erica Navarro   DOB:  10-11-1951   MRN:  [id number]  PCP:  Pearline Cables, MD    Chief Complaint: Follow-up (R Leg pain onset 12/08/2023 /Pt states both legs hurt, R leg started hurting more )   History of Present Illness:  Erica Navarro is a 73 y.o. very pleasant female patient who presents with the following:  Patient seen today for periodic follow-up- history of hypertension, hyperparathyroidism, thyroid nodule, hyperlipidemia, osteoporosis  Most recent visit with myself was in December when she was seen with back and hip pain  Flu vaccine- she declines today COVID booster- recommended  Shingrix- recommended  Mammogram up-to-date Colonoscopy up-to-date DEXA scan 9/23 Labs done in August  Amlodipine 10 Aspirin 81 Prolia Simvastatin  Today pt notes she may get aching and cramping in her legs- this will come and go Sometimes she has to use a cane She is using hot compresses which do seem to help  Actually better with walking most of the time  The pain is mostly in her calf muscles although she does have knee pain as well  She does not see ortho as of yet  Patient Active Problem List   Diagnosis Date Noted   Right thyroid nodule 06/13/2019   Ventral hernia without obstruction or gangrene 10/30/2016   Sedentary lifestyle 10/30/2016   Osteoporosis 06/02/2016   Vitamin D insufficiency 06/02/2016   Hyperparathyroidism, primary (HCC) 06/04/2015   HTN (hypertension) 11/11/2012   High cholesterol 11/11/2012    Past Medical History:  Diagnosis Date   Arthritis    Hyperlipidemia    Hypertension    Hyperthyroidism     Past Surgical History:  Procedure Laterality Date   COLONOSCOPY  08/10/2019   PARATHYROIDECTOMY Left 03/28/2019   Procedure: LEFT PARATHYROIDECTOMY;  Surgeon: Darnell Level, MD;  Location: [facility];   Service: General;  Laterality: Left;   PARATHYROIDECTOMY Right 04/06/2022   Procedure: RIGHT INFERIOR PARATHYROIDECTOMY;  Surgeon: Darnell Level, MD;  Location: [facility];  Service: General;  Laterality: Right;   POLYPECTOMY      Social History   Tobacco Use   Smoking status: Never   Smokeless tobacco: Never  Vaping Use   Vaping status: Never Used  Substance Use Topics   Alcohol use: No   Drug use: No    Family History  Problem Relation Age of Onset   Heart attack Mother    Hypertension Sister    Hypertension Sister    Heart attack Brother    Heart attack Brother    Colon cancer Neg Hx    Colon polyps Neg Hx    Esophageal cancer Neg Hx    Rectal cancer Neg Hx    Stomach cancer Neg Hx     Allergies  Allergen Reactions   Latex Itching   Pravachol [Pravastatin]     Body aches    Zestril [Lisinopril] Swelling    hands and face swell    Medication list has been reviewed and updated.  Current Outpatient Medications on File Prior to Visit  Medication Sig Dispense Refill   acetaminophen (TYLENOL) 500 MG tablet Take 1,000 mg by mouth every 6 (six) hours as needed (pain.).     amLODipine (NORVASC) 10 MG tablet TAKE 1 TABLET BY MOUTH EVERY DAY 90 tablet 4   aspirin 81 MG EC tablet Take  81 mg by mouth in the morning.     celecoxib (CELEBREX) 100 MG capsule Take 1 capsule (100 mg total) by mouth 2 (two) times daily as needed. 60 capsule 0   Cholecalciferol (VITAMIN D3) 50 MCG (2000 UT) TABS Take 2,000 Units by mouth in the morning.     Cholecalciferol 1000 UNITS capsule Take 1,000 Units by mouth in the morning.     cyclobenzaprine (FLEXERIL) 5 MG tablet Take 1-2 tablets (5-10 mg total) by mouth 3 (three) times daily as needed for muscle spasms. 30 tablet 0   denosumab (PROLIA) 60 MG/ML SOSY injection Inject 60 mg into the skin every 6 (six) months.     simvastatin (ZOCOR) 10 MG tablet TAKE 1 TABLET BY MOUTH EVERYDAY AT BEDTIME 90 tablet 2   pravastatin (PRAVACHOL) 20 MG tablet  Take 1 tablet (20 mg total) by mouth daily. Ok to take every other day if needed (Patient not taking: Reported on 12/17/2023) 90 tablet 3   Current Facility-Administered Medications on File Prior to Visit  Medication Dose Route Frequency Provider Last Rate Last Admin   [START ON 03/28/2024] denosumab (PROLIA) injection 60 mg  60 mg Subcutaneous Once Carlus Pavlov, MD        Review of Systems:  As per HPI- otherwise negative.   Physical Examination: Vitals:   12/17/23 1357  BP: (!) 144/84  Pulse: 87  SpO2: 98%   Vitals:   12/17/23 1357  Weight: 173 lb 6.4 oz (78.7 kg)  Height: 5\' 3"  (1.6 m)   Body mass index is 30.72 kg/m. Ideal Body Weight: Weight in (lb) to have BMI = 25: 140.8  GEN: no acute distress.  Mild obesity, looks well  HEENT: Atraumatic, Normocephalic.  Ears and Nose: No external deformity. CV: RRR, No M/G/R. No JVD. No thrill. No extra heart sounds. PULM: CTA B, no wheezes, crackles, rhonchi. No retractions. No resp. distress. No accessory muscle use. ABD: S, NT, ND, +BS. No rebound. No HSM. EXTR: No c/c/e PSYCH: Normally interactive. Conversant.  Strong pulses both feet, normal sensation to filament Normal strength of both LE   BP Readings from Last 3 Encounters:  12/17/23 (!) 144/84  10/03/23 132/74  09/27/23 (!) 156/84    Assessment and Plan: Bilateral calf pain - Plan: US Venous Img Lower Bilateral (DVT)  Essential hypertension - Plan: Basic metabolic panel  Paresthesia of both feet - Plan: B12, Iron, Magnesium  Thyroid disorder screening - Plan: TSH  Rule out DVT Rule out B12, iron, magnesium deficiency BP under ok control- asked her to check readings at home for a week and update me, may need to adjust medication  Signed Abbe Amsterdam, MD  Addendum 2/25, received labs as below.  Message to patient  Results for orders placed or performed in visit on 12/17/23  B12   Collection Time: 12/17/23  2:26 PM  Result Value Ref Range    Vitamin B-12 173 (L) 211 - 911 pg/mL  Iron   Collection Time: 12/17/23  2:26 PM  Result Value Ref Range   Iron 48 42 - 145 ug/dL  Magnesium   Collection Time: 12/17/23  2:26 PM  Result Value Ref Range   Magnesium 1.9 1.5 - 2.5 mg/dL  Basic metabolic panel   Collection Time: 12/17/23  2:26 PM  Result Value Ref Range   Sodium 140 135 - 145 mEq/L   Potassium 4.1 3.5 - 5.1 mEq/L   Chloride 103 96 - 112 mEq/L   CO2 28 19 -  32 mEq/L   Glucose, Bld 95 70 - 99 mg/dL   BUN 16 6 - 23 mg/dL   Creatinine, Ser 8.29 0.40 - 1.20 mg/dL   GFR 56.21 >30.86 mL/min   Calcium 9.3 8.4 - 10.5 mg/dL  TSH   Collection Time: 12/17/23  2:26 PM  Result Value Ref Range   TSH 1.28 0.35 - 5.50 uIU/mL

## 2023-12-17 ENCOUNTER — Ambulatory Visit (INDEPENDENT_AMBULATORY_CARE_PROVIDER_SITE_OTHER): Payer: Medicare HMO | Admitting: Family Medicine

## 2023-12-17 VITALS — BP 144/84 | HR 87 | Ht 63.0 in | Wt 173.4 lb

## 2023-12-17 DIAGNOSIS — M79662 Pain in left lower leg: Secondary | ICD-10-CM

## 2023-12-17 DIAGNOSIS — R202 Paresthesia of skin: Secondary | ICD-10-CM | POA: Diagnosis not present

## 2023-12-17 DIAGNOSIS — Z1329 Encounter for screening for other suspected endocrine disorder: Secondary | ICD-10-CM | POA: Diagnosis not present

## 2023-12-17 DIAGNOSIS — M79661 Pain in right lower leg: Secondary | ICD-10-CM | POA: Diagnosis not present

## 2023-12-17 DIAGNOSIS — I10 Essential (primary) hypertension: Secondary | ICD-10-CM | POA: Diagnosis not present

## 2023-12-18 ENCOUNTER — Encounter: Payer: Self-pay | Admitting: Family Medicine

## 2023-12-18 ENCOUNTER — Ambulatory Visit (HOSPITAL_BASED_OUTPATIENT_CLINIC_OR_DEPARTMENT_OTHER)
Admission: RE | Admit: 2023-12-18 | Discharge: 2023-12-18 | Disposition: A | Discharge status: Home / Self Care | Payer: Medicare HMO | Source: Ambulatory Visit | Attending: Family Medicine | Admitting: Family Medicine

## 2023-12-18 DIAGNOSIS — M79661 Pain in right lower leg: Secondary | ICD-10-CM | POA: Diagnosis not present

## 2023-12-18 DIAGNOSIS — M79662 Pain in left lower leg: Secondary | ICD-10-CM | POA: Insufficient documentation

## 2023-12-18 DIAGNOSIS — M7989 Other specified soft tissue disorders: Secondary | ICD-10-CM | POA: Diagnosis not present

## 2023-12-18 LAB — BASIC METABOLIC PANEL
BUN: 16 mg/dL (ref 6–23)
CO2: 28 meq/L (ref 19–32)
Calcium: 9.3 mg/dL (ref 8.4–10.5)
Chloride: 103 meq/L (ref 96–112)
Creatinine, Ser: 0.52 mg/dL (ref 0.40–1.20)
GFR: 91.59 mL/min (ref >60.00)
Glucose, Bld: 95 mg/dL (ref 70–99)
Potassium: 4.1 meq/L (ref 3.5–5.1)
Sodium: 140 meq/L (ref 135–145)

## 2023-12-18 LAB — VITAMIN B12: Vitamin B-12: 173 pg/mL — ABNORMAL LOW (ref 211–911)

## 2023-12-18 LAB — MAGNESIUM: Magnesium: 1.9 mg/dL (ref 1.5–2.5)

## 2023-12-18 LAB — TSH: TSH: 1.28 u[IU]/mL (ref 0.35–5.50)

## 2023-12-18 LAB — IRON: Iron: 48 ug/dL (ref 42–145)

## 2023-12-24 ENCOUNTER — Encounter: Payer: Self-pay | Admitting: Family Medicine

## 2023-12-25 NOTE — Progress Notes (Unsigned)
 Patient ID: Erica Navarro, female   DOB: 11/02/1950, 73 y.o.   MRN: [id number]  HPI  Erica Navarro is a 73 y.o.-year-old female, returning for f/u for h/o primary hyperparathyroidism - now s/p parathyroidectomy, vit D insufficiency, and OP. Last visit 1 year ago.  Interim history: No falls or fractures since last visit.  She continues to get steroid injections -previously had these in left knee, left shoulder, and left hip.  She has muscle and joint aches. No injections since last OV. No vertigo, dizziness, vision problems.  Reviewed and addended history: Pt was dx with hypercalcemia in 2016.  Reviewed pertinent labs:  Lab Results  Component Value Date   PTH 89 (H) 12/16/2021   PTH Comment 12/16/2021   PTH 91 (H) 06/13/2021   PTH 55 06/11/2020   PTH Comment 06/11/2020   PTH 30 06/13/2019   PTH Comment 06/13/2019   PTH 65 06/11/2018   PTH Comment 06/11/2018   PTH 46 06/01/2017   CALCIUM 9.3 12/17/2023   CALCIUM 9.4 06/11/2023   CALCIUM 9.4 10/26/2022   CALCIUM 9.5 06/28/2022   CALCIUM 10.3 03/29/2022   CALCIUM 10.9 (H) 12/19/2021   CALCIUM 11.1 (H) 12/16/2021   CALCIUM 10.9 (H) 06/13/2021   CALCIUM 11.5 (H) 12/08/2020   CALCIUM 10.6 (H) 06/11/2020   Osteoporosis:  We started Fosamax 70 mg weekly 2017.  No hip, thigh, jaw pain.   I advised her to stop Fosamax and start Prolia after the below results returned (06/2020).  She stopped Fosamax 05-03/2021 and was not able to start Prolia 2/2 insurance - finally started 09/07/2021.  No side effects. Prolia doses:  09/07/2021 03/09/2022 09/22/2022 03/27/2023 09/28/2023  Reviewed DXA scan reports: 07/17/2022 [facility])  Lumbar spine (L1-L3) Femoral neck (FN)  T-score - 2.2 RFN: - 1.5 LFN: - 1.9   07/16/2020 ([facility])  Lumbar spine (L1-L4) Femoral neck (FN)  T-score - 3.1  RFN: - 1.9 LFN: - 2.6   06/17/2018 ([facility])  Lumbar spine (L1-L4) Femoral neck (FN)  T-score - 3.1 RFN: - 1.9 LFN: - 2.0   12/03/2015 ([facility])  Lumbar spine (L1-L4)  Femoral neck (FN)  T-score - 3.6 RFN: - 2.1 LFN: - 2.4   No history of kidney stones.  No CKD. Last BUN/Cr: Lab Results  Component Value Date   BUN 16 12/17/2023   CREATININE 0.52 12/17/2023   She is not on HCTZ.  Vitamin D insufficiency, now resolved: Lab Results  Component Value Date   VD25OH 49.55 12/22/2022   VD25OH 50.57 12/16/2021   VD25OH 54.04 06/13/2021   VD25OH 62.75 03/08/2020   VD25OH 58.85 06/13/2019   VD25OH 50.51 05/30/2018   VD25OH 48.93 10/31/2017   VD25OH 47.02 06/01/2017   VD25OH 42.29 10/30/2016   VD25OH 36.55 06/02/2016   She continues on 3000 units vitamin D daily.  Pt does not have a FH of hypercalcemia, pituitary tumors, thyroid cancer, or osteoporosis.   Reviewed her initial labs: Component     Latest Ref Rng 12/04/2014  BUN     6 - 23 mg/dL 16  Creatinine     4.09 - 1.20 mg/dL 8.11  Calcium     8.4 - 10.5 mg/dL 91.4 (H)  GFR     >78.29 mL/min 121.86  Vitamin D 1, 25 (OH) Total     18 - 72 pg/mL 96 (H)  Vitamin D3 1, 25 (OH)      96  Vitamin D2 1, 25 (OH)      <8  PTH  15 - 65 pg/mL 82 (H)  Calcium Ionized     1.12 - 1.32 mmol/L 1.50 (H)  VITD     30.00 - 100.00 ng/mL 37.83  Phosphorus     2.3 - 4.6 mg/dL 2.7  Magnesium     1.5 - 2.5 mg/dL 1.9   Component     Latest Ref Rng 01/11/2015         3:03 PM  Calcium, Ur      4  Calcium, 24 hour urine     100 - 250 mg/day 112  Creatinine, Urine      50.4  Creatinine, 24H Ur     700 - 1800 mg/day 1412   Results pointed towards primary hyperparathyroidism. Her 24-hour urine calcium was not elevated. FeCa= 0.0045 (0.45%).  This was lower than expected from primary hyperparathyroidism, however, there was low suspicion for familial hypocalciuric hypercalcemia due to previously normal calcium levels and also normal magnesium levels.  She initially refused a referral to surgery, but afterwards agreed with a referral to Dr. Gerrit Friends.  Reviewed the results of her previous  tests: [employer] sestamibi scan (09/04/2018): Not localizing 4D CT (12/09/2018): Posterior left thyroid lobe mass measuring 6.9 mm  She had left posterior parathyroidectomy on 03/28/2019: Enlarged left posterior parathyroid gland: 721 mg, 2.1 cm in diameter  PTH and calcium remained elevated so I referred her back to surgery. She had right inferior parathyroidectomy 04/06/2022: 1.262 g parathyroid adenoma measuring 1.5 x 1.2 x 0.8 cm.  Thyroid nodules: Thyroid ultrasound (09/21/2018): 1 thyroid nodule 3.0 x 2.2 x 2.0, solid, isoechoic FNA of this nodule (10/01/2018): Benign Thyroid U/S (06/28/2021): Parenchymal Echotexture: Mildly heterogenous Isthmus: 0.3 cm Right lobe: 4.5 x 2.3 x 2.7 cm Left lobe: 3.3 x 1.5 x 1.4 cm  _________________________________________________________   The previously biopsied nodule in the right mid gland measures 3.6 x 2.2 x 2.0 cm compared to 3.4 x 2.2 x 2.0 cm previously. No significant interval change or new features identified. Incidental note is made of a tiny subcentimeter hypoechoic nodule more medially in the right mid gland. This finding does not meet criteria to warrant further evaluation.   No new nodules or suspicious features.   IMPRESSION: No significant interval change in the size or appearance of the previously biopsied nodule in the right mid gland. Recommend correlation with prior biopsy results.   No new nodules or suspicious features.  Pt denies: - feeling nodules in neck - hoarseness - dysphagia - choking  She also has a history of HTN, HL.  ROS: + see HPI  I reviewed pt's medications, allergies, PMH, social hx, family hx, and changes were documented in the history of present illness. Otherwise, unchanged from my initial visit note.  Past Medical History:  Diagnosis Date   Arthritis    Hyperlipidemia    Hypertension    Hyperthyroidism    Past Surgical History:  Procedure Laterality Date   COLONOSCOPY  08/10/2019    PARATHYROIDECTOMY Left 03/28/2019   Procedure: LEFT PARATHYROIDECTOMY;  Surgeon: Darnell Level, MD;  Location: [facility];  Service: General;  Laterality: Left;   PARATHYROIDECTOMY Right 04/06/2022   Procedure: RIGHT INFERIOR PARATHYROIDECTOMY;  Surgeon: Darnell Level, MD;  Location: [facility];  Service: General;  Laterality: Right;   POLYPECTOMY     History   Social History   Marital Status: Single    Spouse Name: N/A   Number of Children: 4   Occupational History   janitor   Social History Main Topics  Smoking status: Never Smoker    Smokeless tobacco: Not on file   Alcohol Use: No   Drug Use: No   Current Outpatient Medications on File Prior to Visit  Medication Sig Dispense Refill   acetaminophen (TYLENOL) 500 MG tablet Take 1,000 mg by mouth every 6 (six) hours as needed (pain.).     amLODipine (NORVASC) 10 MG tablet TAKE 1 TABLET BY MOUTH EVERY DAY 90 tablet 4   aspirin 81 MG EC tablet Take 81 mg by mouth in the morning.     celecoxib (CELEBREX) 100 MG capsule Take 1 capsule (100 mg total) by mouth 2 (two) times daily as needed. 60 capsule 0   Cholecalciferol (VITAMIN D3) 50 MCG (2000 UT) TABS Take 2,000 Units by mouth in the morning.     Cholecalciferol 1000 UNITS capsule Take 1,000 Units by mouth in the morning.     cyclobenzaprine (FLEXERIL) 5 MG tablet Take 1-2 tablets (5-10 mg total) by mouth 3 (three) times daily as needed for muscle spasms. 30 tablet 0   denosumab (PROLIA) 60 MG/ML SOSY injection Inject 60 mg into the skin every 6 (six) months.     pravastatin (PRAVACHOL) 20 MG tablet Take 1 tablet (20 mg total) by mouth daily. Ok to take every other day if needed (Patient not taking: Reported on 12/17/2023) 90 tablet 3   simvastatin (ZOCOR) 10 MG tablet TAKE 1 TABLET BY MOUTH EVERYDAY AT BEDTIME 90 tablet 2   Current Facility-Administered Medications on File Prior to Visit  Medication Dose Route Frequency Provider Last Rate Last Admin   [START ON 03/28/2024] denosumab (PROLIA)  injection 60 mg  60 mg Subcutaneous Once Carlus Pavlov, MD       Allergies  Allergen Reactions   Latex Itching   Pravachol [Pravastatin]     Body aches    Zestril [Lisinopril] Swelling    hands and face swell   FH: - see HPI + - HTN and heart ds in mother - HL in sister  PE: BP 130/70   Pulse 77   Ht 5\' 3"  (1.6 m)   Wt 169 lb 12.8 oz (77 kg)   SpO2 95%   BMI 30.08 kg/m    Wt Readings from Last 3 Encounters:  12/26/23 169 lb 12.8 oz (77 kg)  12/17/23 173 lb 6.4 oz (78.7 kg)  10/03/23 186 lb 9.6 oz (84.6 kg)   Constitutional: overweight, in NAD Eyes:EOMI, no exophthalmos ENT:  + L thyroid fullness, cervical scar healed, no cervical lymphadenopathy Cardiovascular: RRR, No MRG Respiratory: CTA B Musculoskeletal: no deformities Skin: no rashes Neurological: no tremor with outstretched hands  Assessment: 1. Primary hyperparathyroidism  2. Vitamin D insufficiency  3. OP  4. R Thyroid nodule  5. HTN  Plan: Patient with history of primary hyperparathyroidism, with elevated calcium levels as high as 11.5.  An intact PTH level was also high, at 104, for a calcium of 10.7.   -At the admission sestamibi scan was nonlocalizing, but a 4D CT showed a posterior left lobe mass consistent with a parathyroid adenoma.  She had parathyroidectomy in 03/2019 by Dr. Gerrit Friends.  The posterior left lobe mass turned out to be a hyperplastic parathyroid gland.  Calcium and PTH levels decreased after surgery but not to normal.  Afterwards, they started to increase. I referred her back to surgery and she had right inferior parathyroidectomy on 04/06/2022 by Dr. Gerrit Friends.  This showed a 1.262 g mass consistent with a parathyroid adenoma.  This measured 1.5  cm in the largest dimension. -Most recent calcium level was reviewed from 11/2023 and this was normal.  Will not repeat this today -I will see the patient back in 1 year  2. Vitamin D insufficiency -She can use on 3000 units vitamin D  daily -Vitamin D level was normal at last visit -We will repeat this now  3. OP -No falls or fractures since last visit -We started Fosamax 70 mg weekly in 2017.  She completed 4 years of treatment.  After this, I suggested Prolia.  She did stop Fosamax but she was not able to start Prolia until 08/2021, when she had her first dose.  She tolerates Prolia well, without new hip/thigh/jaw pain -She had lower T-scores at the level of her left femoral neck in 2021 but she had another DXA scan in 06/2022 which showed significant improvement in her T-scores -Will continue Prolia for now -We again discussed about trying to limit steroid injections if possible due to the negative effect on bone density and fracture risk.  However, she continues to have bursitis in the left hip and radicular low back pain.  Fortunately, she did not have to have steroid injections since last visit -Plan to get another bone density scan at next visit  4. Thyroid nodule -Patient with a history of a right mid thyroid nodule, isoechoic, solid, 3.6 cm in the largest dimension.  This was biopsied with benign results. -No neck compression symptoms at today's visit.  The right thyroid nodule is visible on swallowing, but it appears smaller than the ultrasound-calculated dimensions -Before last visit we checked another ultrasound and this showed that the nodule was approximately stable in size -Most recent TSH was reviewed and this was normal: Lab Results  Component Value Date   TSH 1.28 12/17/2023  -No intervention needed for now.  Plan to repeat another ultrasound next year.  Orders Placed This Encounter  Procedures   VITAMIN D 25 Hydroxy (Vit-D Deficiency, Fractures)   Carlus Pavlov, MD PhD [facility]

## 2023-12-26 ENCOUNTER — Ambulatory Visit: Payer: Medicare HMO | Admitting: Internal Medicine

## 2023-12-26 ENCOUNTER — Encounter: Payer: Self-pay | Admitting: Internal Medicine

## 2023-12-26 VITALS — BP 130/70 | HR 77 | Ht 63.0 in | Wt 169.8 lb

## 2023-12-26 DIAGNOSIS — E559 Vitamin D deficiency, unspecified: Secondary | ICD-10-CM

## 2023-12-26 DIAGNOSIS — M818 Other osteoporosis without current pathological fracture: Secondary | ICD-10-CM | POA: Diagnosis not present

## 2023-12-26 DIAGNOSIS — E210 Primary hyperparathyroidism: Secondary | ICD-10-CM | POA: Diagnosis not present

## 2023-12-26 DIAGNOSIS — E041 Nontoxic single thyroid nodule: Secondary | ICD-10-CM | POA: Diagnosis not present

## 2023-12-26 NOTE — Patient Instructions (Signed)
 Please stop at the lab.  Continue vitamin D 3000 units daily.  Continue Prolia.  You should have an endocrinology follow-up appointment in 1 year.

## 2023-12-27 ENCOUNTER — Encounter: Payer: Self-pay | Admitting: Internal Medicine

## 2023-12-27 LAB — VITAMIN D 25 HYDROXY (VIT D DEFICIENCY, FRACTURES): Vit D, 25-Hydroxy: 57 ng/mL (ref 30–100)

## 2024-02-28 NOTE — Telephone Encounter (Signed)
 Prolia  VOB initiated via [email or web address]  Next Prolia  inj DUE: 03/29/24

## 2024-03-10 NOTE — Telephone Encounter (Signed)
 Medical Buy and Bill  Prior Authorization REQUIRED for [employer]   PA PROCESS DETAILS: PA is required. PA can be initiated by calling [phone] or online at  [email or web address].

## 2024-03-12 NOTE — Telephone Encounter (Signed)
 Prior Authorization initiated for via [email or web address] KEY: [id number]   Pending review

## 2024-03-15 NOTE — Telephone Encounter (Signed)
 Medical Buy and Raenette Bumps  Patient is ready for scheduling on or after 03/29/24  Out-of-pocket cost due at time of visit: $356.87   Primary: [employer] Advantage Gold Plus Prolia  co-insurance: 20% (approximately $331.87) Admin fee co-insurance: 20% (approximately $25)  Deductible: does not apply  Prior Auth: APPROVED PA# [id number] Valid: 03/14/23-10/22/24  Secondary: N/A Prolia  co-insurance:  Admin fee co-insurance:  Deductible:  Prior Auth:  PA# Valid:   ** This summary of benefits is an estimation of the patient's out-of-pocket cost. Exact cost may vary based on individual plan coverage.

## 2024-03-15 NOTE — Telephone Encounter (Signed)
 Medical Buy and Raenette Bumps  Prior Authorization for PROLIA  APPROVED PA# [id number] Valid: 03/14/23-10/22/24

## 2024-04-01 ENCOUNTER — Ambulatory Visit

## 2024-04-01 DIAGNOSIS — M818 Other osteoporosis without current pathological fracture: Secondary | ICD-10-CM

## 2024-04-01 MED ORDER — DENOSUMAB 60 MG/ML ~~LOC~~ SOSY
60.0000 mg | PREFILLED_SYRINGE | Freq: Once | SUBCUTANEOUS | Status: AC
  Administered 2024-10-10: 60 mg via SUBCUTANEOUS

## 2024-04-01 NOTE — Progress Notes (Signed)
 Medication: Prolia  Dosage:60 mg Location: R arm Injection given WE:[id number] Provider: Linford Ribas

## 2024-04-09 NOTE — Telephone Encounter (Signed)
 Last Prolia  inj 04/01/24 Next Prolia  inj due 10/02/24

## 2024-04-09 NOTE — Telephone Encounter (Signed)
 Medical Buy and Raenette Bumps  Patient is ready for scheduling on or after 03/29/24  Out-of-pocket cost due at time of visit: $356.87   Primary: [employer]vantage  Prolia  co-insurance: 20% (approximately $331.87) Admin fee co-insurance: 20% (approximately $25)  Deductible: does not apply  Prior Auth: APPROVED PA# [id number] Valid: 03/14/23-10/22/24  Secondary: N/A Prolia  co-insurance:  Admin fee co-insurance:  Deductible:  Prior Auth:  PA# Valid:   ** This summary of benefits is an estimation of the patient's out-of-pocket cost. Exact cost may vary based on individual plan coverage.

## 2024-05-13 ENCOUNTER — Ambulatory Visit (INDEPENDENT_AMBULATORY_CARE_PROVIDER_SITE_OTHER): Payer: Medicare HMO

## 2024-05-13 VITALS — Ht 63.0 in | Wt 173.0 lb

## 2024-05-13 DIAGNOSIS — Z0000 Encounter for general adult medical examination without abnormal findings: Secondary | ICD-10-CM | POA: Diagnosis not present

## 2024-05-13 DIAGNOSIS — M8589 Other specified disorders of bone density and structure, multiple sites: Secondary | ICD-10-CM

## 2024-05-13 NOTE — Progress Notes (Signed)
 Please attest this visit in the absence of patient primary care provider.    Subjective:   Erica Navarro is a 73 y.o. who presents for a Medicare Wellness preventive visit.  As a reminder, Annual Wellness Visits don't include a physical exam, and some assessments may be limited, especially if this visit is performed virtually. We may recommend an in-person follow-up visit with your provider if needed.  Visit Complete: Virtual I connected with  Erica Navarro on 05/13/24 by a audio enabled telemedicine application and verified that I am speaking with the correct person using two identifiers.  Patient Location: Home  Provider Location: Office/Clinic  I discussed the limitations of evaluation and management by telemedicine. The patient expressed understanding and agreed to proceed.  Vital Signs: Because this visit was a virtual/telehealth visit, some criteria may be missing or patient reported. Any vitals not documented were not able to be obtained and vitals that have been documented are patient reported.  VideoDeclined- This patient declined Librarian, academic. Therefore the visit was completed with audio only.  Persons Participating in Visit: Patient.  AWV Questionnaire: Yes: Patient Medicare AWV questionnaire was completed by the patient on 05/07/24; I have confirmed that all information answered by patient is correct and no changes since this date.  Cardiac Risk Factors include: advanced age (>92men, >19 women);dyslipidemia;hypertension     Objective:    Today's Vitals   05/13/24 1424  Weight: 173 lb (78.5 kg)  Height: 5' 3 (1.6 m)   Body mass index is 30.65 kg/m.     05/13/2024    2:39 PM 05/27/2023    9:15 AM 05/08/2023    3:10 PM 05/02/2022    1:02 PM 03/29/2022    1:17 PM 04/20/2021   12:58 PM 03/28/2019    8:18 AM  Advanced Directives  Does Patient Have a Medical Advance Directive? No No No No No No No  Would patient like information on  creating a medical advance directive? Yes (MAU/Ambulatory/Procedural Areas - Information given) No - Patient declined No - Patient declined No - Patient declined  Yes (MAU/Ambulatory/Procedural Areas - Information given) No - Patient declined      Data saved with a previous flowsheet row definition    Current Medications (verified) Outpatient Encounter Medications as of 05/13/2024  Medication Sig   acetaminophen  (TYLENOL ) 500 MG tablet Take 1,000 mg by mouth every 6 (six) hours as needed (pain.).   amLODipine  (NORVASC ) 10 MG tablet TAKE 1 TABLET BY MOUTH EVERY DAY   aspirin 81 MG EC tablet Take 81 mg by mouth in the morning.   Cholecalciferol (VITAMIN D3) 50 MCG (2000 UT) TABS Take 2,000 Units by mouth in the morning.   Cholecalciferol 1000 UNITS capsule Take 1,000 Units by mouth in the morning.   cyanocobalamin  (VITAMIN B12) 1000 MCG tablet Take 1,000 mcg by mouth daily.   denosumab  (PROLIA ) 60 MG/ML SOSY injection Inject 60 mg into the skin every 6 (six) months.   simvastatin  (ZOCOR ) 10 MG tablet TAKE 1 TABLET BY MOUTH EVERYDAY AT BEDTIME   [DISCONTINUED] celecoxib  (CELEBREX ) 100 MG capsule Take 1 capsule (100 mg total) by mouth 2 (two) times daily as needed. (Patient not taking: Reported on 05/13/2024)   [DISCONTINUED] cyclobenzaprine  (FLEXERIL ) 5 MG tablet Take 1-2 tablets (5-10 mg total) by mouth 3 (three) times daily as needed for muscle spasms. (Patient not taking: Reported on 12/26/2023)   Facility-Administered Encounter Medications as of 05/13/2024  Medication   [START ON 10/01/2024] denosumab  (PROLIA )  injection 60 mg    Allergies (verified) Latex, Pravachol  [pravastatin ], and Zestril [lisinopril]   History: Past Medical History:  Diagnosis Date   Arthritis    Hyperlipidemia    Hypertension    Hyperthyroidism    Past Surgical History:  Procedure Laterality Date   COLONOSCOPY  08/10/2019   PARATHYROIDECTOMY Left 03/28/2019   Procedure: LEFT PARATHYROIDECTOMY;  Surgeon: Eletha Boas, MD;  Location: [facility];  Service: General;  Laterality: Left;   PARATHYROIDECTOMY Right 04/06/2022   Procedure: RIGHT INFERIOR PARATHYROIDECTOMY;  Surgeon: Eletha Boas, MD;  Location: [facility];  Service: General;  Laterality: Right;   POLYPECTOMY     Family History  Problem Relation Age of Onset   Heart attack Mother    Hypertension Sister    Hypertension Sister    Heart attack Brother    Heart attack Brother    Colon cancer Neg Hx    Colon polyps Neg Hx    Esophageal cancer Neg Hx    Rectal cancer Neg Hx    Stomach cancer Neg Hx    Social History   Socioeconomic History   Marital status: Widowed    Spouse name: Not on file   Number of children: Not on file   Years of education: Not on file   Highest education level: 12th grade  Occupational History   Occupation: part time    Comment: janitorial  Tobacco Use   Smoking status: Never   Smokeless tobacco: Never  Vaping Use   Vaping status: Never Used  Substance and Sexual Activity   Alcohol use: No   Drug use: No   Sexual activity: Never    Birth control/protection: Abstinence  Other Topics Concern   Not on file  Social History Narrative   Not on file   Social Drivers of Health   Financial Resource Strain: Low Risk  (05/13/2024)   Overall Financial Resource Strain (CARDIA)    Difficulty of Paying Living Expenses: Not hard at all  Food Insecurity: No Food Insecurity (05/13/2024)   Hunger Vital Sign    Worried About Running Out of Food in the Last Year: Never true    Ran Out of Food in the Last Year: Never true  Transportation Needs: No Transportation Needs (05/13/2024)   PRAPARE - Administrator, Civil Service (Medical): No    Lack of Transportation (Non-Medical): No  Physical Activity: Insufficiently Active (05/13/2024)   Exercise Vital Sign    Days of Exercise per Week: 3 days    Minutes of Exercise per Session: 20 min  Stress: No Stress Concern Present (05/13/2024)   [employer] of  Occupational Health - Occupational Stress Questionnaire    Feeling of Stress: Not at all  Social Connections: Moderately Isolated (05/13/2024)   Social Connection and Isolation Panel    Frequency of Communication with Friends and Family: More than three times a week    Frequency of Social Gatherings with Friends and Family: Once a week    Attends Religious Services: More than 4 times per year    Active Member of [employer] or Organizations: No    Attends Banker Meetings: Never    Marital Status: Widowed    Tobacco Counseling Counseling given: Not Answered    Clinical Intake:  Pre-visit preparation completed: Yes  Pain : No/denies pain     BMI - recorded: 30.65 Nutritional Status: BMI > 30  Obese Nutritional Risks: None Diabetes: No  Lab Results  Component Value Date  HGBA1C 5.8 06/11/2023   HGBA1C 5.5 12/19/2021   HGBA1C 5.4 12/08/2020     How often do you need to have someone help you when you read instructions, pamphlets, or other written materials from your doctor or pharmacy?: 1 - Never What is the last grade level you completed in school?: 12  Interpreter Needed?: No  Information entered by :: Lolita Libra, CMA   Activities of Daily Living     05/07/2024    4:49 PM  In your present state of health, do you have any difficulty performing the following activities:  Hearing? 0  Vision? 0  Difficulty concentrating or making decisions? 0  Walking or climbing stairs? 0  Dressing or bathing? 0  Doing errands, shopping? 0  Preparing Food and eating ? N  Using the Toilet? N  In the past six months, have you accidently leaked urine? N  Do you have problems with loss of bowel control? N  Managing your Medications? N  Managing your Finances? N  Housekeeping or managing your Housekeeping? N    Patient Care Team: Copland, Harlene BROCKS, MD as PCP - General (Family Medicine) Trixie File, MD as Consulting Physician (Internal Medicine)  I have  updated your Care Teams any recent Medical Services you may have received from other providers in the past year.     Assessment:   This is a routine wellness examination for Karynn.  Hearing/Vision screen Hearing Screening - Comments:: Denies hearing difficulties.  Vision Screening - Comments:: Doesn't have eye exams, will send recommendations via mychart   Goals Addressed               This Visit's Progress     Exercise 3x per week (30 min per time) (pt-stated)   Not on track     Be more consistent with exercise       Depression Screen     05/13/2024    2:34 PM 05/08/2023    3:07 PM 06/28/2022    8:14 AM 05/02/2022    1:03 PM 04/20/2021    1:01 PM 04/30/2017    8:19 AM 11/29/2016   10:15 AM  PHQ 2/9 Scores  PHQ - 2 Score 0 0 0 1 0 0 0  PHQ- 9 Score 0          Fall Risk     05/07/2024    4:49 PM 04/27/2023    9:05 AM 06/28/2022    8:14 AM 05/02/2022    1:02 PM 04/20/2021    1:00 PM  Fall Risk   Falls in the past year? 0 0 0 0 0  Number falls in past yr: 0 0 0 0 0  Injury with Fall? 0 0 0 0 0  Risk for fall due to :  No Fall Risks  No Fall Risks   Follow up Education provided Falls prevention discussed  Falls evaluation completed  Falls prevention discussed      Data saved with a previous flowsheet row definition    MEDICARE RISK AT HOME:  Medicare Risk at Home Any stairs in or around the home?: (Patient-Rptd) No If so, are there any without handrails?: (Patient-Rptd) No Home free of loose throw rugs in walkways, pet beds, electrical cords, etc?: (Patient-Rptd) Yes Adequate lighting in your home to reduce risk of falls?: (Patient-Rptd) Yes Life alert?: (Patient-Rptd) No Use of a cane, walker or w/c?: (Patient-Rptd) No Grab bars in the bathroom?: (Patient-Rptd) No Shower chair or bench in shower?: (Patient-Rptd) No Elevated toilet  seat or a handicapped toilet?: (Patient-Rptd) No  TIMED UP AND GO:  Was the test performed?  No,audio  Cognitive Function: 6CIT  completed    04/30/2017    8:20 AM  MMSE - Mini Mental State Exam  Orientation to time 5   Orientation to Place 5   Registration 3   Attention/ Calculation 5   Recall 3   Language- name 2 objects 2   Language- repeat 1  Language- follow 3 step command 3   Language- read & follow direction 1   Write a sentence 1   Copy design 1   Total score 30      Data saved with a previous flowsheet row definition        05/13/2024    2:39 PM 05/08/2023    3:11 PM 05/02/2022    1:11 PM  6CIT Screen  What Year? 0 points 0 points 0 points  What month? 0 points 0 points 0 points  What time? 0 points 0 points 0 points  Count back from 20 0 points 0 points 0 points  Months in reverse 0 points 0 points 0 points  Repeat phrase 2 points 0 points 0 points  Total Score 2 points 0 points 0 points    Immunizations Immunization History  Administered Date(s) Administered   Fluad Quad(high Dose 65+) 12/08/2020, 12/19/2021   Influenza, High Dose Seasonal PF 12/02/2018   PFIZER Comirnaty(Gray Top)Covid-19 Tri-Sucrose Vaccine 04/24/2020, 05/22/2020, 11/11/2020, 11/27/2020   Pfizer Covid-19 Vaccine Bivalent Booster 39yrs & up 11/27/2020   Pneumococcal Conjugate-13 05/17/2015   Pneumococcal Polysaccharide-23 10/30/2016   Zoster, Live 10/30/2016    Screening Tests Health Maintenance  Topic Date Due   DTaP/Tdap/Td (1 - Tdap) Never done   Zoster Vaccines- Shingrix (1 of 2) 08/24/1999   Medicare Annual Wellness (AWV)  05/07/2024   COVID-19 Vaccine (6 - 2024-25 season) 05/13/2025 (Originally 06/24/2023)   INFLUENZA VACCINE  05/23/2024   DEXA SCAN  07/17/2024   MAMMOGRAM  10/03/2024   Colonoscopy  07/20/2026   Pneumococcal Vaccine: 50+ Years  Completed   Hepatitis C Screening  Completed   Hepatitis B Vaccines  Aged Out   HPV VACCINES  Aged Out   Meningococcal B Vaccine  Aged Out    Health Maintenance  Health Maintenance Due  Topic Date Due   DTaP/Tdap/Td (1 - Tdap) Never done   Zoster  Vaccines- Shingrix (1 of 2) 08/24/1999   Medicare Annual Wellness (AWV)  05/07/2024   Health Maintenance Items Addressed: Will consider Tetanus and shingrix at pharmacy. Bone Density order placed.  Additional Screening:  Vision Screening: Recommended annual ophthalmology exams for early detection of glaucoma and other disorders of the eye. Would you like a referral to an eye doctor? Providing list of Eye doctors for pt to choose.   Dental Screening: Recommended annual dental exams for proper oral hygiene  Community Resource Referral / Chronic Care Management: CRR required this visit?  No   CCM required this visit?  No   Plan:    I have personally reviewed and noted the following in the patient's chart:   Medical and social history Use of alcohol, tobacco or illicit drugs  Current medications and supplements including opioid prescriptions. Patient is not currently taking opioid prescriptions. Functional ability and status Nutritional status Physical activity Advanced directives List of other physicians Hospitalizations, surgeries, and ER visits in previous 12 months Vitals Screenings to include cognitive, depression, and falls Referrals and appointments  In addition, I have reviewed  and discussed with patient certain preventive protocols, quality metrics, and best practice recommendations. A written personalized care plan for preventive services as well as general preventive health recommendations were provided to patient.   Lolita Libra, CMA   05/13/2024   After Visit Summary: (MyChart) Due to this being a telephonic visit, the after visit summary with patients personalized plan was offered to patient via MyChart   Notes: Nothing significant to report at this time.

## 2024-05-13 NOTE — Patient Instructions (Addendum)
 Ms. Gaulin , Thank you for taking time out of your busy schedule to complete your Annual Wellness Visit with me. I enjoyed our conversation and look forward to speaking with you again next year. I, as well as your care team,  appreciate your ongoing commitment to your health goals. Please review the following plan we discussed and let me know if I can assist you in the future. Your Game plan/ To Do List        Referrals: Bone Density (due 07/18/24):  [phone] please call this number if you haven't been scheduled by the above date.  Follow up Visits: Next Medicare AWV with our clinical staff: 05/14/25 2:20pm    Next Office Visit with your provider: 06/16/24 2:20pm  Clinician Recommendations:  Aim for 30 minutes of exercise or brisk walking, 6-8 glasses of water, and 5 servings of fruits and vegetables each  day.   You will need to get the following vaccines at your local pharmacy: tetanus, shingrix     This is a list of the screening recommended for you and due dates:  Health Maintenance  Topic Date Due   DTaP/Tdap/Td vaccine (1 - Tdap) Never done   Zoster (Shingles) Vaccine (1 of 2) 08/24/1999   Medicare Annual Wellness Visit  05/07/2024   COVID-19 Vaccine (6 - 2024-25 season) 05/13/2025*   Flu Shot  05/23/2024   DEXA scan (bone density measurement)  07/17/2024   Mammogram  10/03/2024   Colon Cancer Screening  07/20/2026   Pneumococcal Vaccine for age over 82  Completed   Hepatitis C Screening  Completed   Hepatitis B Vaccine  Aged Out   HPV Vaccine  Aged Out   Meningitis B Vaccine  Aged Out  *Topic was postponed. The date shown is not the original due date.    Advanced directives: (Copy Requested) Please bring a copy of your health care power of attorney and living will to the office to be added to your chart at your convenience. You can mail to [facility] [address] [place], [place] [address] or email to [email or web address] Advance Care Planning  is important because it:  [x]  Makes sure you receive the medical care that is consistent with your values, goals, and preferences  [x]  It provides guidance to your family and loved ones and reduces their decisional burden about whether or not they are making the right decisions based on your wishes.  Follow the link provided in your after visit summary or read over the paperwork we have mailed to you to help you started getting your Advance Directives in place. If you need assistance in completing these, please reach out to us  so that we can help you!  See attachments for Preventive Care and Fall Prevention Tips. Please see the  list of Eye Doctors below to choose for your next exam.  [facility]     [address] [place]WE[place] PARAS,  [address]     [place], [place] [address] [place], [place] [address]    Phone:  [phone] Phone:  [phone]    Fax:  [phone] Fax:  [phone]     [facility] [address]      [address] JE[place] BROCKS  [place], [place] [address]    [place], [place] [address] Phone:  [phone]    Phone: [phone] Fax:  [phone]     Fax: [phone]  [facility])   [address]  [place], [place] [address] Phone:  [phone] Fax:  [phone]  [facility] [address] [place]  [place], [place] [address] Phone:  [phone] Fax:  [phone]  [facility] [address] [place],  [place], [place] [address] Phone:  [phone] Fax:  [phone]  [facility] [address],  [place], [place] [address] Phone:  [phone] Fax:  [phone]  [facility] [address] [place],  [place], [place] [address] Phone:  [phone] Fax:  [phone]  [facility] [address] [place],  [place], [place] [address] Phone:  [phone] / Fax:  [phone]

## 2024-06-14 DIAGNOSIS — E538 Deficiency of other specified B group vitamins: Secondary | ICD-10-CM | POA: Insufficient documentation

## 2024-06-14 NOTE — Progress Notes (Addendum)
 [facility] [address] [place], [place] [address] [phone]  Date:  06/16/2024   Name:  Erica Navarro   DOB:  Jun 19, 1951   MRN:  [id number]  PCP:  Watt Harlene BROCKS, MD    Chief Complaint: Follow-up (No concerns )   History of Present Illness:  Erica Navarro is a 73 y.o. very pleasant female patient who presents with the following:  Patient seen today for periodic follow-up.  I last saw her in February of this year -history of hypertension, hyperparathyroidism status post parathyroidectomy x 2, vitamin D  deficiency, thyroid  nodule, hyperlipidemia, osteoporosis  At our visit in February we diagnosed a B12 deficiency  She saw her endocrinologist, Dr. Trixie in March -at that time noted normal calcium  level, using over-the-counter vitamin D  3000 IU daily, Prolia  Plan to repeat thyroid  ultrasound in 1 year  Recommend Shingrix Recommend flu shot and COVID booster this fall Recommend 1 dose of RSV this fall season  Amlodipine  10 Aspirin 81 Vitamin D , B12 Prolia  Simvastatin   DEXA scan can be repeated this September- already scheduled  Mammogram 12/24 Colon cancer screening September 2020- 7 year recheck needed   BP Readings from Last 3 Encounters:  06/16/24 (!) 150/85  12/26/23 130/70  12/17/23 (!) 144/84   Discussed the use of AI scribe software for clinical note transcription with the patient, who gave verbal consent to proceed.  History of Present Illness Erica Navarro is a 73 year old female who presents for follow-up on her calcium  levels and joint pain management.  She has undergone two parathyroidectomies and is currently being monitored for her calcium  levels. She is taking vitamin D  and Prolia , with a thyroid  ultrasound scheduled for October 1st.  She experiences joint pain, primarily in her arms and occasionally in her legs. The pain in her legs has improved, with less swelling noted. The pain is  intermittent and managed with Tylenol , which she took last week when the pain worsened. The pain is exacerbated by physical activity, particularly after working on a Sunday. No significant swelling in the knees currently.  She is currently taking amlodipine  for blood pressure management and simvastatin  for cholesterol. She also takes B12 supplements. She has a history of elevated blood pressure readings and has a blood pressure cuff at home for monitoring.  She underwent colon cancer screening in September 2020, which revealed a few polyps, and she is not due for another screening for seven years.  She works in [employer].    Patient Active Problem List   Diagnosis Date Noted   B12 deficiency 06/14/2024   Right thyroid  nodule 06/13/2019   Ventral hernia without obstruction or gangrene 10/30/2016   Sedentary lifestyle 10/30/2016   Osteoporosis 06/02/2016   Vitamin D  insufficiency 06/02/2016   Hyperparathyroidism, primary (HCC) 06/04/2015   HTN (hypertension) 11/11/2012   High cholesterol 11/11/2012    Past Medical History:  Diagnosis Date   Arthritis    Hyperlipidemia    Hypertension    Hyperthyroidism     Past Surgical History:  Procedure Laterality Date   COLONOSCOPY  08/10/2019   PARATHYROIDECTOMY Left 03/28/2019   Procedure: LEFT PARATHYROIDECTOMY;  Surgeon: Eletha Boas, MD;  Location: [facility];  Service: General;  Laterality: Left;   PARATHYROIDECTOMY Right 04/06/2022   Procedure: RIGHT INFERIOR PARATHYROIDECTOMY;  Surgeon: Eletha Boas, MD;  Location: [facility];  Service: General;  Laterality: Right;   POLYPECTOMY      Social History  Tobacco Use   Smoking status: Never   Smokeless tobacco: Never  Vaping Use   Vaping status: Never Used  Substance Use Topics   Alcohol use: No   Drug use: No    Family History  Problem Relation Age of Onset   Heart attack Mother    Hypertension Sister    Hypertension Sister    Heart attack Brother    Heart attack  Brother    Colon cancer Neg Hx    Colon polyps Neg Hx    Esophageal cancer Neg Hx    Rectal cancer Neg Hx    Stomach cancer Neg Hx     Allergies  Allergen Reactions   Latex Itching   Pravachol  [Pravastatin ]     Body aches    Zestril [Lisinopril] Swelling    hands and face swell    Medication list has been reviewed and updated.  Current Outpatient Medications on File Prior to Visit  Medication Sig Dispense Refill   acetaminophen  (TYLENOL ) 500 MG tablet Take 1,000 mg by mouth every 6 (six) hours as needed (pain.).     aspirin 81 MG EC tablet Take 81 mg by mouth in the morning.     Cholecalciferol (VITAMIN D3) 50 MCG (2000 UT) TABS Take 2,000 Units by mouth in the morning.     Cholecalciferol 1000 UNITS capsule Take 1,000 Units by mouth in the morning.     cyanocobalamin  (VITAMIN B12) 1000 MCG tablet Take 1,000 mcg by mouth daily.     denosumab  (PROLIA ) 60 MG/ML SOSY injection Inject 60 mg into the skin every 6 (six) months.     simvastatin  (ZOCOR ) 10 MG tablet TAKE 1 TABLET BY MOUTH EVERYDAY AT BEDTIME 90 tablet 2   Current Facility-Administered Medications on File Prior to Visit  Medication Dose Route Frequency Provider Last Rate Last Admin   [START ON 10/01/2024] denosumab  (PROLIA ) injection 60 mg  60 mg Subcutaneous Once Gherghe, Cristina, MD        Review of Systems:  As per HPI- otherwise negative.   Physical Examination: Vitals:   06/16/24 1400 06/16/24 1416  BP: (!) 144/82 (!) 150/85  Pulse: 81   Temp: 98.6 F (37 C)   SpO2: 94%    Vitals:   06/16/24 1400  Weight: 174 lb 9.6 oz (79.2 kg)  Height: 5' 3 (1.6 m)   Body mass index is 30.93 kg/m. Ideal Body Weight: Weight in (lb) to have BMI = 25: 140.8  GEN: no acute distress.  Mildly obese, looks well HEENT: Atraumatic, Normocephalic.  Bilateral TM wnl, oropharynx normal.  PEERL,EOMI.   Ears and Nose: No external deformity. CV: RRR, No M/G/R. No JVD. No thrill. No extra heart sounds. PULM: CTA B, no  wheezes, crackles, rhonchi. No retractions. No resp. distress. No accessory muscle use. ABD: S, NT, ND, +BS. No rebound. No HSM.  Significant, stable ventral hernia is present EXTR: No c/c/e PSYCH: Normally interactive. Conversant.    Assessment and Plan: B12 deficiency - Plan: Vitamin B12  Primary hypertension - Plan: CBC, Comprehensive metabolic panel with GFR  Hyperparathyroidism, primary (HCC) - Plan: Comprehensive metabolic panel with GFR  Other osteoporosis without current pathological fracture  High cholesterol - Plan: Lipid panel  Vitamin D  insufficiency  Screening for diabetes mellitus - Plan: Hemoglobin A1c  Essential hypertension - Plan: amLODipine  (NORVASC ) 10 MG tablet  Assessment & Plan Essential hypertension Blood pressure slightly elevated. - Refill amlodipine  prescription at [facility] in [place]. - Monitor blood pressure at home and  report readings.  Osteoporosis, status post parathyroidectomy for primary hyperparathyroidism On Prolia  and vitamin D  supplementation. Calcium  levels monitored by Dr. Sanjuan. - Scheduled for thyroid  ultrasound on October 1st.  Vitamin D  deficiency and B12 deficiency on supplementation Taking vitamin D  and B12 supplements. - Monitor effectiveness of B12 supplementation.  Can change over to shots if her level has not come up  Chronic joint pain (shoulders, arms, knees, legs) Intermittent joint pain managed with Tylenol  as needed. Pain exacerbated by overexertion. - Continue Tylenol  as needed for pain.  Reassured her Tylenol  is safe to use as directed  Hypercholesterolemia On simvastatin  for cholesterol management.  Abdominal wall hernia Occasional discomfort from ventral hernia. - No immediate surgical intervention planned.  Plan for 94-month follow-up Signed Harlene Schroeder, MD  Received her labs 8/26- message to pt  Results for orders placed or performed in visit on 06/16/24  CBC   Collection Time: 06/16/24  2:22 PM   Result Value Ref Range   WBC 5.8 4.0 - 10.5 K/uL   RBC 4.53 3.87 - 5.11 Mil/uL   Platelets 347.0 150.0 - 400.0 K/uL   Hemoglobin 12.8 12.0 - 15.0 g/dL   HCT 61.2 63.9 - 53.9 %   MCV 85.5 78.0 - 100.0 fl   MCHC 33.2 30.0 - 36.0 g/dL   RDW 85.1 88.4 - 84.4 %  Comprehensive metabolic panel with GFR   Collection Time: 06/16/24  2:22 PM  Result Value Ref Range   Sodium 142 135 - 145 mEq/L   Potassium 4.1 3.5 - 5.1 mEq/L   Chloride 102 96 - 112 mEq/L   CO2 29 19 - 32 mEq/L   Glucose, Bld 97 70 - 99 mg/dL   BUN 14 6 - 23 mg/dL   Creatinine, Ser 9.46 0.40 - 1.20 mg/dL   Total Bilirubin 0.3 0.2 - 1.2 mg/dL   Alkaline Phosphatase 68 39 - 117 U/L   AST 15 0 - 37 U/L   ALT 9 0 - 35 U/L   Total Protein 7.5 6.0 - 8.3 g/dL   Albumin 4.1 3.5 - 5.2 g/dL   GFR 09.14 >39.99 mL/min   Calcium  9.3 8.4 - 10.5 mg/dL  Lipid panel   Collection Time: 06/16/24  2:22 PM  Result Value Ref Range   Cholesterol 223 (H) 0 - 200 mg/dL   Triglycerides 899.9 0.0 - 149.0 mg/dL   HDL 42.59 >60.99 mg/dL   VLDL 79.9 0.0 - 59.9 mg/dL   LDL Cholesterol 854 (H) 0 - 99 mg/dL   Total CHOL/HDL Ratio 4    NonHDL 165.12   Hemoglobin A1c   Collection Time: 06/16/24  2:22 PM  Result Value Ref Range   Hgb A1c MFr Bld 5.9 4.6 - 6.5 %  Vitamin B12   Collection Time: 06/16/24  2:22 PM  Result Value Ref Range   Vitamin B-12 1,061 (H) 211 - 911 pg/mL

## 2024-06-14 NOTE — Patient Instructions (Incomplete)
 It was good to see you again today, I will be in touch with your labs Recommend that you get the updated shingles vaccine series, Shingrix.  Also recommend a flu shot this fall, COVID booster, and 1 dose of RSV vaccine  Please keep an eye on your BP at home- if generally higher than 140/85 over the next couple of weeks we will want to adjust your medication   Otherwise please see me in about 6 months

## 2024-06-16 ENCOUNTER — Encounter: Payer: Self-pay | Admitting: Family Medicine

## 2024-06-16 ENCOUNTER — Ambulatory Visit (INDEPENDENT_AMBULATORY_CARE_PROVIDER_SITE_OTHER): Payer: Medicare HMO | Admitting: Family Medicine

## 2024-06-16 VITALS — BP 150/85 | HR 81 | Temp 98.6°F | Ht 63.0 in | Wt 174.6 lb

## 2024-06-16 DIAGNOSIS — E7800 Pure hypercholesterolemia, unspecified: Secondary | ICD-10-CM

## 2024-06-16 DIAGNOSIS — E538 Deficiency of other specified B group vitamins: Secondary | ICD-10-CM | POA: Diagnosis not present

## 2024-06-16 DIAGNOSIS — E559 Vitamin D deficiency, unspecified: Secondary | ICD-10-CM | POA: Diagnosis not present

## 2024-06-16 DIAGNOSIS — Z131 Encounter for screening for diabetes mellitus: Secondary | ICD-10-CM

## 2024-06-16 DIAGNOSIS — M818 Other osteoporosis without current pathological fracture: Secondary | ICD-10-CM | POA: Diagnosis not present

## 2024-06-16 DIAGNOSIS — I10 Essential (primary) hypertension: Secondary | ICD-10-CM | POA: Diagnosis not present

## 2024-06-16 DIAGNOSIS — E210 Primary hyperparathyroidism: Secondary | ICD-10-CM | POA: Diagnosis not present

## 2024-06-16 MED ORDER — AMLODIPINE BESYLATE 10 MG PO TABS: 10.0000 mg | ORAL_TABLET | Freq: Every day | ORAL | 3 refills | Status: AC

## 2024-06-17 ENCOUNTER — Encounter: Payer: Self-pay | Admitting: Family Medicine

## 2024-06-17 LAB — CBC
HCT: 38.7 % (ref 36.0–46.0)
Hemoglobin: 12.8 g/dL (ref 12.0–15.0)
MCHC: 33.2 g/dL (ref 30.0–36.0)
MCV: 85.5 fl (ref 78.0–100.0)
Platelets: 347 K/uL (ref 150.0–400.0)
RBC: 4.53 Mil/uL (ref 3.87–5.11)
RDW: 14.8 % (ref 11.5–15.5)
WBC: 5.8 K/uL (ref 4.0–10.5)

## 2024-06-17 LAB — COMPREHENSIVE METABOLIC PANEL WITH GFR
ALT: 9 U/L (ref 0–35)
AST: 15 U/L (ref 0–37)
Albumin: 4.1 g/dL (ref 3.5–5.2)
Alkaline Phosphatase: 68 U/L (ref 39–117)
BUN: 14 mg/dL (ref 6–23)
CO2: 29 meq/L (ref 19–32)
Calcium: 9.3 mg/dL (ref 8.4–10.5)
Chloride: 102 meq/L (ref 96–112)
Creatinine, Ser: 0.53 mg/dL (ref 0.40–1.20)
GFR: 90.85 mL/min (ref >60.00)
Glucose, Bld: 97 mg/dL (ref 70–99)
Potassium: 4.1 meq/L (ref 3.5–5.1)
Sodium: 142 meq/L (ref 135–145)
Total Bilirubin: 0.3 mg/dL (ref 0.2–1.2)
Total Protein: 7.5 g/dL (ref 6.0–8.3)

## 2024-06-17 LAB — LIPID PANEL
Cholesterol: 223 mg/dL — ABNORMAL HIGH (ref 0–200)
HDL: 57.4 mg/dL (ref >39.00)
LDL Cholesterol: 145 mg/dL — ABNORMAL HIGH (ref 0–99)
NonHDL: 165.12
Total CHOL/HDL Ratio: 4
Triglycerides: 100 mg/dL (ref 0.0–149.0)
VLDL: 20 mg/dL (ref 0.0–40.0)

## 2024-06-17 LAB — HEMOGLOBIN A1C: Hgb A1c MFr Bld: 5.9 % (ref 4.6–6.5)

## 2024-06-17 LAB — VITAMIN B12: Vitamin B-12: 1061 pg/mL — ABNORMAL HIGH (ref 211–911)

## 2024-07-23 ENCOUNTER — Ambulatory Visit (HOSPITAL_BASED_OUTPATIENT_CLINIC_OR_DEPARTMENT_OTHER)
Admission: RE | Admit: 2024-07-23 | Discharge: 2024-07-23 | Disposition: A | Discharge status: Home / Self Care | Source: Ambulatory Visit | Attending: Family Medicine | Admitting: Family Medicine

## 2024-07-23 ENCOUNTER — Encounter: Payer: Self-pay | Admitting: Family Medicine

## 2024-07-23 DIAGNOSIS — Z780 Asymptomatic menopausal state: Secondary | ICD-10-CM | POA: Diagnosis not present

## 2024-07-23 DIAGNOSIS — M8589 Other specified disorders of bone density and structure, multiple sites: Secondary | ICD-10-CM | POA: Diagnosis not present

## 2024-07-23 DIAGNOSIS — M810 Age-related osteoporosis without current pathological fracture: Secondary | ICD-10-CM | POA: Diagnosis not present

## 2024-08-09 DIAGNOSIS — M4800 Spinal stenosis, site unspecified: Secondary | ICD-10-CM | POA: Diagnosis not present

## 2024-08-09 DIAGNOSIS — Z823 Family history of stroke: Secondary | ICD-10-CM | POA: Diagnosis not present

## 2024-08-09 DIAGNOSIS — Z809 Family history of malignant neoplasm, unspecified: Secondary | ICD-10-CM | POA: Diagnosis not present

## 2024-08-09 DIAGNOSIS — Z8249 Family history of ischemic heart disease and other diseases of the circulatory system: Secondary | ICD-10-CM | POA: Diagnosis not present

## 2024-08-09 DIAGNOSIS — Z7982 Long term (current) use of aspirin: Secondary | ICD-10-CM | POA: Diagnosis not present

## 2024-08-09 DIAGNOSIS — M1990 Unspecified osteoarthritis, unspecified site: Secondary | ICD-10-CM | POA: Diagnosis not present

## 2024-08-09 DIAGNOSIS — I10 Essential (primary) hypertension: Secondary | ICD-10-CM | POA: Diagnosis not present

## 2024-08-09 DIAGNOSIS — E669 Obesity, unspecified: Secondary | ICD-10-CM | POA: Diagnosis not present

## 2024-08-09 DIAGNOSIS — E785 Hyperlipidemia, unspecified: Secondary | ICD-10-CM | POA: Diagnosis not present

## 2024-08-28 ENCOUNTER — Other Ambulatory Visit: Payer: Self-pay | Admitting: Family Medicine

## 2024-08-28 DIAGNOSIS — E785 Hyperlipidemia, unspecified: Secondary | ICD-10-CM

## 2024-09-08 NOTE — Telephone Encounter (Signed)
 Prolia  VOB initiated via [email or web address]  Next Prolia  inj DUE: 10/02/24

## 2024-09-21 NOTE — Telephone Encounter (Signed)
 Medical Buy and Annette Stable - Prior Authorization REQUIRED for [employer]

## 2024-09-28 NOTE — Telephone Encounter (Signed)
 Prior Authorization initiated for PROLIA  via [email or web address] KEY: [id number]

## 2024-09-30 NOTE — Telephone Encounter (Signed)
 Medical Buy and Bill  Prior Authorization APPROVED for PROLIA  PA# [id number] Valid: 03/14/23-10/22/25

## 2024-10-05 NOTE — Telephone Encounter (Signed)
 Medical [employer] and [employer]  Patient is ready for scheduling on or after 10/02/24  Out-of-pocket cost due at time of visit: $377.56  Primary: [employer] Advantage PPO Gold Plus Prolia  co-insurance: 20% (approximately $331.87) Admin fee co-insurance: 20% (approximately $25)  Deductible: does not apply  Prior Auth: APPROVED PA# [id number] Valid: 03/14/23-10/22/25  Secondary: N/A Prolia  co-insurance:  Admin fee co-insurance:  Deductible:  Prior Auth:  PA# Valid:   ** This summary of benefits is an estimation of the patient's out-of-pocket cost. Exact cost may vary based on individual plan coverage.

## 2024-10-10 ENCOUNTER — Ambulatory Visit

## 2024-10-10 DIAGNOSIS — M818 Other osteoporosis without current pathological fracture: Secondary | ICD-10-CM

## 2024-10-10 MED ORDER — DENOSUMAB 60 MG/ML ~~LOC~~ SOSY: 60.0000 mg | PREFILLED_SYRINGE | Freq: Once | SUBCUTANEOUS | Status: AC

## 2024-10-10 NOTE — Progress Notes (Signed)
 Pt was in office today for prolia  injection given in the left arm with no problem. I advise pt to wait in lobby for 30 mins incase of a reaction.

## 2024-10-17 NOTE — Telephone Encounter (Signed)
 Last Prolia  inj 10/10/24 Next Prolia  inj due 04/11/25

## 2024-12-17 ENCOUNTER — Ambulatory Visit: Admitting: Family Medicine

## 2024-12-25 ENCOUNTER — Ambulatory Visit: Admitting: Internal Medicine

## 2025-04-17 ENCOUNTER — Ambulatory Visit

## 2025-05-14 ENCOUNTER — Ambulatory Visit
